# Patient Record
Sex: Male | Born: 1985 | Race: Black or African American | Hispanic: No | Marital: Single | Smoking: Never smoker
Health system: Southern US, Community
[De-identification: ages and names within clinical notes are randomized; demographics above are authoritative.]

## PROBLEM LIST (undated history)

## (undated) ENCOUNTER — Emergency Department (HOSPITAL_BASED_OUTPATIENT_CLINIC_OR_DEPARTMENT_OTHER): Admission: EM | Payer: BC Managed Care – PPO | Source: Home / Self Care

## (undated) DIAGNOSIS — M549 Dorsalgia, unspecified: Secondary | ICD-10-CM

## (undated) DIAGNOSIS — M542 Cervicalgia: Secondary | ICD-10-CM

## (undated) DIAGNOSIS — M2550 Pain in unspecified joint: Secondary | ICD-10-CM

## (undated) DIAGNOSIS — R200 Anesthesia of skin: Secondary | ICD-10-CM

## (undated) DIAGNOSIS — G4733 Obstructive sleep apnea (adult) (pediatric): Secondary | ICD-10-CM

## (undated) DIAGNOSIS — M6208 Separation of muscle (nontraumatic), other site: Secondary | ICD-10-CM

## (undated) DIAGNOSIS — E538 Deficiency of other specified B group vitamins: Secondary | ICD-10-CM

## (undated) DIAGNOSIS — F419 Anxiety disorder, unspecified: Secondary | ICD-10-CM

## (undated) DIAGNOSIS — R238 Other skin changes: Secondary | ICD-10-CM

## (undated) DIAGNOSIS — K219 Gastro-esophageal reflux disease without esophagitis: Secondary | ICD-10-CM

## (undated) DIAGNOSIS — R202 Paresthesia of skin: Secondary | ICD-10-CM

## (undated) DIAGNOSIS — M797 Fibromyalgia: Secondary | ICD-10-CM

## (undated) DIAGNOSIS — G894 Chronic pain syndrome: Secondary | ICD-10-CM

## (undated) DIAGNOSIS — F32A Depression, unspecified: Secondary | ICD-10-CM

## (undated) DIAGNOSIS — S93402A Sprain of unspecified ligament of left ankle, initial encounter: Secondary | ICD-10-CM

## (undated) HISTORY — DX: Depression, unspecified: F32.A

## (undated) HISTORY — DX: Sprain of unspecified ligament of left ankle, initial encounter: S93.402A

## (undated) HISTORY — DX: Gastro-esophageal reflux disease without esophagitis: K21.9

## (undated) HISTORY — PX: ESOPHAGOGASTRODUODENOSCOPY: SHX1529

## (undated) HISTORY — DX: Chronic pain syndrome: G89.4

## (undated) HISTORY — DX: Fibromyalgia: M79.7

## (undated) HISTORY — DX: Cervicalgia: M54.2

## (undated) HISTORY — DX: Obstructive sleep apnea (adult) (pediatric): G47.33

## (undated) HISTORY — DX: Separation of muscle (nontraumatic), other site: M62.08

## (undated) HISTORY — DX: Dorsalgia, unspecified: M54.9

## (undated) HISTORY — DX: Anxiety disorder, unspecified: F41.9

## (undated) HISTORY — DX: Paresthesia of skin: R20.2

## (undated) HISTORY — DX: Pain in unspecified joint: M25.50

## (undated) HISTORY — DX: Deficiency of other specified B group vitamins: E53.8

## (undated) HISTORY — DX: Other skin changes: R23.8

## (undated) HISTORY — PX: OTHER SURGICAL HISTORY: SHX169

## (undated) HISTORY — DX: Anesthesia of skin: R20.0

---

## 2009-11-15 DIAGNOSIS — B360 Pityriasis versicolor: Secondary | ICD-10-CM | POA: Insufficient documentation

## 2009-11-18 DIAGNOSIS — H6120 Impacted cerumen, unspecified ear: Secondary | ICD-10-CM | POA: Insufficient documentation

## 2009-11-30 DIAGNOSIS — E559 Vitamin D deficiency, unspecified: Secondary | ICD-10-CM | POA: Insufficient documentation

## 2012-10-11 ENCOUNTER — Encounter: Payer: Self-pay | Admitting: Family Medicine

## 2012-10-11 ENCOUNTER — Ambulatory Visit (INDEPENDENT_AMBULATORY_CARE_PROVIDER_SITE_OTHER): Payer: Self-pay | Admitting: Family Medicine

## 2012-10-11 VITALS — BP 102/76 | HR 98 | Temp 97.6°F | Ht 71.0 in | Wt 190.0 lb

## 2012-10-11 DIAGNOSIS — L989 Disorder of the skin and subcutaneous tissue, unspecified: Secondary | ICD-10-CM

## 2012-10-11 DIAGNOSIS — Z7689 Persons encountering health services in other specified circumstances: Secondary | ICD-10-CM

## 2012-10-11 DIAGNOSIS — E559 Vitamin D deficiency, unspecified: Secondary | ICD-10-CM

## 2012-10-11 DIAGNOSIS — L659 Nonscarring hair loss, unspecified: Secondary | ICD-10-CM

## 2012-10-11 DIAGNOSIS — Z7189 Other specified counseling: Secondary | ICD-10-CM

## 2012-10-11 LAB — LIPID PANEL
LDL Cholesterol: 66 mg/dL (ref 0–99)
Total CHOL/HDL Ratio: 3
Triglycerides: 68 mg/dL (ref 0.0–149.0)

## 2012-10-11 LAB — HEMOGLOBIN A1C: Hgb A1c MFr Bld: 4.6 % (ref 4.6–6.5)

## 2012-10-11 NOTE — Patient Instructions (Addendum)
-  We have ordered labs or studies at this visit. It can take up to 1-2 weeks for results and processing. We will contact you with instructions IF your results are abnormal. Normal results will be released to your [facility]. If you have not heard from [place] or can not find your results in [facility] in 2 weeks please contact our office.  -We placed a referral for you to DERMATOLOGY as discussed. It usually takes about 1-2 weeks to process and schedule this referral. If you have not heard from [place] regarding this appointment in 2 weeks please contact our office.   -PLEASE SIGN UP FOR MYCHART TODAY   We recommend the following healthy lifestyle measures: - eat a healthy diet consisting of lots of vegetables, fruits, beans, nuts, seeds, healthy meats such as white chicken and fish and whole grains.  - avoid fried foods, fast food, processed foods, sodas, red meet and other fattening foods.  - get a least 150 minutes of aerobic exercise per week.   Follow up in: schedule physical sometime in next few months

## 2012-10-11 NOTE — Progress Notes (Signed)
Chief Complaint  Patient presents with  . Establish Care    HPI:  Marcus Bullock is here to establish care. New to [place]. 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 Home Situation: lives with father Spiritual Beliefs: christian  Has the following chronic problems and concerns today:  Hair Loss: -has started to go bald a little over the last few years -father went bald early -denies: heat or cold intolerance, fevers, weight loss or change, skin issues  Lump on chest: -has been there at least 7-10 years since puberty -has not changed, occ painful, no pruritis -located on skin on L chest  Hx Vit D def: -very low in the past  Other Providers: -none  Health Maintenance: -refused flu vaccine, had tdap in 2010  ROS: See pertinent positives and negatives per HPI.  History reviewed. No pertinent past medical history.  Family History  Problem Relation Age of Onset  . Alcohol abuse Father   . Heart disease Maternal Grandmother   . Cancer Paternal Grandmother     breast    History   Social History  . Marital Status: Single    Spouse Name: N/A    Number of Children: N/A  . Years of Education: N/A   Social History Main Topics  . Smoking status: Never Smoker   . Smokeless tobacco: None  . Alcohol Use: Yes     Comment: under safe drinking levels  . Drug Use: Yes    Special: Marijuana  . Sexually Active: Yes     Comment: male partner   Other Topics Concern  . None   Social History Narrative  . None    Current outpatient prescriptions:NON FORMULARY, Tru-Athelete Creatine Supplement, Disp: , Rfl:   EXAM:  Filed Vitals:   10/11/12 0807  BP: 102/76  Pulse: 98  Temp: 97.6 F (36.4 C)    Body mass index is 26.50 kg/(m^2).  GENERAL: vitals reviewed and listed above, alert, oriented, appears well hydrated and in no acute distress  HEENT: atraumatic, conjunttiva clear, no obvious  abnormalities on inspection of external nose and ears, normal male pattern hair loss  NECK: no obvious masses on inspection  LUNGS: clear to auscultation bilaterally, no wheezes, rales or rhonchi, good air movement  CV: HRRR, no peripheral edema  SKIN: mildly hyperpigmented dense raised macules-plaques above L areola, no scalling  MS: moves all extremities without noticeable abnormality  PSYCH: pleasant and cooperative, no obvious depression or anxiety  ASSESSMENT AND PLAN:  Discussed the following assessment and plan:  1. Vitamin D deficiency  Vitamin D, 25-hydroxy  2. Establishing care with new doctor, encounter for  Lipid Panel, Hemoglobin A1c  3. Skin lesion  -likely benign  Pt would like to see derm, referral placed  4. Hair loss  -normal balding Discussed tx options, pt would like to hold of on these   -We reviewed the PMH, PSH, FH, SH, Meds and Allergies. -We provided refills for any medications we will prescribe as needed. -We addressed current concerns per orders and patient instructions. -We have asked for records for pertinent exams, studies, vaccines and notes from previous providers. -We have advised patient to follow up per instructions below. -Influenza vaccine given today  -Patient advised to return or notify a doctor immediately if symptoms worsen or persist or new concerns arise.  Patient Instructions  -We have ordered labs or studies at this visit. It can take up to 1-2 weeks  for results and processing. We will contact you with instructions IF your results are abnormal. Normal results will be released to your [facility]. If you have not heard from [place] or can not find your results in [facility] in 2 weeks please contact our office.  -We placed a referral for you to DERMATOLOGY as discussed. It usually takes about 1-2 weeks to process and schedule this referral. If you have not heard from [place] regarding this appointment in 2 weeks please contact our office.   -PLEASE  SIGN UP FOR MYCHART TODAY   We recommend the following healthy lifestyle measures: - eat a healthy diet consisting of lots of vegetables, fruits, beans, nuts, seeds, healthy meats such as white chicken and fish and whole grains.  - avoid fried foods, fast food, processed foods, sodas, red meet and other fattening foods.  - get a least 150 minutes of aerobic exercise per week.   Follow up in: schedule physical sometime in next few months      Sonia Bromell R.

## 2012-10-12 LAB — VITAMIN D 25 HYDROXY (VIT D DEFICIENCY, FRACTURES): Vit D, 25-Hydroxy: 32 ng/mL (ref 30–89)

## 2012-10-14 ENCOUNTER — Telehealth: Payer: Self-pay | Admitting: Family Medicine

## 2012-10-14 NOTE — Telephone Encounter (Signed)
Left a message for return call.  

## 2012-10-14 NOTE — Telephone Encounter (Signed)
Labs look good.

## 2012-10-14 NOTE — Telephone Encounter (Signed)
Left a detailed message for patient at cell phone number that labs look okay.

## 2012-11-12 ENCOUNTER — Encounter: Payer: Self-pay | Admitting: Family Medicine

## 2012-11-12 DIAGNOSIS — Z0289 Encounter for other administrative examinations: Secondary | ICD-10-CM

## 2012-11-12 NOTE — Progress Notes (Signed)
No show  This encounter was created in error - please disregard.

## 2012-11-22 ENCOUNTER — Encounter: Payer: Self-pay | Admitting: Family Medicine

## 2012-11-22 ENCOUNTER — Ambulatory Visit (INDEPENDENT_AMBULATORY_CARE_PROVIDER_SITE_OTHER): Payer: BC Managed Care – PPO | Admitting: Family Medicine

## 2012-11-22 VITALS — BP 122/80 | HR 98 | Temp 98.7°F | Ht 71.0 in | Wt 192.0 lb

## 2012-11-22 DIAGNOSIS — Z0000 Encounter for general adult medical examination without abnormal findings: Secondary | ICD-10-CM

## 2012-11-22 NOTE — Progress Notes (Signed)
Chief Complaint  Patient presents with  . Annual Exam  . upper rib pain and tenderness    on left side     HPI:  Here for Physical Exam:  Concerns: did a bunch of push ups last week and then had sharp pain in intercostal muscle in one point - now resolved  Diabetes/lipid screening: done recently and normal  Diet and Exercise: exercising 3-4 times per week, eating healthy  Vaccines: UTD except flu - refused  STI testing: wants this today for HIV, GC/C and RPR - does not have any concerns or symptoms put wants screening  Depression: none  Substance Abuse: see social hx  ROS: See pertinent positives and negatives per HPI. Denies: CP, SOB, DOE, dizziness, changes in bowels, dysuria, vision changes, HAs, weight loss, fevers, night sweat, bleeding  History reviewed. No pertinent past medical history.  Family History  Problem Relation Age of Onset  . Alcohol abuse Father   . Heart disease Maternal Grandmother   . Cancer Paternal Grandmother     breast    History   Social History  . Marital Status: Single    Spouse Name: N/A    Number of Children: N/A  . Years of Education: N/A   Social History Main Topics  . Smoking status: Never Smoker   . Smokeless tobacco: None  . Alcohol Use: Yes     Comment: under safe drinking levels  . Drug Use: Yes    Special: Marijuana  . Sexually Active: Yes     Comment: male partner   Other Topics Concern  . 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  GENERAL: vitals reviewed and listed above, alert, oriented, appears well hydrated and in no acute distress  HEENT: atraumatic, conjunttiva clear, no obvious  abnormalities on inspection of external nose and ears  NECK: no obvious masses on inspection  LUNGS: clear to auscultation bilaterally, no wheezes, rales or rhonchi, good air movement  CV: HRRR, no peripheral edema  GU: refused  SKIN: small hyperpigmented patch on L breast (he is seeing derm for this)  MS: moves all extremities without noticeable abnormality  PSYCH: pleasant and cooperative, no obvious depression or anxiety  ASSESSMENT AND PLAN:  Discussed the following assessment and plan:  Encounter for preventive health examination - Plan: HIV Antibody, RPR, GC/Chlamydia Amp Probe, Urine   -discussed all level A and B USPSHTF preventive measures -STI screening per labs -advised continued exercise and healthy diet -discussed safe drinking levels, substance use, safe sex -follow up in 1 year or if needed   There are no Patient Instructions on file for this visit.   Kriste Basque R.

## 2012-11-23 LAB — HIV ANTIBODY (ROUTINE TESTING W REFLEX): HIV: NONREACTIVE

## 2012-11-23 LAB — RPR

## 2012-11-24 ENCOUNTER — Telehealth: Payer: Self-pay | Admitting: Family Medicine

## 2012-11-24 NOTE — Telephone Encounter (Signed)
Labs were all negative 

## 2012-11-25 NOTE — Telephone Encounter (Signed)
Called and left a message on pt's cell phone number (on DPR) that all lab work was negative.

## 2012-12-24 ENCOUNTER — Ambulatory Visit: Payer: BC Managed Care – PPO | Admitting: Family Medicine

## 2013-01-10 ENCOUNTER — Ambulatory Visit (INDEPENDENT_AMBULATORY_CARE_PROVIDER_SITE_OTHER): Payer: BC Managed Care – PPO | Admitting: Family Medicine

## 2013-01-10 ENCOUNTER — Encounter: Payer: Self-pay | Admitting: Family Medicine

## 2013-01-10 VITALS — BP 118/78 | HR 104 | Temp 97.7°F | Wt 178.0 lb

## 2013-01-10 DIAGNOSIS — J309 Allergic rhinitis, unspecified: Secondary | ICD-10-CM

## 2013-01-10 DIAGNOSIS — J329 Chronic sinusitis, unspecified: Secondary | ICD-10-CM

## 2013-01-10 MED ORDER — FLUTICASONE PROPIONATE 50 MCG/ACT NA SUSP: 2.0000 | Freq: Every day | NASAL | Status: DC

## 2013-01-10 MED ORDER — AMOXICILLIN 875 MG PO TABS: 875.0000 mg | ORAL_TABLET | Freq: Two times a day (BID) | ORAL | Status: DC

## 2013-01-10 NOTE — Patient Instructions (Addendum)
-  take all of the antibiotic as instructed -As we discussed, we have prescribed a new medication for you at this appointment. We discussed the common and serious potential adverse effects of this medication and you can review these and more with the pharmacist when you pick up your medication.  Please follow the instructions for use carefully and notify us immediately if you have any problems taking this medication.  For allergies: -zyrtec daily -flonase 2 sprays each nostril daily for 1 month, then 1 spray each nostril daily  Follow up in 1 month or sooner if concerns

## 2013-01-10 NOTE — Progress Notes (Signed)
Chief Complaint  Patient presents with  . 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  No past medical history on file.  Family History  Problem Relation Age of Onset  . Alcohol abuse Father   . Heart disease Maternal Grandmother   . Cancer Paternal Grandmother     breast    History   Social History  . Marital Status: Single    Spouse Name: N/A    Number of Children: N/A  . Years of Education: N/A   Social History Main Topics  . Smoking status: Never Smoker   . Smokeless tobacco: None  . Alcohol Use: Yes     Comment: under safe drinking levels  . Drug Use: Yes    Special: Marijuana  . Sexually Active: Yes     Comment: male partner   Other Topics Concern  . 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  GENERAL: vitals reviewed and listed above, alert, oriented, appears well hydrated and in no acute  distress  HEENT: atraumatic, conjunttiva clear, no obvious abnormalities on inspection of external nose and ears, normal appearance of ear canals and TMs, white nasal congestion on R with narrow nasal passage from swollen pale boggy turbinates, mild post oropharyngeal erythema with PND and cobblestoning, no tonsillar edema or exudate, no sinus TTP  NECK: no obvious masses on inspection  LUNGS: clear to auscultation bilaterally, no wheezes, rales or rhonchi, good air movement  CV: HRRR, no peripheral edema  MS: moves all extremities without noticeable abnormality  PSYCH: pleasant and cooperative, no obvious depression or anxiety  ASSESSMENT AND PLAN:  Discussed the following assessment and plan:  Sinusitis - Plan: amoxicillin (AMOXIL) 875 MG tablet  Allergic rhinitis - Plan: fluticasone (FLONASE) 50 MCG/ACT nasal spray  -AR symptoms and findings on exam, also with possible secondary max sinusitis. Will tx allergies with daily zyrtec and flonase and sinusitis with amoxicillin. Follow up in one month or sooner is sinus pain persists. -Patient advised to return or notify a doctor immediately if symptoms worsen or persist or new concerns arise.  Patient Instructions  -take all of the antibiotic as instructed -As we discussed, we have prescribed a new medication for you at this appointment. We discussed the common and serious potential adverse effects of this medication and  you can review these and more with the pharmacist when you pick up your medication.  Please follow the instructions for use carefully and notify us immediately if you have any problems taking this medication.  For allergies: -zyrtec daily -flonase 2 sprays each nostril daily for 1 month, then 1 spray each nostril daily  Follow up in 1 month or sooner if concerns     Kriste Basque R.

## 2013-02-07 ENCOUNTER — Ambulatory Visit: Payer: BC Managed Care – PPO | Admitting: Family Medicine

## 2013-09-09 ENCOUNTER — Ambulatory Visit (INDEPENDENT_AMBULATORY_CARE_PROVIDER_SITE_OTHER): Payer: BC Managed Care – PPO | Admitting: Family Medicine

## 2013-09-09 ENCOUNTER — Encounter: Payer: Self-pay | Admitting: Family Medicine

## 2013-09-09 VITALS — BP 120/80 | Temp 98.0°F | Wt 185.0 lb

## 2013-09-09 DIAGNOSIS — J399 Disease of upper respiratory tract, unspecified: Secondary | ICD-10-CM

## 2013-09-09 DIAGNOSIS — L309 Dermatitis, unspecified: Secondary | ICD-10-CM

## 2013-09-09 DIAGNOSIS — J398 Other specified diseases of upper respiratory tract: Secondary | ICD-10-CM

## 2013-09-09 DIAGNOSIS — L259 Unspecified contact dermatitis, unspecified cause: Secondary | ICD-10-CM

## 2013-09-09 NOTE — Patient Instructions (Signed)
INSTRUCTIONS FOR UPPER RESPIRATORY INFECTION:  -plenty of rest and fluids  -nasal saline wash 2-3 times daily (use prepackaged nasal saline or bottled/distilled water if making your own)   -can use sinex or afrin nasal spray for drainage and nasal congestion - but do NOT use longer then 3-4 days  -can use tylenol or ibuprofen as directed for aches and sorethroat  -in the winter time, using a humidifier at night is helpful (please follow cleaning instructions)  -if you are taking a cough medication - use only as directed, may also try a teaspoon of honey to coat the throat and throat lozenges  -for sore throat, salt water gargles can help  -all hypoallergenic soaps, bodywash and detergent - CERAVE cream (in a tub) use after every shower  -take zyrtec every night  -follow up if you have fevers, facial pain, tooth pain, difficulty breathing or are worsening or not getting better in 5-7 days

## 2013-09-09 NOTE — Progress Notes (Signed)
Chief Complaint  Patient presents with  . Sinus Problem    cloudy, fatigue, congestion, no appetite, itchy     HPI:  Acute visit for:  Sinus Issues: -started Friday, not getting better -nasal congestion, sneezing, sinus pressure - not actually pain, decreased appetite, felt like low grade fever, a little lightheaded, tired, earaches bilat, body aches -denies: fever>100, SOB, NVD, Ebola or flu exposure, sore throat -thought this was allergies so took zyrtec, tylenol cold and sinus, musinex   Skin Issues: -hx of skin issues -tingling sensation in skin when exercises -tx with hypoallergenic products and zyrtec - this helped but now he is back to used scented products and has returned -skin is dry -gets worse  ROS: See pertinent positives and negatives per HPI.  No past medical history on file.  No past surgical history on file.  Family History  Problem Relation Age of Onset  . Alcohol abuse Father   . Heart disease Maternal Grandmother   . Cancer Paternal Grandmother     breast    History   Social History  . Marital Status: Single    Spouse Name: N/A    Number of Children: N/A  . Years of Education: N/A   Social History Main Topics  . Smoking status: Never Smoker   . Smokeless tobacco: None  . Alcohol Use: Yes     Comment: under safe drinking levels  . Drug Use: Yes    Special: Marijuana  . Sexual Activity: Yes     Comment: male partner   Other Topics Concern  . 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  GENERAL: vitals reviewed and listed  above, alert, oriented, appears well hydrated and in no acute distress  HEENT: atraumatic, conjunttiva clear, no obvious abnormalities on inspection of external nose and ears, normal appearance of ear canals and TMs, clear nasal congestion, mild post oropharyngeal erythema with PND, no tonsillar edema or exudate, no sinus TTP  NECK: no obvious masses on inspection  LUNGS: clear to auscultation bilaterally, no wheezes, rales or rhonchi, good air movement  CV: HRRR, no peripheral edema  MS: moves all extremities without noticeable abnormality  PSYCH: pleasant and cooperative, no obvious depression or anxiety  ASSESSMENT AND PLAN:  Discussed the following assessment and plan:  Upper respiratory disease  Eczema  -we discussed possible serious and likely etiologies, workup and treatment, treatment risks and return precautions - VURI, underlying uncontrolled allergic rhinits and eczema likely. -after this discussion, Marcus Bullock opted for treatments per instructions. -follow up advised as needed -of course, we advised Marcus Bullock  to return or notify a doctor immediately if symptoms worsen or persist or new concerns arise.  .  -Patient advised to return or notify a doctor immediately if symptoms worsen or persist or new concerns arise.  Patient Instructions  INSTRUCTIONS FOR UPPER RESPIRATORY INFECTION:  -plenty of rest and fluids  -nasal saline wash 2-3 times daily (use prepackaged nasal saline or bottled/distilled water if making your own)   -  can use sinex or afrin nasal spray for drainage and nasal congestion - but do NOT use longer then 3-4 days  -can use tylenol or ibuprofen as directed for aches and sorethroat  -in the winter time, using a humidifier at night is helpful (please follow cleaning instructions)  -if you are taking a cough medication - use only as directed, may also try a teaspoon of honey to coat the throat and throat lozenges  -for sore throat, salt water  gargles can help  -all hypoallergenic soaps, bodywash and detergent - CERAVE cream (in a tub) use after every shower  -take zyrtec every night  -follow up if you have fevers, facial pain, tooth pain, difficulty breathing or are worsening or not getting better in 5-7 days        KIM, HANNAH R.

## 2013-09-09 NOTE — Progress Notes (Signed)
Pre visit review using our clinic review tool, if applicable. No additional management support is needed unless otherwise documented below in the visit note. 

## 2013-10-13 ENCOUNTER — Encounter: Payer: BC Managed Care – PPO | Admitting: Family Medicine

## 2013-10-13 ENCOUNTER — Encounter: Payer: Self-pay | Admitting: Family Medicine

## 2013-10-13 ENCOUNTER — Ambulatory Visit (INDEPENDENT_AMBULATORY_CARE_PROVIDER_SITE_OTHER): Payer: BC Managed Care – PPO | Admitting: Family Medicine

## 2013-10-13 VITALS — BP 128/70 | HR 132 | Temp 98.7°F | Wt 190.0 lb

## 2013-10-13 DIAGNOSIS — L299 Pruritus, unspecified: Secondary | ICD-10-CM

## 2013-10-13 DIAGNOSIS — F411 Generalized anxiety disorder: Secondary | ICD-10-CM

## 2013-10-13 LAB — CBC WITH DIFFERENTIAL/PLATELET
BASOS ABS: 0 10*3/uL (ref 0.0–0.1)
Basophils Relative: 0.6 % (ref 0.0–3.0)
EOS PCT: 1.6 % (ref 0.0–5.0)
Eosinophils Absolute: 0.1 10*3/uL (ref 0.0–0.7)
HCT: 44.5 % (ref 39.0–52.0)
Hemoglobin: 14.8 g/dL (ref 13.0–17.0)
Lymphocytes Relative: 24.7 % (ref 12.0–46.0)
Lymphs Abs: 1.4 10*3/uL (ref 0.7–4.0)
MCHC: 33.2 g/dL (ref 30.0–36.0)
MCV: 89 fl (ref 78.0–100.0)
MONO ABS: 0.6 10*3/uL (ref 0.1–1.0)
Monocytes Relative: 10 % (ref 3.0–12.0)
NEUTROS PCT: 63.1 % (ref 43.0–77.0)
Neutro Abs: 3.7 10*3/uL (ref 1.4–7.7)
PLATELETS: 224 10*3/uL (ref 150.0–400.0)
RBC: 5 Mil/uL (ref 4.22–5.81)
RDW: 12.7 % (ref 11.5–14.6)
WBC: 5.8 10*3/uL (ref 4.5–10.5)

## 2013-10-13 LAB — VITAMIN B12: VITAMIN B 12: 448 pg/mL (ref 211–911)

## 2013-10-13 LAB — TSH: TSH: 1.36 u[IU]/mL (ref 0.35–5.50)

## 2013-10-13 LAB — T4, FREE: Free T4: 0.85 ng/dL (ref 0.60–1.60)

## 2013-10-13 NOTE — Progress Notes (Signed)
Pre visit review using our clinic review tool, if applicable. No additional management support is needed unless otherwise documented below in the visit note. 

## 2013-10-13 NOTE — Progress Notes (Signed)
Error   This encounter was created in error - please disregard. 

## 2013-10-13 NOTE — Progress Notes (Signed)
Chief Complaint  Patient presents with  . Pruritis    HPI:  Itchy skin: -late cancelled appt this morning -has had this condition for many years - worse in the winter -dry itchy skin especially when sweats or when anxious - saw derm and given doxepin - not workin -wants to see allergist -has gone to hypoallergenic regimen - and better, but still has a flare about 2-3 days per week for 1 hour or so -also worse with regular deodorant -does feel anxious all the time -denies fevers, malaise, weight loss, other symptoms   ROS: See pertinent positives and negatives per HPI.  No past medical history on file.  No past surgical history on file.  Family History  Problem Relation Age of Onset  . Alcohol abuse Father   . Heart disease Maternal Grandmother   . Cancer Paternal Grandmother     breast    History   Social History  . Marital Status: Single    Spouse Name: N/A    Number of Children: N/A  . Years of Education: N/A   Social History Main Topics  . Smoking status: Never Smoker   . Smokeless tobacco: None  . Alcohol Use: Yes     Comment: under safe drinking levels  . Drug Use: Yes    Special: Marijuana  . Sexual Activity: Yes     Comment: male partner   Other Topics Concern  . 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  GENERAL: vitals reviewed and listed above,  alert, oriented, appears well hydrated and in no acute distress  HEENT: atraumatic, conjunttiva clear, no obvious abnormalities on inspection of external nose and ears  NECK: no obvious masses on inspection  LUNGS: clear to auscultation bilaterally, no wheezes, rales or rhonchi, good air movement  CV: HRRR, no peripheral edema  MS: moves all extremities without noticeable abnormality  PSYCH: pleasant and cooperative, no obvious depression or anxiety  ASSESSMENT AND PLAN:  Discussed the following assessment and plan:  Itchy skin - Plan: CBC with Differential, Vitamin B12, Vitamin D, 25-hydroxy, CMP with eGFR, TSH, T4, Free  Generalized anxiety disorder  -suspect eczema and reaction to stress, possible allergies - he is going to see allergist -basic labs - but doubt systemic illness as has been going on for many years -advised cont hypoallergenic regimen since this is helpful and keep journal to look for other triggers -he is to follow up with derm -follow up with me in 2 months -if persists will perhaps try SSRI given seems to have mild GAD and stress and anxiety worsen symptoms -Patient advised to return or notify a doctor immediately if symptoms worsen or persist or new concerns arise.  There are no Patient Instructions on file for this visit.  Colin Benton R.

## 2013-10-14 ENCOUNTER — Ambulatory Visit: Payer: BC Managed Care – PPO | Admitting: Family Medicine

## 2013-10-14 LAB — COMPLETE METABOLIC PANEL WITH GFR
ALK PHOS: 52 U/L (ref 39–117)
ALT: 20 U/L (ref 0–53)
AST: 19 U/L (ref 0–37)
Albumin: 4.5 g/dL (ref 3.5–5.2)
BILIRUBIN TOTAL: 0.4 mg/dL (ref 0.3–1.2)
BUN: 9 mg/dL (ref 6–23)
CHLORIDE: 101 meq/L (ref 96–112)
CO2: 30 mEq/L (ref 19–32)
CREATININE: 1.11 mg/dL (ref 0.50–1.35)
Calcium: 10 mg/dL (ref 8.4–10.5)
GFR, Est African American: >89 mL/min
GFR, Est Non African American: >89 mL/min
Glucose, Bld: 92 mg/dL (ref 70–99)
Potassium: 4.7 mEq/L (ref 3.5–5.3)
Sodium: 137 mEq/L (ref 135–145)
Total Protein: 7.4 g/dL (ref 6.0–8.3)

## 2013-10-14 LAB — VITAMIN D 25 HYDROXY (VIT D DEFICIENCY, FRACTURES): VIT D 25 HYDROXY: 24 ng/mL — AB (ref 30–89)

## 2013-10-17 ENCOUNTER — Encounter: Payer: Self-pay | Admitting: Family Medicine

## 2013-10-17 NOTE — Progress Notes (Signed)
Received office notes from [facility] from visit on 10/15/2013.  Skin testing standard panel of enriomental allergens revelaed no sigificant results on the epicutaneous level.  Further intradermals revealed no positive reactions.  Continue Zyrtec, hydroxyzine, cerave cream, and Fluticasone, ranitidine.  Stop Doxepin.  Follow up in 3 months.

## 2014-10-01 ENCOUNTER — Other Ambulatory Visit (HOSPITAL_COMMUNITY)
Admission: RE | Admit: 2014-10-01 | Discharge: 2014-10-01 | Disposition: A | Discharge status: Home / Self Care | Payer: BC Managed Care – PPO | Source: Ambulatory Visit | Attending: Family Medicine | Admitting: Family Medicine

## 2014-10-01 ENCOUNTER — Encounter: Payer: Self-pay | Admitting: Family Medicine

## 2014-10-01 ENCOUNTER — Ambulatory Visit (INDEPENDENT_AMBULATORY_CARE_PROVIDER_SITE_OTHER): Payer: BC Managed Care – PPO | Admitting: Family Medicine

## 2014-10-01 VITALS — BP 130/78 | HR 100 | Temp 98.0°F | Ht 70.25 in | Wt 194.5 lb

## 2014-10-01 DIAGNOSIS — Z23 Encounter for immunization: Secondary | ICD-10-CM

## 2014-10-01 DIAGNOSIS — Z113 Encounter for screening for infections with a predominantly sexual mode of transmission: Secondary | ICD-10-CM

## 2014-10-01 DIAGNOSIS — Z0000 Encounter for general adult medical examination without abnormal findings: Secondary | ICD-10-CM

## 2014-10-01 NOTE — Progress Notes (Signed)
HPI:  Here for CPE:  -Concerns and/or follow up today: none  Chronic urticaria: -seeing allergist, has seen derm as well -doing better  -Diet: has improved, eating healthy  -Exercise: regular exercise - gym, cardio  -Diabetes and Dyslipidemia Screening: done  -Hx of HTN: no  -Vaccines: UTD  -sexual activity: several new male partners in the last few years, using condoms  -wants STI testing, Hep C screening (if born [id number]): yes - HIV, RPR, CG/Chlam, Trich  -FH colon or prstate ca: see FH Last colon cancer screening: n/a Last prostate ca screening: n/a  -Alcohol, Tobacco, drug use: see social history  Review of Systems - no fevers, unintentional weight loss, vision loss, hearing loss, chest pain, sob, hemoptysis, melena, hematochezia, hematuria, genital discharge, changing or concerning skin lesions, bleeding, bruising, loc, thoughts of self harm or SI  No past medical history on file.  No past surgical history on file.  Family History  Problem Relation Age of Onset  . Alcohol abuse Father   . Heart disease Maternal Grandmother   . Cancer Paternal Grandmother     breast    History   Social History  . Marital Status: Single    Spouse Name: N/A    Number of Children: N/A  . Years of Education: N/A   Social History Main Topics  . Smoking status: Never Smoker   . Smokeless tobacco: None  . Alcohol Use: Yes     Comment: under safe drinking levels  . Drug Use: Yes    Special: Marijuana  . Sexual Activity: Yes     Comment: male partner   Other Topics Concern  . 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      Home Situation: lives with father      Spiritual Beliefs: christian          Current outpatient prescriptions: cetirizine (ZYRTEC) 10 MG tablet, Take 10 mg by mouth daily., Disp: , Rfl: ;  fluticasone (FLONASE) 50 MCG/ACT nasal spray, Place 2 sprays into the nose daily., Disp: 16 g, Rfl: 6;  hydrOXYzine (ATARAX/VISTARIL) 25 MG  tablet, Take 25 mg by mouth 2 (two) times daily., Disp: , Rfl: ;  ranitidine (ZANTAC) 150 MG tablet, Take 150 mg by mouth 2 (two) times daily., Disp: , Rfl:   EXAM:  Filed Vitals:   10/01/14 0822  BP: 130/78  Pulse: 100  Temp: 98 F (36.7 C)  TempSrc: Oral  Height: 5' 10.25" (1.784 m)  Weight: 194 lb 8 oz (88.225 kg)    Estimated body mass index is 27.72 kg/(m^2) as calculated from the following:   Height as of this encounter: 5' 10.25" (1.784 m).   Weight as of this encounter: 194 lb 8 oz (88.225 kg).  GENERAL: vitals reviewed and listed below, alert, oriented, appears well hydrated and in no acute distress  HEENT: head atraumatic, PERRLA, normal appearance of eyes, ears, nose and mouth. moist mucus membranes.  NECK: supple, no masses or lymphadenopathy  LUNGS: clear to auscultation bilaterally, no rales, rhonchi or wheeze  CV: HRRR, no peripheral edema or cyanosis, normal pedal pulses  ABDOMEN: bowel sounds normal, soft, non tender to palpation, no masses, no rebound or guarding  GU: declined  RECTAL: refused  SKIN: no rash or abnormal lesions  MS: normal gait, moves all extremities normally  NEURO: CN II-XII grossly intact, normal muscle strength and sensation to light touch on extremities  PSYCH: normal affect, pleasant and cooperative  ASSESSMENT AND PLAN:  Discussed the following assessment and plan:  Visit for preventive health examination - Plan: HIV antibody (with reflex), RPR, GC/Chlamydia Amp Probe, Urine   -Discussed and advised all [place] preventive services health task force level A and B recommendations for age, sex and risks.  -Advised at least 150 minutes of exercise per week and a healthy diet low in saturated fats and sweets and consisting of fresh fruits and vegetables, lean meats such as fish and white chicken and whole grains.  -labs, studies and vaccines per orders this encounter   Patient advised to return to clinic immediately if symptoms  worsen or persist or new concerns.  Patient Instructions  BEFORE YOU LEAVE: -flu shot -labs  -We have ordered labs or studies at this visit. It can take up to 1-2 weeks for results and processing. We will contact you with instructions IF your results are abnormal. Normal results will be released to your [facility]. If you have not heard from us or can not find your results in [facility] in 2 weeks please contact our office.  -PLEASE SIGN UP FOR MYCHART TODAY   We recommend the following healthy lifestyle measures: - eat a healthy diet consisting of lots of vegetables, fruits, beans, nuts, seeds, healthy meats such as white chicken and fish and whole grains.  - avoid fried foods, fast food, processed foods, sodas, red meet and other fattening foods.  - get a least 150 minutes of aerobic exercise per week.       No Follow-up on file.   Kriste BasqueKIM, HANNAH R.

## 2014-10-01 NOTE — Addendum Note (Signed)
Addended by: Johnella MoloneyFUNDERBURK, Dynver Clemson A on: 10/01/2014 08:46 AM   Modules accepted: Orders

## 2014-10-01 NOTE — Addendum Note (Signed)
Addended by: Johnella MoloneyFUNDERBURK, Bethel Gaglio A on: 10/01/2014 09:59 AM   Modules accepted: Orders

## 2014-10-01 NOTE — Progress Notes (Signed)
Pre visit review using our clinic review tool, if applicable. No additional management support is needed unless otherwise documented below in the visit note. 

## 2014-10-01 NOTE — Patient Instructions (Signed)
BEFORE YOU LEAVE: -flu shot -labs  -We have ordered labs or studies at this visit. It can take up to 1-2 weeks for results and processing. We will contact you with instructions IF your results are abnormal. Normal results will be released to your [facility]. If you have not heard from us or can not find your results in [facility] in 2 weeks please contact our office.  -PLEASE SIGN UP FOR MYCHART TODAY   We recommend the following healthy lifestyle measures: - eat a healthy diet consisting of lots of vegetables, fruits, beans, nuts, seeds, healthy meats such as white chicken and fish and whole grains.  - avoid fried foods, fast food, processed foods, sodas, red meet and other fattening foods.  - get a least 150 minutes of aerobic exercise per week.

## 2014-10-02 LAB — RPR

## 2014-10-03 LAB — HIV ANTIBODY (ROUTINE TESTING W REFLEX): HIV 1&2 Ab, 4th Generation: NONREACTIVE

## 2014-10-05 ENCOUNTER — Telehealth: Payer: Self-pay | Admitting: Family Medicine

## 2014-10-05 LAB — GC/CHLAMYDIA PROBE AMP, URINE
CHLAMYDIA, SWAB/URINE, PCR: NEGATIVE
GC PROBE AMP, URINE: NEGATIVE

## 2014-10-05 LAB — URINE CYTOLOGY ANCILLARY ONLY: Trichomonas: NEGATIVE

## 2014-10-05 NOTE — Telephone Encounter (Signed)
Would like a call back about lab results

## 2014-10-06 NOTE — Telephone Encounter (Signed)
Patient informed. 

## 2014-10-26 ENCOUNTER — Telehealth: Payer: Self-pay | Admitting: Family Medicine

## 2014-10-26 MED ORDER — HYDROXYZINE HCL 25 MG PO TABS: 25.0000 mg | ORAL_TABLET | Freq: Two times a day (BID) | ORAL | Status: DC

## 2014-10-26 NOTE — Telephone Encounter (Signed)
Rx done and the pt was informed of the message below.  He also stated he was looking online at his diagnosis from the allergist and found this could be linked to thyroid problems which his mother has and questioned if his thyroid has been checked.  I advised him the TSH from 10/2013 was normal and he agreed.

## 2014-10-26 NOTE — Telephone Encounter (Signed)
He is already established with the allergist - so if they feel needs refilled prior to his appt they should be able to rx. If not ok to give limited 1 month supply. Thanks.

## 2014-10-26 NOTE — Telephone Encounter (Signed)
Pt request refill hydrOXYzine (ATARAX/VISTARIL) 25 MG tablet.  Pt usually gets from [facility] for skin condition, however pt cannot get appt until ,march and states dr kim aware of his situation. Would like to know if she will refill for him. [facility]. [address]

## 2014-12-17 ENCOUNTER — Ambulatory Visit (INDEPENDENT_AMBULATORY_CARE_PROVIDER_SITE_OTHER): Payer: BLUE CROSS/BLUE SHIELD | Admitting: Family Medicine

## 2014-12-17 ENCOUNTER — Encounter: Payer: Self-pay | Admitting: Family Medicine

## 2014-12-17 ENCOUNTER — Encounter: Payer: Self-pay | Admitting: *Deleted

## 2014-12-17 VITALS — BP 104/72 | HR 107 | Temp 100.9°F | Ht 70.25 in | Wt 194.6 lb

## 2014-12-17 DIAGNOSIS — J069 Acute upper respiratory infection, unspecified: Secondary | ICD-10-CM

## 2014-12-17 DIAGNOSIS — R6889 Other general symptoms and signs: Secondary | ICD-10-CM

## 2014-12-17 LAB — POCT INFLUENZA A/B
Influenza A, POC: NEGATIVE
Influenza B, POC: NEGATIVE

## 2014-12-17 MED ORDER — HYDROCODONE-HOMATROPINE 5-1.5 MG/5ML PO SYRP: 5.0000 mL | ORAL_SOLUTION | Freq: Three times a day (TID) | ORAL | Status: DC | PRN

## 2014-12-17 NOTE — Progress Notes (Signed)
HPI:  URI -started:yesterday, reports a little better today -symptoms:nasal congestion, sore throat, cough, body aches, fever -denies:fever, SOB, NVD, tooth pain -has tried: OTC cough and cold meds -sick contacts/travel/risks: denies flu exposure, tick exposure or or Ebola risks  ROS: See pertinent positives and negatives per HPI.  No past medical history on file.  No past surgical history on file.  Family History  Problem Relation Age of Onset  . Alcohol abuse Father   . Heart disease Maternal Grandmother   . Cancer Paternal Grandmother     breast    History   Social History  . Marital Status: Single    Spouse Name: N/A  . Number of Children: N/A  . Years of Education: N/A   Social History Main Topics  . Smoking status: Never Smoker   . Smokeless tobacco: Not on file  . Alcohol Use: Yes     Comment: under safe drinking levels  . Drug Use: Yes    Special: Marijuana  . Sexual Activity: Yes     Comment: male partner   Other Topics Concern  . 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  cetirizine (ZYRTEC) 10 MG tablet, Take 10 mg by mouth daily., Disp: , Rfl:  .  HYDROcodone-homatropine (HYCODAN) 5-1.5 MG/5ML syrup, Take 5 mLs by mouth every 8 (eight) hours as needed for cough., Disp: 120 mL, Rfl: 0  EXAM:  Filed Vitals:   12/17/14 1011  BP: 104/72  Pulse: 107  Temp: 100.9 F (38.3 C)    Body mass index is 27.73 kg/(m^2).  GENERAL: vitals reviewed and listed above, alert, oriented, appears well hydrated and in no acute distress  HEENT: atraumatic, conjunttiva clear, no obvious abnormalities on inspection of external nose and ears, normal appearance of ear canals and TMs, clear nasal congestion, mild post oropharyngeal erythema with PND, no tonsillar edema or exudate, no sinus TTP  NECK: no obvious masses on  inspection  LUNGS: clear to auscultation bilaterally, no wheezes, rales or rhonchi, good air movement  CV: HRRR, no peripheral edema  MS: moves all extremities without noticeable abnormality  PSYCH: pleasant and cooperative, no obvious depression or anxiety  ASSESSMENT AND PLAN:  Discussed the following assessment and plan:  Flu-like symptoms - Plan: POC Influenza A/B  Acute upper respiratory infection - Plan: HYDROcodone-homatropine (HYCODAN) 5-1.5 MG/5ML syrup  -given HPI and exam findings today, a serious bacterial infection or illness is unlikely. We discussed potential etiologies, with VURI, flu or flu like illness being most likely, and advised supportive care and monitoring. We discussed treatment side effects, likely course, antibiotic misuse, transmission, and signs of developing a serious illness. -rapid flu negative, discussed tamiflu but he opted even if had flu would not want to take  -of course, we advised to return or notify a doctor immediately if symptoms worsen or persist or new concerns arise.    Patient Instructions  INSTRUCTIONS FOR UPPER RESPIRATORY INFECTION:  -plenty of rest and fluids  -nasal saline wash 2-3 times daily (use prepackaged nasal saline or bottled/distilled water if making your own)   -clean nose with nasal saline before using the nasal steroid or sinex  -can use sinex nasal spray for drainage and nasal congestion - but do NOT use longer then 3-4 days  -can use  tylenol or ibuprofen as directed for aches and sorethroat  -in the winter time, using a humidifier at night is helpful (please follow cleaning instructions)  -if you are taking a cough medication - use only as directed, may also try a teaspoon of honey to coat the throat and throat lozenges  -for sore throat, salt water gargles can help  -follow up if you have fevers, facial pain, tooth pain, difficulty breathing or are worsening or not getting better i      Kriste Basque  R.

## 2014-12-17 NOTE — Patient Instructions (Signed)
INSTRUCTIONS FOR UPPER RESPIRATORY INFECTION:  -plenty of rest and fluids  -nasal saline wash 2-3 times daily (use prepackaged nasal saline or bottled/distilled water if making your own)   -clean nose with nasal saline before using the nasal steroid or sinex  -can use sinex nasal spray for drainage and nasal congestion - but do NOT use longer then 3-4 days  -can use tylenol or ibuprofen as directed for aches and sorethroat  -in the winter time, using a humidifier at night is helpful (please follow cleaning instructions)  -if you are taking a cough medication - use only as directed, may also try a teaspoon of honey to coat the throat and throat lozenges  -for sore throat, salt water gargles can help  -follow up if you have fevers, facial pain, tooth pain, difficulty breathing or are worsening or not getting better i

## 2014-12-17 NOTE — Progress Notes (Signed)
Pre visit review using our clinic review tool, if applicable. No additional management support is needed unless otherwise documented below in the visit note. 

## 2015-04-29 ENCOUNTER — Ambulatory Visit (INDEPENDENT_AMBULATORY_CARE_PROVIDER_SITE_OTHER): Payer: BLUE CROSS/BLUE SHIELD | Admitting: Family Medicine

## 2015-04-29 ENCOUNTER — Ambulatory Visit: Payer: BLUE CROSS/BLUE SHIELD | Admitting: Family Medicine

## 2015-04-29 ENCOUNTER — Ambulatory Visit (INDEPENDENT_AMBULATORY_CARE_PROVIDER_SITE_OTHER)
Admission: RE | Admit: 2015-04-29 | Discharge: 2015-04-29 | Disposition: A | Discharge status: Home / Self Care | Payer: BLUE CROSS/BLUE SHIELD | Source: Ambulatory Visit | Attending: Family Medicine | Admitting: Family Medicine

## 2015-04-29 ENCOUNTER — Encounter: Payer: Self-pay | Admitting: Family Medicine

## 2015-04-29 VITALS — BP 130/88 | HR 90 | Temp 98.5°F | Ht 70.25 in | Wt 187.2 lb

## 2015-04-29 DIAGNOSIS — M25571 Pain in right ankle and joints of right foot: Secondary | ICD-10-CM | POA: Diagnosis not present

## 2015-04-29 DIAGNOSIS — R238 Other skin changes: Secondary | ICD-10-CM

## 2015-04-29 DIAGNOSIS — L989 Disorder of the skin and subcutaneous tissue, unspecified: Secondary | ICD-10-CM

## 2015-04-29 NOTE — Patient Instructions (Addendum)
BEFORE YOU LEAVE: -peroneous tendon exercises -schedule follow up in 1 month -xray sheet  Go get xray  Do the exercises at least 4 days per week  Avoid strenuous activities that worsen pain but gradual start back to gentle exercise such as walking  Cerave cream twice daily and zyrtec once daily starting in October to see if this helps prevent the skin issues - follow up with dermatologist if this is not succesful

## 2015-04-29 NOTE — Progress Notes (Signed)
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  No past medical history on file.  No past surgical history on file.  Family History  Problem Relation Age of Onset  . Alcohol abuse Father   . Heart disease Maternal Grandmother   . Cancer Paternal Grandmother     breast    History   Social History  . Marital Status: Single    Spouse Name: N/A  . Number of Children: N/A  . Years of Education: N/A   Social History Main Topics  . Smoking status: Never Smoker   . Smokeless tobacco: Not on file  . Alcohol Use: Yes     Comment: under safe drinking levels  . Drug Use: Yes    Special: Marijuana  . Sexual Activity: Yes     Comment: male partner   Other Topics Concern  . 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  EXAM:  Filed Vitals:   04/29/15 1502  BP: 130/88  Pulse: 90  Temp: 98.5 F (36.9 C)    Body mass index is 26.68 kg/(m^2).  GENERAL: vitals reviewed and listed above, alert, oriented, appears well hydrated and in no acute distress  HEENT: atraumatic, conjunttiva clear,  no obvious abnormalities on inspection of external nose and ears  NECK: no obvious masses on inspection  MS: moves all extremities without noticeable abnormality -TTP in R peroneous tendons in lat/post ankle -normal inspection of both ankles, feet, legs -no bony TTP at this point -normal strength, ROM, sensation to light touch and cap refill -normal gait, neg talar tilt and drawer test  PSYCH: pleasant and cooperative, no obvious depression or anxiety  ASSESSMENT AND PLAN:  Discussed the following assessment and plan:  Right ankle pain -suspect peroneous strain -xrays given hx thought any fx likely healed at this point and no bony TTP -HEP, gradual return to activities, f/u 1 month  Skin irritation -cerave and zyrtec starting in October  -Patient advised to return or notify a doctor immediately if symptoms worsen or persist or new concerns arise.  Patient Instructions  BEFORE YOU LEAVE: -peroneous tendon exercises -schedule follow up in 1 month -xray sheet  Go get xray  Do the exercises at least 4 days per week  Avoid strenuous activities that worsen pain but gradual start back to gentle exercise such as walking  Cerave cream twice daily and zyrtec once daily starting in October to see if this helps prevent the skin issues - follow up with dermatologist if this is not succesful  Colin Benton R.

## 2015-04-29 NOTE — Progress Notes (Signed)
Pre visit review using our clinic review tool, if applicable. No additional management support is needed unless otherwise documented below in the visit note. 

## 2015-05-26 ENCOUNTER — Ambulatory Visit (INDEPENDENT_AMBULATORY_CARE_PROVIDER_SITE_OTHER): Payer: BLUE CROSS/BLUE SHIELD | Admitting: Family Medicine

## 2015-05-26 DIAGNOSIS — R69 Illness, unspecified: Secondary | ICD-10-CM

## 2015-05-26 NOTE — Progress Notes (Signed)
No show for acute visit

## 2015-11-23 ENCOUNTER — Encounter: Payer: Self-pay | Admitting: Family Medicine

## 2015-11-23 ENCOUNTER — Ambulatory Visit (INDEPENDENT_AMBULATORY_CARE_PROVIDER_SITE_OTHER): Payer: BC Managed Care – PPO | Admitting: Family Medicine

## 2015-11-23 ENCOUNTER — Other Ambulatory Visit (HOSPITAL_COMMUNITY)
Admission: RE | Admit: 2015-11-23 | Discharge: 2015-11-23 | Disposition: A | Discharge status: Home / Self Care | Payer: BC Managed Care – PPO | Source: Ambulatory Visit | Attending: Family Medicine | Admitting: Family Medicine

## 2015-11-23 VITALS — BP 118/78 | HR 67 | Temp 98.8°F | Ht 71.0 in | Wt 181.7 lb

## 2015-11-23 DIAGNOSIS — Z113 Encounter for screening for infections with a predominantly sexual mode of transmission: Secondary | ICD-10-CM | POA: Diagnosis not present

## 2015-11-23 DIAGNOSIS — Z0000 Encounter for general adult medical examination without abnormal findings: Secondary | ICD-10-CM

## 2015-11-23 DIAGNOSIS — Z111 Encounter for screening for respiratory tuberculosis: Secondary | ICD-10-CM | POA: Diagnosis not present

## 2015-11-23 DIAGNOSIS — Z23 Encounter for immunization: Secondary | ICD-10-CM | POA: Diagnosis not present

## 2015-11-23 LAB — HEMOGLOBIN A1C: Hgb A1c MFr Bld: 4.5 % — ABNORMAL LOW (ref 4.6–6.5)

## 2015-11-23 LAB — HDL CHOLESTEROL: HDL: 63.1 mg/dL (ref >39.00)

## 2015-11-23 LAB — CHOLESTEROL, TOTAL: CHOLESTEROL: 142 mg/dL (ref 0–200)

## 2015-11-23 NOTE — Patient Instructions (Signed)
Before you leave:  - flu vaccine  -TB skin test  - labs   -schedule physical in one year   -We have ordered labs or studies at this visit. It can take up to 1-2 weeks for results and processing. We will contact you with instructions IF your results are abnormal. Normal results will be released to your [facility]. If you have not heard from [place] or can not find your results in [facility] in 2 weeks please contact our office.  We recommend the following healthy lifestyle measures: - eat a healthy whole foods diet consisting of regular small meals composed of vegetables, fruits, beans, nuts, seeds, healthy meats such as white chicken and fish and whole grains.  - avoid sweets, white starchy foods, fried foods, fast food, processed foods, sodas, red meet and other fattening foods.  - get a least 150-300 minutes of aerobic exercise per week.

## 2015-11-23 NOTE — Progress Notes (Signed)
HPI:  Marcus Bullock is a pleasant 30 year old male here for his annual  Exam. He recently had a left ankle sprain that was treated in the urgent care and this is resolving. He has no new concerns or complaints otherwise today. He is happy that his skin itching resolved after he stopped using an antiperspirant.  -Concerns and/or follow up today: none  -Diet: variety of foods, balance and well rounded, larger portion sizes  -Exercise:  regular exercise  -Diabetes and Dyslipidemia Screening:not fasting, wants to do labs  -Hx of HTN: no  -Vaccines: agreed to flu vaccine today  -sexual activity: yes, male partner, no new partners  -wants STI testing, Hep C screening (if born 49-1966):  He denies concerns for STI but requests HIV, RPR and GC chlamydia testing with labs today   -Alcohol, Tobacco, drug use: see social history  Review of Systems - no fevers, unintentional weight loss, vision loss, hearing loss, chest pain, sob, hemoptysis, melena, hematochezia, hematuria, genital discharge, changing or concerning skin lesions, bleeding, bruising, loc, thoughts of self harm or SI  Past Medical History  Diagnosis Date  . 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 Left ankle sprain     No past surgical history on file.  Family History  Problem Relation Age of Onset  . Alcohol abuse Father   . Heart disease Maternal Grandmother   . Cancer Paternal Grandmother     breast    Social History   Social History  . Marital Status: Single    Spouse Name: N/A  . Number of Children: N/A  . Years of Education: N/A   Social History Main Topics  . Smoking status: Never Smoker   . Smokeless tobacco: None  . Alcohol Use: Yes     Comment: under safe drinking levels  . Drug Use: Yes    Special: Marijuana  . Sexual Activity: Yes     Comment: male partner   Other Topics Concern  . 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  HYDROcodone-acetaminophen (NORCO/VICODIN) 5-325 MG tablet, TK 1-2 TS PO Q 6 H PRN, Disp: , Rfl: 0 .  IBUPROFEN PO, Take by mouth., Disp: , Rfl:   EXAM:  Filed Vitals:   11/23/15 1313  BP: 118/78  Pulse: 67  Temp: 98.8 F (37.1 C)  TempSrc: Oral  Height:  (1.803 m)  Weight: 181 lb 11.2 oz (82.419 kg)    Estimated body mass index is 25.35 kg/(m^2) as calculated from the following:   Height as of this encounter:  (1.803 m).   Weight as of this encounter: 181 lb 11.2 oz (82.419 kg).  GENERAL: vitals reviewed and listed below, alert, oriented, appears well hydrated and in no acute distress  HEENT: head atraumatic, PERRLA, normal appearance of eyes, ears, nose and mouth. moist mucus membranes.  NECK: supple, no masses or lymphadenopathy  LUNGS: clear to auscultation bilaterally, no rales, rhonchi or wheeze  CV: HRRR, no peripheral edema or cyanosis, normal pedal pulses  ABDOMEN: bowel sounds normal, soft, non tender to palpation, no masses, no rebound or guarding  GU: declined  SKIN: no rash or abnormal lesions - declined full skin exam  MS: normal gait, moves all extremities  normally  NEURO: CN II-XII grossly intact, normal muscle strength and sensation to light touch on extremities  PSYCH: normal affect, pleasant and cooperative  ASSESSMENT AND PLAN:  Discussed the following assessment and plan:  Visit for preventive health examination - Plan: Cholesterol, Total, HDL cholesterol, Hemoglobin A1c, HIV antibody (with reflex), RPR, GC/Chlamydia Amp Probe, Urine  -Discussed and advised all [place] preventive services health task force level A and B recommendations for age, sex and risks.  -Advised at least 150 minutes of exercise per week and a healthy diet low in saturated fats and sweets and consisting of fresh fruits and vegetables, lean  meats such as fish and white chicken and whole grains.  -NON-FASTING labs, studies and vaccines per orders this encounter   Patient advised to return to clinic immediately if symptoms worsen or persist or new concerns.  Patient Instructions   Before you leave:  - flu vaccine  -TB skin test  - labs   -schedule physical in one year   -We have ordered labs or studies at this visit. It can take up to 1-2 weeks for results and processing. We will contact you with instructions IF your results are abnormal. Normal results will be released to your [facility]. If you have not heard from [place] or can not find your results in [facility] in 2 weeks please contact our office.  We recommend the following healthy lifestyle measures: - eat a healthy whole foods diet consisting of regular small meals composed of vegetables, fruits, beans, nuts, seeds, healthy meats such as white chicken and fish and whole grains.  - avoid sweets, white starchy foods, fried foods, fast food, processed foods, sodas, red meet and other fattening foods.  - get a least 150-300 minutes of aerobic exercise per week.           No Follow-up on file.   Kriste Basque R.

## 2015-11-23 NOTE — Addendum Note (Signed)
Addended by: Johnella Moloney on: 11/23/2015 01:55 PM   Modules accepted: Orders

## 2015-11-23 NOTE — Progress Notes (Signed)
Pre visit review using our clinic review tool, if applicable. No additional management support is needed unless otherwise documented below in the visit note. 

## 2015-11-24 LAB — HIV ANTIBODY (ROUTINE TESTING W REFLEX): HIV 1&2 Ab, 4th Generation: NONREACTIVE

## 2015-11-24 LAB — RPR

## 2015-11-25 LAB — URINE CYTOLOGY ANCILLARY ONLY
CHLAMYDIA, DNA PROBE: NEGATIVE
NEISSERIA GONORRHEA: NEGATIVE

## 2015-11-25 LAB — TB SKIN TEST
INDURATION: 0 mm
TB SKIN TEST: NEGATIVE

## 2015-12-07 ENCOUNTER — Ambulatory Visit (INDEPENDENT_AMBULATORY_CARE_PROVIDER_SITE_OTHER): Payer: BC Managed Care – PPO | Admitting: Family Medicine

## 2015-12-07 ENCOUNTER — Encounter: Payer: Self-pay | Admitting: *Deleted

## 2015-12-07 ENCOUNTER — Encounter: Payer: Self-pay | Admitting: Family Medicine

## 2015-12-07 VITALS — BP 120/78 | HR 88 | Temp 99.0°F | Ht 71.0 in | Wt 175.9 lb

## 2015-12-07 DIAGNOSIS — R197 Diarrhea, unspecified: Secondary | ICD-10-CM | POA: Diagnosis not present

## 2015-12-07 DIAGNOSIS — R1110 Vomiting, unspecified: Secondary | ICD-10-CM

## 2015-12-07 LAB — POCT INFLUENZA A/B
Influenza A, POC: NEGATIVE
Influenza B, POC: NEGATIVE

## 2015-12-07 NOTE — Progress Notes (Signed)
Pre visit review using our clinic review tool, if applicable. No additional management support is needed unless otherwise documented below in the visit note. 

## 2015-12-07 NOTE — Addendum Note (Signed)
Addended by: Johnella MoloneyFUNDERBURK, Genelda Roark A on: 12/07/2015 10:51 AM   Modules accepted: Orders

## 2015-12-07 NOTE — Patient Instructions (Signed)
Please in sure you are drinking plenty of fluids with electrolytes-Gatorade or soup broth.  Please take Imodium as needed for the diarrhea.  Please avoid dairy products for 5-7 days.  Follow-up if your worsening, has new symptoms or symptoms persist longer than expected.

## 2015-12-07 NOTE — Progress Notes (Signed)
  HPI:  Fraser DinCarrington E Frampton is a pleasant 30 year old here for an acute visit for flu like symptoms. He reports he had sudden onset of headaches, malaise,  upper respiratory symptoms, nausea, vomiting and diarrhea that started about 2-3 days ago. He had 2 episodes of emesis and diarrhea yesterday. He is improving a little today. Denies focal abdominal pain, shortness of breath, wheezing, inability to tolerate oral intake of liquids.  ROS : See pertinent positives and negatives per HPI.  Past Medical History  Diagnosis Date  . 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 Left ankle sprain     No past surgical history on file.  Family History  Problem Relation Age of Onset  . Alcohol abuse Father   . Heart disease Maternal Grandmother   . Cancer Paternal Grandmother     breast    Social History   Social History  . Marital Status: Single    Spouse Name: N/A  . Number of Children: N/A  . Years of Education: N/A   Social History Main Topics  . Smoking status: Never Smoker   . Smokeless tobacco: None  . Alcohol Use: Yes     Comment: under safe drinking levels  . Drug Use: Yes    Special: Marijuana  . Sexual Activity: Yes     Comment: male partner   Other Topics Concern  . 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  EXAM:  Filed Vitals:   12/07/15 1018  BP: 120/78  Pulse: 88  Temp: 99 F (37.2 C)    Body mass index is 24.54 kg/(m^2).  GENERAL: vitals reviewed and listed above, alert, oriented, appears well hydrated and in no acute distress  HEENT: atraumatic, conjunttiva clear, no obvious abnormalities on inspection of external nose and ears, normal appearance of ear canals and TMs, clear nasal congestion, mild post oropharyngeal erythema with PND, no tonsillar edema or exudate, no  sinus TTP  NECK: no obvious masses on inspection  LUNGS: clear to auscultation bilaterally, no wheezes, rales or rhonchi, good air movement  CV: HRRR, no peripheral edema  ABD: BS+, soft, NTTP  MS: moves all extremities without noticeable abnormality  PSYCH: pleasant and cooperative, no obvious depression or anxiety  ASSESSMENT AND PLAN:  Discussed the following assessment and plan:  Vomiting and diarrhea  -Rapid flu  -likely viral gastroenteritis of mild influenza, out of window for likely benefit from tamiflu so opted against. -Supportive care and return precautions -discussed transmission, potential complications, importance of oral rehydration.   -Patient advised to return or notify a doctor immediately if symptoms worsen or persist or new concerns arise.  Patient Instructions  Please in sure you are drinking plenty of fluids with electrolytes-Gatorade or soup broth.  Please take Imodium as needed for the diarrhea.  Please avoid dairy products for 5-7 days.  Follow-up if your worsening, has new symptoms or symptoms persist longer than expected.       Kriste BasqueKIM, Jayden Rudge R.

## 2016-10-12 ENCOUNTER — Encounter: Payer: Self-pay | Admitting: Family Medicine

## 2016-10-12 ENCOUNTER — Ambulatory Visit (INDEPENDENT_AMBULATORY_CARE_PROVIDER_SITE_OTHER): Payer: BC Managed Care – PPO | Admitting: Family Medicine

## 2016-10-12 VITALS — BP 102/80 | HR 79 | Temp 98.8°F | Ht 71.0 in | Wt 183.3 lb

## 2016-10-12 DIAGNOSIS — R21 Rash and other nonspecific skin eruption: Secondary | ICD-10-CM | POA: Diagnosis not present

## 2016-10-12 DIAGNOSIS — M545 Low back pain: Secondary | ICD-10-CM | POA: Diagnosis not present

## 2016-10-12 DIAGNOSIS — G8929 Other chronic pain: Secondary | ICD-10-CM
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
## 2016-10-12 NOTE — Patient Instructions (Addendum)
BEFORE YOU LEAVE: -low back exercises -follow up: CPE/Follow up in 1-2 months  Avoid use of the Proactiv and Retinoids. Avoid sun exposure. Can use topical hydrocortisone and aquaphor on skin to help heal. Follow up as needed if rash persists or worsens.  For the back, do the exercises 4 days per week. Heat for 15 minutes several times daily. Topical sports creams with capsaicin or menthol can help if needed for pain. Follow up at physical in 1-2 months or sooner if needed.

## 2016-10-12 NOTE — Progress Notes (Signed)
Pre visit review using our clinic review tool, if applicable. No additional management support is needed unless otherwise documented below in the visit note. 

## 2016-11-08 ENCOUNTER — Ambulatory Visit (INDEPENDENT_AMBULATORY_CARE_PROVIDER_SITE_OTHER): Payer: BC Managed Care – PPO | Admitting: Internal Medicine

## 2016-11-08 ENCOUNTER — Encounter: Payer: Self-pay | Admitting: Family Medicine

## 2016-11-08 ENCOUNTER — Encounter: Payer: Self-pay | Admitting: Internal Medicine

## 2016-11-08 VITALS — BP 130/70 | Temp 98.7°F | Wt 185.0 lb

## 2016-11-08 DIAGNOSIS — R6889 Other general symptoms and signs: Secondary | ICD-10-CM | POA: Diagnosis not present

## 2016-11-08 DIAGNOSIS — R112 Nausea with vomiting, unspecified: Secondary | ICD-10-CM | POA: Diagnosis not present

## 2016-11-08 DIAGNOSIS — Z20828 Contact with and (suspected) exposure to other viral communicable diseases: Secondary | ICD-10-CM

## 2016-11-08 LAB — POCT INFLUENZA A/B
INFLUENZA B, POC: NEGATIVE
Influenza A, POC: NEGATIVE

## 2016-11-08 NOTE — Patient Instructions (Addendum)
Despite no documented fever with your exposure and symptoms this acts like influenza. Also there are other viruses that act  Like  flu but improved more quickly. At this time your exam is reassuring although you are sick. Rest make sure you're getting in fluids we can send in and antinausea medicine to keep fluids down. He wants to avoid dehydration do not use aspirin. If you are having shortness of breath severe pain no improvement in the next 48 hours although your cough can last for weeks contact us for further evaluation. I believe that he would not benefit from Tamiflu is in active treatment and because of the timing of your symptoms.      Influenza, Adult Influenza, more commonly known as "the flu," is a viral infection that primarily affects the respiratory tract. The respiratory tract includes organs that help you breathe, such as the lungs, nose, and throat. The flu causes many common cold symptoms, as well as a high fever and body aches. The flu spreads easily from person to person (is contagious). Getting a flu shot (influenza vaccination) every year is the best way to prevent influenza. What are the causes? Influenza is caused by a virus. You can catch the virus by:  Breathing in droplets from an infected person's cough or sneeze.  Touching something that was recently contaminated with the virus and then touching your mouth, nose, or eyes. What increases the risk? The following factors may make you more likely to get the flu:  Not cleaning your hands frequently with soap and water or alcohol-based hand sanitizer.  Having close contact with many people during cold and flu season.  Touching your mouth, eyes, or nose without washing or sanitizing your hands first.  Not drinking enough fluids or not eating a healthy diet.  Not getting enough sleep or exercise.  Being under a high amount of stress.  Not getting a yearly (annual) flu shot. You may be at a higher risk of  complications from the flu, such as a severe lung infection (pneumonia), if you:  Are over the age of 29.  Are pregnant.  Have a weakened disease-fighting system (immune system). You may have a weakened immune system if you:  Have HIV or AIDS.  Are undergoing chemotherapy.  Aretaking medicines that reduce the activity of (suppress) the immune system.  Have a long-term (chronic) illness, such as heart disease, kidney disease, diabetes, or lung disease.  Have a liver disorder.  Are obese.  Have anemia. What are the signs or symptoms? Symptoms of this condition typically last 4-10 days and may include:  Fever.  Chills.  Headache, body aches, or muscle aches.  Sore throat.  Cough.  Runny or congested nose.  Chest discomfort and cough.  Poor appetite.  Weakness or tiredness (fatigue).  Dizziness.  Nausea or vomiting. How is this diagnosed? This condition may be diagnosed based on your medical history and a physical exam. Your health care provider may do a nose or throat swab test to confirm the diagnosis. How is this treated? If influenza is detected early, you can be treated with antiviral medicine that can reduce the length of your illness and the severity of your symptoms. This medicine may be given by mouth (orally) or through an IV tube that is inserted in one of your veins. The goal of treatment is to relieve symptoms by taking care of yourself at home. This may include taking over-the-counter medicines, drinking plenty of fluids, and adding humidity to the  air in your home. In some cases, influenza goes away on its own. Severe influenza or complications from influenza may be treated in a hospital. Follow these instructions at home:  Take over-the-counter and prescription medicines only as told by your health care provider.  Use a cool mist humidifier to add humidity to the air in your home. This can make breathing easier.  Rest as needed.  Drink enough  fluid to keep your urine clear or pale yellow.  Cover your mouth and nose when you cough or sneeze.  Wash your hands with soap and water often, especially after you cough or sneeze. If soap and water are not available, use hand sanitizer.  Stay home from work or school as told by your health care provider. Unless you are visiting your health care provider, try to avoid leaving home until your fever has been gone for 24 hours without the use of medicine.  Keep all follow-up visits as told by your health care provider. This is important. How is this prevented?  Getting an annual flu shot is the best way to avoid getting the flu. You may get the flu shot in late summer, fall, or winter. Ask your health care provider when you should get your flu shot.  Wash your hands often or use hand sanitizer often.  Avoid contact with people who are sick during cold and flu season.  Eat a healthy diet, drink plenty of fluids, get enough sleep, and exercise regularly. Contact a health care provider if:  You develop new symptoms.  You have:  Chest pain.  Diarrhea.  A fever.  Your cough gets worse.  You produce more mucus.  You feel nauseous or you vomit. Get help right away if:  You develop shortness of breath or difficulty breathing.  Your skin or nails turn a bluish color.  You have severe pain or stiffness in your neck.  You develop a sudden headache or sudden pain in your face or ear.  You cannot stop vomiting. This information is not intended to replace advice given to you by your health care provider. Make sure you discuss any questions you have with your health care provider. Document Released: 09/15/2000 Document Revised: 02/24/2016 Document Reviewed: 07/13/2015 Elsevier Interactive Patient Education  2017 [employer]

## 2016-11-08 NOTE — Progress Notes (Signed)
Chief Complaint  Patient presents with  . Flu Like Symptoms    Cough, Dizziness, Nasal Congestion, Fatgue and Vomiting  . Exposure to Influenza    HPI: Marcus Bullock 31 y.o.  sda   See above   No  Fever 2 days   Of sx and progressing  Headache  Like sinus and then cough and then  Sneezing and runny nose   And today   No energy   No immunization.    Dizzy feeling  And extreme fatigue  No rash  isa a Engineer, maintenance (IT)k teacher  [employer]  Exposed to fam member alst week inf   But stayed away . Vomited x 1 no excess pain  No diarrhea rash    ROS: See pertinent positives and negatives per HPI. No fever but some achiness chills ?   No new meds   Past Medical History:  Diagnosis Date  . Left ankle sprain   . 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 Alcohol abuse Father   . Heart disease Maternal Grandmother   . Cancer Paternal Grandmother     breast    Social History   Social History  . Marital status: Single    Spouse name: N/A  . Number of children: N/A  . Years of education: N/A   Social History Main Topics  . Smoking status: Never Smoker  . Smokeless tobacco: Never Used  . Alcohol use Yes     Comment: under safe drinking levels  . Drug use: Yes    Types: Marijuana  . Sexual activity: Yes     Comment: male partner   Other Topics Concern  . 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   No facility-administered medications prior to visit.      EXAM:  BP 130/70 (BP Location: Right Arm, Patient Position: Sitting, Cuff Size: Normal)   Temp 98.7 F (37.1 C) (Oral)   Wt 185 lb (83.9 kg)   SpO2 98%   BMI 25.80 kg/m   Body mass index is 25.8 kg/m. WDWN in NAD  quiet respirations; mildly congested  somewhat hoarse. Non toxic . But tired    HEENT: Normocephalic ;atraumatic , Eyes;  PERRL, EOMs  Full, lids and conjunctiva clear,,Ears: no deformities, canals nl, TM landmarks normal, Nose: no deformity or discharge but congested;face non  tender Mouth : OP clear without lesion or edema . Neck: Supple without adenopathy or masses or bruits Chest:  Clear to A&P without wheezes rales or rhonchi CV:  S1-S2 no gallops or murmurs peripheral perfusion is normal  Pulse 95  Pulse ox 98  Hr rpiomenet left and right parasternal but  Nl pmi  Abdomen:  Sof,t normal bowel sounds witouut hepatosplenomegaly, no guarding rebound or masses no CVA tenderness  Skin :nl perfusion and no acute rashes  No non  cteric  poct neg flu  ASSESSMENT AND PLAN:  Discussed the following assessment and plan:  Flu-like symptoms - Plan: POC Influenza A/B  Exposure to the flu - Plan: POC Influenza A/B  Non-intractable vomiting with nausea, unspecified vomiting type - 1 x  This acts flulike with reassuring exam. Do not think he would  benefit from Tamiflu) rest fluids declined medicine for nausea and vomiting no work tomorrow can return on Friday if improving and no fever. Discussed alarm findings he should check back with [place]. -Patient advised to return or notify health care team  if symptoms worsen ,persist or new concerns arise.  Patient Instructions  Despite no documented fever with your exposure and symptoms this acts like influenza. Also there are other viruses that act  Like  flu but improved more quickly. At this time your exam is reassuring although you are sick. Rest make sure you're getting in fluids we can send in and antinausea medicine to keep fluids down. He wants to avoid dehydration do not use aspirin. If you are having shortness of breath severe pain no improvement in the next 48 hours although your cough can last for weeks contact us for further evaluation. I believe that he would not benefit from Tamiflu is in active treatment and because of the  timing of your symptoms.      Influenza, Adult Influenza, more commonly known as "the flu," is a viral infection that primarily affects the respiratory tract. The respiratory tract includes organs that help you breathe, such as the lungs, nose, and throat. The flu causes many common cold symptoms, as well as a high fever and body aches. The flu spreads easily from person to person (is contagious). Getting a flu shot (influenza vaccination) every year is the best way to prevent influenza. What are the causes? Influenza is caused by a virus. You can catch the virus by:  Breathing in droplets from an infected person's cough or sneeze.  Touching something that was recently contaminated with the virus and then touching your mouth, nose, or eyes. What increases the risk? The following factors may make you more likely to get the flu:  Not cleaning your hands frequently with soap and water or alcohol-based hand sanitizer.  Having close contact with many people during cold and flu season.  Touching your mouth, eyes, or nose without washing or sanitizing your hands first.  Not drinking enough fluids or not eating a healthy diet.  Not getting enough sleep or exercise.  Being under a high amount of stress.  Not getting a yearly (annual) flu shot. You may be at a higher risk of complications from the flu, such as a severe lung infection (pneumonia), if you:  Are over the age of 92.  Are pregnant.  Have a weakened disease-fighting system (immune system). You may have a weakened immune system if you:  Have HIV or AIDS.  Are undergoing chemotherapy.  Aretaking medicines that reduce the activity of (suppress) the immune system.  Have a long-term (chronic) illness, such as heart disease, kidney disease, diabetes, or lung disease.  Have a liver disorder.  Are obese.  Have anemia. What are the signs or symptoms? Symptoms of this condition typically last 4-10 days and may  include:  Fever.  Chills.  Headache, body aches, or muscle aches.  Sore throat.  Cough.  Runny or congested nose.  Chest discomfort and cough.  Poor appetite.  Weakness or tiredness (fatigue).  Dizziness.  Nausea or vomiting. How is this diagnosed? This condition may be diagnosed based on your medical history and a physical exam. Your health care provider may do a nose or throat swab test to confirm the diagnosis. How is this treated? If influenza is detected early, you can be treated with antiviral medicine that can reduce the length of your illness and the  severity of your symptoms. This medicine may be given by mouth (orally) or through an IV tube that is inserted in one of your veins. The goal of treatment is to relieve symptoms by taking care of yourself at home. This may include taking over-the-counter medicines, drinking plenty of fluids, and adding humidity to the air in your home. In some cases, influenza goes away on its own. Severe influenza or complications from influenza may be treated in a hospital. Follow these instructions at home:  Take over-the-counter and prescription medicines only as told by your health care provider.  Use a cool mist humidifier to add humidity to the air in your home. This can make breathing easier.  Rest as needed.  Drink enough fluid to keep your urine clear or pale yellow.  Cover your mouth and nose when you cough or sneeze.  Wash your hands with soap and water often, especially after you cough or sneeze. If soap and water are not available, use hand sanitizer.  Stay home from work or school as told by your health care provider. Unless you are visiting your health care provider, try to avoid leaving home until your fever has been gone for 24 hours without the use of medicine.  Keep all follow-up visits as told by your health care provider. This is important. How is this prevented?  Getting an annual flu shot is the best way to  avoid getting the flu. You may get the flu shot in late summer, fall, or winter. Ask your health care provider when you should get your flu shot.  Wash your hands often or use hand sanitizer often.  Avoid contact with people who are sick during cold and flu season.  Eat a healthy diet, drink plenty of fluids, get enough sleep, and exercise regularly. Contact a health care provider if:  You develop new symptoms.  You have:  Chest pain.  Diarrhea.  A fever.  Your cough gets worse.  You produce more mucus.  You feel nauseous or you vomit. Get help right away if:  You develop shortness of breath or difficulty breathing.  Your skin or nails turn a bluish color.  You have severe pain or stiffness in your neck.  You develop a sudden headache or sudden pain in your face or ear.  You cannot stop vomiting. This information is not intended to replace advice given to you by your health care provider. Make sure you discuss any questions you have with your health care provider. Document Released: 09/15/2000 Document Revised: 02/24/2016 Document Reviewed: 07/13/2015 Elsevier Interactive Patient Education  2017 [employer].     [place]. Armie Moren M.D.

## 2016-12-14 ENCOUNTER — Encounter: Payer: BC Managed Care – PPO | Admitting: Family Medicine

## 2017-01-02 ENCOUNTER — Ambulatory Visit (INDEPENDENT_AMBULATORY_CARE_PROVIDER_SITE_OTHER): Payer: BC Managed Care – PPO | Admitting: Family Medicine

## 2017-01-02 ENCOUNTER — Encounter: Payer: Self-pay | Admitting: Family Medicine

## 2017-01-02 VITALS — BP 118/70 | HR 80 | Temp 98.0°F | Ht 71.0 in | Wt 188.2 lb

## 2017-01-02 DIAGNOSIS — G8929 Other chronic pain: Secondary | ICD-10-CM | POA: Diagnosis not present

## 2017-01-02 DIAGNOSIS — M545 Low back pain, unspecified: Secondary | ICD-10-CM

## 2017-01-02 MED ORDER — CYCLOBENZAPRINE HCL 5 MG PO TABS
5.0000 mg | ORAL_TABLET | Freq: Every evening | ORAL | 0 refills | Status: DC | PRN
Start: 2017-01-02 — End: 2017-12-31
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
## 2017-01-02 NOTE — Patient Instructions (Addendum)
BEFORE YOU LEAVE: -follow up: keep physical as scheduled in a few weeks -xray sheet  Get the xray  Muscle relaxer at night.  Heat for 15- minutes twice daily.  Topical sports creams available over the counter (tiger balm) can help.  Aleve 1-2 times daily if needed for pain.  Cut down on wt or back workouts at the gym, but continue home exercises we gave you  3-4 days per week.  I hope you are feeling better soon! Seek care immediately if worsening, new concerns or you are not improving with treatment.

## 2017-01-02 NOTE — Progress Notes (Signed)
Pre visit review using our clinic review tool, if applicable. No additional management support is needed unless otherwise documented below in the visit note. 

## 2017-01-03 ENCOUNTER — Ambulatory Visit (INDEPENDENT_AMBULATORY_CARE_PROVIDER_SITE_OTHER)
Admission: RE | Admit: 2017-01-03 | Discharge: 2017-01-03 | Disposition: A | Discharge status: Home / Self Care | Payer: BC Managed Care – PPO | Source: Ambulatory Visit | Attending: Family Medicine | Admitting: Family Medicine

## 2017-01-03 DIAGNOSIS — M545 Low back pain, unspecified: Secondary | ICD-10-CM

## 2017-01-03 DIAGNOSIS — G8929 Other chronic pain: Secondary | ICD-10-CM | POA: Diagnosis not present

## 2017-01-18 ENCOUNTER — Other Ambulatory Visit (HOSPITAL_COMMUNITY)
Admission: RE | Admit: 2017-01-18 | Discharge: 2017-01-18 | Disposition: A | Discharge status: Home / Self Care | Payer: BC Managed Care – PPO | Source: Ambulatory Visit | Attending: Family Medicine | Admitting: Family Medicine

## 2017-01-18 ENCOUNTER — Ambulatory Visit (INDEPENDENT_AMBULATORY_CARE_PROVIDER_SITE_OTHER): Payer: BC Managed Care – PPO | Admitting: Family Medicine

## 2017-01-18 ENCOUNTER — Encounter: Payer: Self-pay | Admitting: Family Medicine

## 2017-01-18 VITALS — BP 112/72 | HR 81 | Temp 98.3°F | Ht 70.5 in | Wt 185.0 lb

## 2017-01-18 DIAGNOSIS — M545 Low back pain, unspecified: Secondary | ICD-10-CM | POA: Insufficient documentation

## 2017-01-18 DIAGNOSIS — J302 Other seasonal allergic rhinitis: Secondary | ICD-10-CM | POA: Diagnosis not present

## 2017-01-18 DIAGNOSIS — Z113 Encounter for screening for infections with a predominantly sexual mode of transmission: Secondary | ICD-10-CM

## 2017-01-18 DIAGNOSIS — R234 Changes in skin texture: Secondary | ICD-10-CM | POA: Diagnosis not present

## 2017-01-18 DIAGNOSIS — Z0000 Encounter for general adult medical examination without abnormal findings: Secondary | ICD-10-CM | POA: Insufficient documentation

## 2017-01-18 DIAGNOSIS — F439 Reaction to severe stress, unspecified: Secondary | ICD-10-CM

## 2017-01-18 NOTE — Progress Notes (Signed)
Pre visit review using our clinic review tool, if applicable. No additional management support is needed unless otherwise documented below in the visit note. 

## 2017-01-18 NOTE — Progress Notes (Signed)
HPI:  Here for CPE:  -Concerns and/or follow up today:  Seen for back pain earlier this month. Reports that improved, now some low back pain on the other side. No radiation, weakness, numbness, fevers, malaise. Not doing the HEP or taking the muscle relaxer. Dealing with allergies - nasal congestion, PND, sneezing. Zyrtec has helped in the past. No fevers, sinus pain, asthma symptoms, SOB. Mildly + depression screen. Reports is mild and chrornic. Feels is normal reaction to stress. Can have a day or two of feeling mildly depressed and stressed when work is tough, then improves with thinking things through. Denies SI, thought of harm to self or others, hallucination, manic symptoms. Bumps on skin L breast. Chronic, reports there for many years but he feels they have enlarged.  -Diet: variety of foods, balance and well rounded  -Exercise: regular exercise  -Diabetes and Dyslipidemia Screening: has had screening  -Hx of HTN: no  -Vaccines: UTD  -sexual activity: yes, one male partner, new partner so request testing for STIs. Want to check urine for GC/Chlam/trich and do blood work for HIV/RPR  -FH colon or prstate ca: see FH Last colon cancer screening: n/a Last prostate ca screening: n/a  -Alcohol, Tobacco, drug use: see social history  Review of Systems - no fevers, unintentional weight loss, vision loss, hearing loss, chest pain, sob, hemoptysis, melena, hematochezia, hematuria, genital discharge, changing or concerning skin lesions, bleeding, bruising, loc, thoughts of self harm or SI  Past Medical History:  Diagnosis Date  . Left ankle sprain   . 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  Family History  Problem Relation Age of Onset  . Alcohol abuse Father   . Heart disease Maternal Grandmother   . Cancer Paternal Grandmother     breast    Social History   Social History  . Marital status:  Single    Spouse name: N/A  . Number of children: N/A  . Years of education: N/A   Social History Main Topics  . Smoking status: Never Smoker  . Smokeless tobacco: Never Used  . Alcohol use Yes     Comment: under safe drinking levels  . Drug use: Yes    Types: Marijuana  . Sexual activity: Yes     Comment: male partner   Other Topics Concern  . 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  cyclobenzaprine (FLEXERIL) 5 MG tablet, Take 1 tablet (5 mg total) by mouth at bedtime as needed for muscle spasms., Disp: 20 tablet, Rfl: 0  EXAM:  Vitals:   01/18/17 1557  BP: 112/72  Pulse: 81  Temp: 98.3 F (36.8 C)  TempSrc: Oral  Weight: 185 lb (83.9 kg)  Height: 5' 10.5" (1.791 m)    Estimated body mass index is 26.17 kg/m as calculated from the following:   Height as of this encounter: 5' 10.5" (1.791 m).   Weight as of this encounter: 185 lb (83.9 kg).  GENERAL: vitals reviewed and listed below, alert, oriented, appears well hydrated and in no acute distress  HEENT: head atraumatic, PERRLA, normal appearance of eyes, ears, nose and mouth. moist mucus membranes.  NECK: supple, no masses or lymphadenopathy  LUNGS: clear  to auscultation bilaterally, no rales, rhonchi or wheeze  CV: HRRR, no peripheral edema or cyanosis, normal pedal pulses  ABDOMEN: bowel sounds normal, soft, non tender to palpation, no masses, no rebound or guarding  GU: declined  RECTAL: declined  SKIN: no rash or abnormal lesions except for several hyperpigmented papule with underlying subcut nodules L breast above areola  MS: normal gait, moves all extremities normally, TTP minimal L lumbar, no other abnormalities on exam of low back  NEURO: normal gait, speech and thought processing grossly intact, muscle tone grossly intact throughout  PSYCH: normal  affect, pleasant and cooperative  ASSESSMENT AND PLAN:  Discussed the following assessment and plan:  Encounter for preventative adult health care examination -Discussed and advised all [place] preventive services health task force level A and B recommendations for age, sex and risks. --Advised at least 150 minutes of exercise per week and a healthy diet with avoidance of (less then 1 serving per week) processed foods, white starches, red meat, fast foods and sweets and consisting of: * 5-9 servings of fresh fruits and vegetables (not corn or potatoes) *nuts and seeds, beans *olives and olive oil *lean meats such as fish and white chicken  *whole grains -labs, studies and vaccines per orders this encounter  Routine screening for STI (sexually transmitted infection) - Plan: HIV antibody, RPR, Urine cytology ancillary only, HIV antibody, RPR, Urine cytology ancillary only  Seasonal allergic rhinitis, unspecified trigger -trial getting back on zyrtec  Stress -? Mild depression, discussed tx options and he is agreeable to considering CBT - number provided to call  Acute low back pain, unspecified back pain laterality, with sciatica presence unspecified HEP, evening flexeril if needed, f/u in 2-3 weeks if persists  Breast skin changes -we discussed possible serious and likely etiologies, workup and treatment, treatment risks and return precautions Advised dermatology evaluation, he agrees to schedule this    Patient advised to return to clinic immediately if symptoms worsen or persist or new concerns.  Patient Instructions  BEFORE YOU LEAVE: -follow up: 1-2 months for back, stress, allergies -results last ppd skin test -labs  For the allergies: -start zyrtec once daily at night  Do the back exercises at least 4 days per week  Call to schedule CBT for the stress - number provided  We have ordered labs or studies at this visit. It can take up to 1-2 weeks for results and  processing. IF results require follow up or explanation, we will call you with instructions. Clinically stable results will be released to your [facility]. If you have not heard from [place] or cannot find your results in [facility] in 2 weeks please contact our office at [phone].  If you are not yet signed up for [facility], please consider signing up.  We recommend the following healthy lifestyle for LIFE: 1) Small portions.   Tip: eat off of a salad plate instead of a dinner plate  Tip: if you need more or a snack choose fruits, veggies and/or a handful of nuts or seeds.  2) Eat a healthy clean diet.  * Tip: Avoid (less then 1 serving per week): processed foods, sweets, sweetened drinks, white starches (rice, flour, bread, potatoes, pasta, etc), red meat, fast foods, butter  *Tip: CHOOSE instead   * 5-9 servings per day of fresh or frozen fruits and vegetables (but not corn, potatoes, bananas, canned or dried fruit)   *nuts and seeds, beans   *olives and olive oil   *small portions of lean  meats such as fish and white chicken    *small portions of whole grains  3)Get at least 150 minutes of sweaty aerobic exercise per week.  4)Reduce stress - consider counseling, meditation and relaxation to balance other aspects of your life.     No Follow-up on file.   Kriste Basque R., DO

## 2017-01-18 NOTE — Patient Instructions (Signed)
BEFORE YOU LEAVE: -follow up: 1-2 months for back, stress, allergies -results last ppd skin test -labs  For the allergies: -start zyrtec once daily at night  Do the back exercises at least 4 days per week  Call to schedule CBT for the stress - number provided  We have ordered labs or studies at this visit. It can take up to 1-2 weeks for results and processing. IF results require follow up or explanation, we will call you with instructions. Clinically stable results will be released to your [facility]. If you have not heard from [place] or cannot find your results in [facility] in 2 weeks please contact our office at [phone].  If you are not yet signed up for [facility], please consider signing up.  We recommend the following healthy lifestyle for LIFE: 1) Small portions.   Tip: eat off of a salad plate instead of a dinner plate  Tip: if you need more or a snack choose fruits, veggies and/or a handful of nuts or seeds.  2) Eat a healthy clean diet.  * Tip: Avoid (less then 1 serving per week): processed foods, sweets, sweetened drinks, white starches (rice, flour, bread, potatoes, pasta, etc), red meat, fast foods, butter  *Tip: CHOOSE instead   * 5-9 servings per day of fresh or frozen fruits and vegetables (but not corn, potatoes, bananas, canned or dried fruit)   *nuts and seeds, beans   *olives and olive oil   *small portions of lean meats such as fish and white chicken    *small portions of whole grains  3)Get at least 150 minutes of sweaty aerobic exercise per week.  4)Reduce stress - consider counseling, meditation and relaxation to balance other aspects of your life.

## 2017-01-19 LAB — HIV ANTIBODY (ROUTINE TESTING W REFLEX): HIV 1&2 Ab, 4th Generation: NONREACTIVE

## 2017-01-19 LAB — RPR

## 2017-01-22 LAB — URINE CYTOLOGY ANCILLARY ONLY
CHLAMYDIA, DNA PROBE: NEGATIVE
NEISSERIA GONORRHEA: NEGATIVE
TRICH (WINDOWPATH): NEGATIVE

## 2017-03-20 ENCOUNTER — Telehealth: Payer: Self-pay | Admitting: Family Medicine

## 2017-03-20 DIAGNOSIS — Z0289 Encounter for other administrative examinations: Secondary | ICD-10-CM

## 2017-03-20 NOTE — Telephone Encounter (Signed)
Patient dropped off forms for work-- [employer]/TB testing and screening forms.  Call for pick up: [phone] Forms placed in Dr's folder

## 2017-03-26 NOTE — Telephone Encounter (Signed)
Pt has not had TB screen.  Unable to complete paper work without screen.  Please place her on the nurse schedule when she is available.  Thanks!!

## 2017-03-27 ENCOUNTER — Ambulatory Visit (INDEPENDENT_AMBULATORY_CARE_PROVIDER_SITE_OTHER): Payer: BC Managed Care – PPO

## 2017-03-27 DIAGNOSIS — Z111 Encounter for screening for respiratory tuberculosis: Secondary | ICD-10-CM

## 2017-03-27 NOTE — Telephone Encounter (Signed)
Pt scheduled  

## 2017-03-27 NOTE — Progress Notes (Signed)
Patient had a TB screening today 03/27/2017 at 2pm. Test was done on his left lower arm. Done by Mendel CorningNancy Kigotho CMA.

## 2017-03-29 LAB — TB SKIN TEST
INDURATION: 0 mm
TB Skin Test: NEGATIVE

## 2017-03-30 ENCOUNTER — Telehealth: Payer: Self-pay | Admitting: Family Medicine

## 2017-03-30 NOTE — Telephone Encounter (Signed)
Pt had PPD read on 03/29/17.  Form filled out and placed in Dr. Elmyra RicksKim's folder.

## 2017-03-30 NOTE — Telephone Encounter (Signed)
Pt following up on cpe paperwork. Pt says ne needs asap!

## 2017-04-02 NOTE — Telephone Encounter (Signed)
Form picked up and patient wants to be billed for the form fee

## 2017-04-02 NOTE — Telephone Encounter (Signed)
Pt notified to pick up at the front desk. 

## 2017-04-09 ENCOUNTER — Encounter: Payer: Self-pay | Admitting: Family Medicine

## 2017-04-09 ENCOUNTER — Ambulatory Visit (INDEPENDENT_AMBULATORY_CARE_PROVIDER_SITE_OTHER): Payer: BC Managed Care – PPO | Admitting: Family Medicine

## 2017-04-09 VITALS — BP 130/96 | HR 86 | Temp 97.7°F | Wt 186.4 lb

## 2017-04-09 DIAGNOSIS — G8929 Other chronic pain: Secondary | ICD-10-CM

## 2017-04-09 DIAGNOSIS — M545 Low back pain, unspecified: Secondary | ICD-10-CM

## 2017-04-09 LAB — POC URINALSYSI DIPSTICK (AUTOMATED)
BILIRUBIN UA: NEGATIVE
Blood, UA: NEGATIVE
Glucose, UA: NEGATIVE
KETONES UA: NEGATIVE
LEUKOCYTES UA: NEGATIVE
Nitrite, UA: NEGATIVE
PROTEIN UA: NEGATIVE
Spec Grav, UA: 1.015 (ref 1.010–1.025)
Urobilinogen, UA: 0.2 E.U./dL
pH, UA: 7 (ref 5.0–8.0)

## 2017-04-09 MED ORDER — DICLOFENAC SODIUM 75 MG PO TBEC: 75.0000 mg | DELAYED_RELEASE_TABLET | Freq: Two times a day (BID) | ORAL | 0 refills | Status: DC

## 2017-04-09 NOTE — Patient Instructions (Signed)
Please take medication as directed with food and follow up with Dr. Selena Batten if symptoms do not improve with treatment, worsen, or you develop new symptoms such as worsening pain or fever.  You can continue using muscle relaxer that was prescribed to you previously.  You will be contact about your referral.  Please let us know if you have not heard back within 1 week about your referral.   Back Exercises The following exercises strengthen the muscles that help to support the back. They also help to keep the lower back flexible. Doing these exercises can help to prevent back pain or lessen existing pain. If you have back pain or discomfort, try doing these exercises 2-3 times each day or as told by your health care provider. When the pain goes away, do them once each day, but increase the number of times that you repeat the steps for each exercise (do more repetitions). If you do not have back pain or discomfort, do these exercises once each day or as told by your health care provider. Exercises Single Knee to Chest  Repeat these steps 3-5 times for each leg: 1. Lie on your back on a firm bed or the floor with your legs extended. 2. Bring one knee to your chest. Your other leg should stay extended and in contact with the floor. 3. Hold your knee in place by grabbing your knee or thigh. 4. Pull on your knee until you feel a gentle stretch in your lower back. 5. Hold the stretch for 10-30 seconds. 6. Slowly release and straighten your leg.  Pelvic Tilt  Repeat these steps 5-10 times: 1. Lie on your back on a firm bed or the floor with your legs extended. 2. Bend your knees so they are pointing toward the ceiling and your feet are flat on the floor. 3. Tighten your lower abdominal muscles to press your lower back against the floor. This motion will tilt your pelvis so your tailbone points up toward the ceiling instead of pointing to your feet or the floor. 4. With gentle tension and even breathing,  hold this position for 5-10 seconds.  Cat-Cow  Repeat these steps until your lower back becomes more flexible: 1. Get into a hands-and-knees position on a firm surface. Keep your hands under your shoulders, and keep your knees under your hips. You may place padding under your knees for comfort. 2. Let your head hang down, and point your tailbone toward the floor so your lower back becomes rounded like the back of a cat. 3. Hold this position for 5 seconds. 4. Slowly lift your head and point your tailbone up toward the ceiling so your back forms a sagging arch like the back of a cow. 5. Hold this position for 5 seconds.  Press-Ups  Repeat these steps 5-10 times: 1. Lie on your abdomen (face-down) on the floor. 2. Place your palms near your head, about shoulder-width apart. 3. While you keep your back as relaxed as possible and keep your hips on the floor, slowly straighten your arms to raise the top half of your body and lift your shoulders. Do not use your back muscles to raise your upper torso. You may adjust the placement of your hands to make yourself more comfortable. 4. Hold this position for 5 seconds while you keep your back relaxed. 5. Slowly return to lying flat on the floor.  Bridges  Repeat these steps 10 times: 1. Lie on your back on a firm surface. 2.  Bend your knees so they are pointing toward the ceiling and your feet are flat on the floor. 3. Tighten your buttocks muscles and lift your buttocks off of the floor until your waist is at almost the same height as your knees. You should feel the muscles working in your buttocks and the back of your thighs. If you do not feel these muscles, slide your feet 1-2 inches farther away from your buttocks. 4. Hold this position for 3-5 seconds. 5. Slowly lower your hips to the starting position, and allow your buttocks muscles to relax completely.  If this exercise is too easy, try doing it with your arms crossed over your  chest. Abdominal Crunches  Repeat these steps 5-10 times: 1. Lie on your back on a firm bed or the floor with your legs extended. 2. Bend your knees so they are pointing toward the ceiling and your feet are flat on the floor. 3. Cross your arms over your chest. 4. Tip your chin slightly toward your chest without bending your neck. 5. Tighten your abdominal muscles and slowly raise your trunk (torso) high enough to lift your shoulder blades a tiny bit off of the floor. Avoid raising your torso higher than that, because it can put too much stress on your low back and it does not help to strengthen your abdominal muscles. 6. Slowly return to your starting position.  Back Lifts Repeat these steps 5-10 times: 1. Lie on your abdomen (face-down) with your arms at your sides, and rest your forehead on the floor. 2. Tighten the muscles in your legs and your buttocks. 3. Slowly lift your chest off of the floor while you keep your hips pressed to the floor. Keep the back of your head in line with the curve in your back. Your eyes should be looking at the floor. 4. Hold this position for 3-5 seconds. 5. Slowly return to your starting position.  Contact a health care provider if:  Your back pain or discomfort gets much worse when you do an exercise.  Your back pain or discomfort does not lessen within 2 hours after you exercise. If you have any of these problems, stop doing these exercises right away. Do not do them again unless your health care provider says that you can. Get help right away if:  You develop sudden, severe back pain. If this happens, stop doing the exercises right away. Do not do them again unless your health care provider says that you can. This information is not intended to replace advice given to you by your health care provider. Make sure you discuss any questions you have with your health care provider. Document Released: 10/26/2004 Document Revised: 01/26/2016 Document  Reviewed: 11/12/2014 [employer] Interactive Patient Education  2017 [employer]

## 2017-04-09 NOTE — Progress Notes (Signed)
Subjective:    Patient ID: Marcus Bullock, male    DOB: 02/03/1986, 31 y.o.   MRN: [id number]  HPI  [name] is a 31 year old male who presents today with back pain. BACK PAIN  Location: low back pain  Quality: 6 at highest  Onset: for "several months" possibly 4 to 5 months Worse with: standing     Better with:  Muscle relaxer provides moderate benefit    Radiation: No Trauma: No Best sitting/standing/leaning forward: Leaning forward and stretching   Red Flags Fecal/urinary incontinence: No  Numbness/Weakness: No Fever/chills/sweats: No Night pain:  No Unexplained weight loss: No No relief with bedrest:  No h/o cancer/immunosuppression:  No IV drug use: No   PMH of osteoporosis or chronic steroid use: No Works out in gym hourly two to three times/week  Imaging of spine completed 01/03/17:  No acute or significant chronic bony abnormality of lumbar spine noted.  Review of Systems  Constitutional: Negative for chills, fatigue and fever.  Respiratory: Negative for cough, shortness of breath and wheezing.   Cardiovascular: Negative for chest pain and palpitations.  Gastrointestinal: Negative for abdominal pain, blood in stool and nausea.  Genitourinary: Negative for dysuria, frequency, hematuria and urgency.  Musculoskeletal: Positive for back pain.  Skin: Negative for rash.  Neurological: Negative for dizziness, light-headedness, numbness and headaches.   Past Medical History:  Diagnosis Date  . Left ankle sprain   . 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 Marital status: Single    Spouse name: Marcus Bullock  . Number of children: Marcus Bullock  . Years of education: Marcus Bullock   Occupational History  . Not on file.   Social History Main Topics  . Smoking status: Never Smoker  . Smokeless tobacco: Never Used  . Alcohol use Yes     Comment: under safe drinking levels  . Drug use: Yes    Types: Marijuana   . Sexual activity: Yes     Comment: male partner   Other Topics Concern  . 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  Family History  Problem Relation Age of Onset  . Alcohol abuse Father   . Heart disease Maternal Grandmother   . Cancer Paternal Grandmother        breast    Allergies  Allergen Reactions  . Bee Venom Swelling    Current Outpatient Prescriptions on File Prior to Visit  Medication Sig Dispense Refill  . cyclobenzaprine (FLEXERIL) 5 MG tablet Take 1 tablet (5 mg total) by mouth at bedtime as needed for muscle spasms. 20 tablet 0   No current facility-administered medications on file prior to visit.     BP (!) 130/96 (BP Location: Left Arm, Patient Position: Sitting, Cuff Size: Normal)   Pulse 86   Temp 97.7 F (36.5 C) (Oral)   Wt 186 lb 6.4 oz (84.6 kg)   SpO2 98%   BMI 26.37 kg/m        Objective:   Physical Exam  Constitutional: He is oriented to person, place, and time. He appears well-developed and well-nourished.  Eyes: Pupils are equal, round, and reactive to light. No scleral icterus.  Neck:  Neck supple.  Cardiovascular: Normal rate, regular rhythm and intact distal pulses.   Pulmonary/Chest: Effort normal and breath sounds normal. He has no wheezes. He has no rales.  Abdominal: Soft. Bowel sounds are normal. There is no tenderness. There is no rebound.  Musculoskeletal: He exhibits no edema.  Spine with normal alignment and no deformity. No tenderness to vertebral process with palpation with the exception of mild tenderness noted about L4 -L5.  Paraspinous muscles are tender and patient notes this area as more "tender" on right side but is also present on left side. ROM is full at lumbar sacral regions. Negative Straight Leg raise. No CVA tenderness present. Able to heel/toe walk without pain.    Lymphadenopathy:    He has no cervical adenopathy.  Neurological: He is alert and oriented to person, place, and time. Coordination normal.  Skin: Skin is warm and dry. No rash noted.  Psychiatric: He has a normal mood and affect. His behavior is normal. Judgment and thought content normal.       Assessment & Plan:  1. Chronic bilateral low back pain without sciatica Exam is reassuring; UA unremarkable; suspect this is likely muscular in nature; advised short term use of antiinflammatory and referral to PT. Further encouraged supportive measures previously suggested by PCP such as muscle relaxer, heat, and topical sports creams. Further advised patient to follow up if symptoms do not improve with treatment, worsen, or new symptoms develop such as worsening pain or fever. - POCT Urinalysis Dipstick (Automated) - diclofenac (VOLTAREN) 75 MG EC tablet; Take 1 tablet (75 mg total) by mouth 2 (two) times daily.  Dispense: 30 tablet; Refill: 0 - Ambulatory referral to Physical Therapy  Roddie Mc, FNP-C

## 2017-06-12 ENCOUNTER — Ambulatory Visit (INDEPENDENT_AMBULATORY_CARE_PROVIDER_SITE_OTHER): Payer: BC Managed Care – PPO | Admitting: Family Medicine

## 2017-06-12 ENCOUNTER — Encounter: Payer: Self-pay | Admitting: Family Medicine

## 2017-06-12 ENCOUNTER — Other Ambulatory Visit (HOSPITAL_COMMUNITY)
Admission: RE | Admit: 2017-06-12 | Discharge: 2017-06-12 | Disposition: A | Discharge status: Home / Self Care | Payer: BC Managed Care – PPO | Source: Ambulatory Visit | Attending: Family Medicine | Admitting: Family Medicine

## 2017-06-12 VITALS — BP 122/82 | HR 95 | Temp 98.7°F | Ht 70.5 in | Wt 188.7 lb

## 2017-06-12 DIAGNOSIS — R869 Unspecified abnormal finding in specimens from male genital organs: Secondary | ICD-10-CM

## 2017-06-12 DIAGNOSIS — M545 Low back pain, unspecified: Secondary | ICD-10-CM

## 2017-06-12 LAB — POC URINALSYSI DIPSTICK (AUTOMATED)
Bilirubin, UA: NEGATIVE
GLUCOSE UA: NEGATIVE
Ketones, UA: NEGATIVE
Leukocytes, UA: NEGATIVE
Nitrite, UA: NEGATIVE
Protein, UA: NEGATIVE
RBC UA: NEGATIVE
SPEC GRAV UA: 1.02 (ref 1.010–1.025)
UROBILINOGEN UA: 0.2 U/dL
pH, UA: 7 (ref 5.0–8.0)

## 2017-06-12 NOTE — Progress Notes (Signed)
HPI:  Acute visit for several concerns:  1) discolored semen: -He has not noted this -However, his girlfriend told him on several occasions that his semen was slightly discolored - not red or bloody -He denies any pain with intercourse, dysuria, penile discharge, fevers, malaise, testicular pain -he wants to see a urologist  2) bilateral lumbar back pain: -Reports this is ongoing for 3-6 months -Reports he has quit exercising due to the pain and it has worsened -Reports he saw one of my colleagues a few months ago and they recommended physical therapy, he starts the physical therapy next week -we did an x-ray of his back in April and this looked okay -He wants to see a back specialist -Is also worried about his kidneys -Denies radiation, weakness, numbness, bowel or bladder dysfunction, weight loss  ROS: See pertinent positives and negatives per HPI.  Past Medical History:  Diagnosis Date  . Left ankle sprain   . 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  Family History  Problem Relation Age of Onset  . Alcohol abuse Father   . Heart disease Maternal Grandmother   . Cancer Paternal Grandmother        breast    Social History   Social History  . Marital status: Single    Spouse name: N/A  . Number of children: N/A  . Years of education: N/A   Social History Main Topics  . Smoking status: Never Smoker  . Smokeless tobacco: Never Used  . Alcohol use Yes     Comment: under safe drinking levels  . Drug use: Yes    Types: Marijuana  . Sexual activity: Yes     Comment: male partner   Other Topics Concern  . 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  cyclobenzaprine (FLEXERIL) 5 MG tablet, Take 1 tablet (5 mg total) by mouth at bedtime as needed for muscle  spasms., Disp: 20 tablet, Rfl: 0 .  diclofenac (VOLTAREN) 75 MG EC tablet, Take 1 tablet (75 mg total) by mouth 2 (two) times daily., Disp: 30 tablet, Rfl: 0  EXAM:  Vitals:   06/12/17 1527  BP: 122/82  Pulse: 95  Temp: 98.7 F (37.1 C)    Body mass index is 26.69 kg/m.  GENERAL: vitals reviewed and listed above, alert, oriented, appears well hydrated and in no acute distress  HEENT: atraumatic, conjunttiva clear, no obvious abnormalities on inspection of external nose and ears  NECK: no obvious masses on inspection  LUNGS: clear to auscultation bilaterally, no wheezes, rales or rhonchi, good air movement  CV: HRRR, no peripheral edema  MS: moves all extremities without noticeable abnormality Normal Gait Normal inspection of back, no obvious scoliosis or leg length descrepancy No bony TTP Soft tissue TTP at: bilat lumbar paraspinal muscles -/+ tests: neg trendelenburg,-facet loading, -SLRT, -CLRT, -FABER, -FADIR Normal muscle strength, sensation to light touch and DTRs in LEs bilaterally   PSYCH: pleasant and cooperative, no obvious depression or anxiety  ASSESSMENT AND PLAN:  Discussed the following assessment and plan:  Semen abnormal - Plan: Basic metabolic panel, CBC  Bilateral low back pain without sciatica, unspecified chronicity - Plan: Basic metabolic panel, CBC, Ambulatory referral to  Orthopedic Surgery  -we discussed possible serious and likely etiologies, workup and treatment, treatment risks and return precautions for his concerns -after this discussion, Marcus Bullock opted for labs and urine studies per orders and urology referral if unrevealing and symptoms persist; referral to ortho clinic for his ongoing back issues, advised he go ahead and start PT as planned -follow up advised 1-2 months -of course, we advised [name]  to return or notify a doctor immediately if symptoms worsen or persist or new concerns arise.   Patient Instructions  BEFORE YOU  LEAVE: -GC/Chlam urine -urine dip with reflex micro and culture if needed -follow up 1-2 months -lab  Start the physical therapy  -We placed a referral for you as discussed to the back specialist. It usually takes about 1-2 weeks to process and schedule this referral. If you have not heard from [place] regarding this appointment in 2 weeks please contact our office.  We have ordered labs or studies at this visit. It can take up to 1-2 weeks for results and processing. IF results require follow up or explanation, we will call you with instructions. Clinically stable results will be released to your [facility]. If you have not heard from [place] or cannot find your results in [facility] in 2 weeks please contact our office at [phone].  If you are not yet signed up for [facility], please consider signing up.          Kriste Basque R., DO

## 2017-06-12 NOTE — Addendum Note (Signed)
Addended by: Charna ElizabethLEMMONS, STEPHANIE L on: 06/12/2017 04:21 PM   Modules accepted: Orders

## 2017-06-12 NOTE — Addendum Note (Signed)
Addended by: Johnella MoloneyFUNDERBURK, JO A on: 06/12/2017 04:15 PM   Modules accepted: Orders

## 2017-06-12 NOTE — Addendum Note (Signed)
Addended by: Charna ElizabethLEMMONS, STEPHANIE L on: 06/12/2017 04:56 PM   Modules accepted: Orders

## 2017-06-12 NOTE — Patient Instructions (Signed)
BEFORE YOU LEAVE: -GC/Chlam urine -urine dip with reflex micro and culture if needed -follow up 1-2 months -lab  Start the physical therapy  -We placed a referral for you as discussed to the back specialist. It usually takes about 1-2 weeks to process and schedule this referral. If you have not heard from us regarding this appointment in 2 weeks please contact our office.  We have ordered labs or studies at this visit. It can take up to 1-2 weeks for results and processing. IF results require follow up or explanation, we will call you with instructions. Clinically stable results will be released to your [facility]. If you have not heard from us or cannot find your results in [facility] in 2 weeks please contact our office at [phone].  If you are not yet signed up for [facility], please consider signing up.

## 2017-06-13 LAB — BASIC METABOLIC PANEL
BUN: 11 mg/dL (ref 6–23)
CALCIUM: 9.5 mg/dL (ref 8.4–10.5)
CO2: 27 meq/L (ref 19–32)
CREATININE: 1.12 mg/dL (ref 0.40–1.50)
Chloride: 103 mEq/L (ref 96–112)
GFR: 97.71 mL/min (ref >60.00)
Glucose, Bld: 81 mg/dL (ref 70–99)
Potassium: 4.2 mEq/L (ref 3.5–5.1)
Sodium: 139 mEq/L (ref 135–145)

## 2017-06-13 LAB — CBC
HCT: 41.8 % (ref 39.0–52.0)
Hemoglobin: 13.5 g/dL (ref 13.0–17.0)
MCHC: 32.2 g/dL (ref 30.0–36.0)
MCV: 90.6 fl (ref 78.0–100.0)
Platelets: 216 10*3/uL (ref 150.0–400.0)
RBC: 4.61 Mil/uL (ref 4.22–5.81)
RDW: 11.9 % (ref 11.5–15.5)
WBC: 5.4 10*3/uL (ref 4.0–10.5)

## 2017-06-14 LAB — URINE CYTOLOGY ANCILLARY ONLY
Chlamydia: NEGATIVE
Neisseria Gonorrhea: NEGATIVE

## 2017-06-18 ENCOUNTER — Encounter: Payer: Self-pay | Admitting: Physical Therapy

## 2017-06-18 ENCOUNTER — Ambulatory Visit: Payer: BC Managed Care – PPO | Attending: Family Medicine | Admitting: Physical Therapy

## 2017-06-18 DIAGNOSIS — M545 Low back pain, unspecified: Secondary | ICD-10-CM

## 2017-06-18 DIAGNOSIS — M62830 Muscle spasm of back: Secondary | ICD-10-CM | POA: Insufficient documentation

## 2017-06-18 NOTE — Therapy (Signed)
[facility] [address] [place], [place], [address] Phone: [phone]   Fax:  [phone]  Physical Therapy Evaluation  Patient Details  Name: Marcus Bullock MRN: [id number] Date of Birth: Mar 26, 1986 Referring Provider: Kriste Basque  Encounter Date: 06/18/2017      PT End of Session - 06/18/17 1553    Visit Number 1   Date for PT Re-Evaluation 08/18/17   PT Start Time 1518   PT Stop Time 1618   PT Time Calculation (min) 60 min   Activity Tolerance Patient tolerated treatment well   Behavior During Therapy [facility] for tasks assessed/performed      Past Medical History:  Diagnosis Date  . Left ankle sprain   . 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 No pertinent surgical history.  There were no vitals filed for this visit.       Subjective Assessment - 06/18/17 1526    Subjective Patient reports that he has had low back pain for 3-4 months, he is unsure of a cause, x-rays are negative.  Denies radicular signs.  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 15 minutes   How long can you stand comfortably? 15 minutes   Patient Stated Goals have less pain, "get back to normal"   Currently in Pain? Yes   Pain Score 3    Pain Location Back   Pain Orientation Lower   Pain Descriptors / Indicators Aching;Tightness;Constant;Nagging   Pain Type Acute pain   Pain Radiating Towards denies N/T   Pain Onset More than a month ago   Pain Frequency Constant   Aggravating Factors  pain up to 7/10, reports if in one posistion too long it hurts   Pain Relieving Factors changing position pain at best 2/10   Effect of Pain on Daily Activities limits all activities "nagging pain"             [facility] PT Assessment - 06/18/17 0001      Assessment   Medical Diagnosis LBP   Referring Provider Kriste Basque   Onset Date/Surgical Date 05/18/17   Prior Therapy no     Precautions   Precautions None     Balance Screen   Has the patient fallen in the past 6 months No   Has the patient had a decrease in activity level because of a fear of falling?  No   Is the patient reluctant to leave their home because of a fear of falling?  No     Home Environment   Additional Comments has stairs, does housework     Prior Function   Level of Independence Independent   Vocation Full time employment   Buyer, retail at [employer] was going to the gym 2-3x/week, was playing basketball     Posture/Postural Control   Posture Comments fwd head, rounded shoulders     ROM / Strength   AROM / PROM / Strength AROM;Strength     AROM   Overall AROM Comments Lumbar ROM was decreased 25% for flexion, decreased 50% for extension     Strength   Overall Strength Comments WFL's  Flexibility   Soft Tissue Assessment /Muscle Length yes   Hamstrings very tight   Quadriceps mild tightness   Piriformis very tight     Palpation   Palpation comment tight and tender in the lumbar paraspinals            Objective measurements completed on examination: See above findings.          [facility] Adult PT Treatment/Exercise - 06/18/17 0001      Modalities   Modalities Electrical Stimulation;Moist Heat     Moist Heat Therapy   Number Minutes Moist Heat 15 Minutes   Moist Heat Location Lumbar Spine     Electrical Stimulation   Electrical Stimulation Location Lumbar   Electrical Stimulation Action IFC   Electrical Stimulation Parameters supine   Electrical Stimulation Goals Pain                PT Education - 06/18/17 1548    Education provided Yes   Education Details Wms Flexion, HS and piriformis stretches   Person(s) Educated Patient    Methods Explanation;Demonstration;Handout   Comprehension Verbalized understanding          PT Short Term Goals - 06/18/17 1556      PT SHORT TERM GOAL #1   Title independent with initial HEP   Time 2   Period Weeks   Status New           PT Long Term Goals - 06/18/17 1556      PT LONG TERM GOAL #1   Title understand proper posture and body mechanics   Time 8   Period Weeks   Status New     PT LONG TERM GOAL #2   Title decrease pain 50%   Time 8   Period Weeks   Status New     PT LONG TERM GOAL #3   Title increase lumbar ROM to WNL's   Time 8   Period Weeks   Status New     PT LONG TERM GOAL #4   Title return to gym without difficulty   Time 8   Period Weeks   Status New                Plan - 06/18/17 1553    Clinical Impression Statement Patient reports 2-4 months of low back pain, x-rays negaitve.  He reports that he has stopped a lot of exercises due to the pain, reports that sometimes when he is at rest the pain and tightness is the worst.  He had very tight HS and piriformis mms.  He has a weak core.   Clinical Presentation Stable   Clinical Decision Making Low   Rehab Potential Good   PT Frequency 2x / week   PT Treatment/Interventions ADLs/Self Care Home Management;Cryotherapy;Electrical Stimulation;Moist Heat;Traction;Patient/family education;Therapeutic exercise;Therapeutic activities;Manual techniques   PT Next Visit Plan work on flexibility, core work, may have to go slow as he was having worse pain with activities   Consulted and Agree with Plan of Care Patient      Patient will benefit from skilled therapeutic intervention in order to improve the following deficits and impairments:  Decreased activity tolerance, Decreased strength, Postural dysfunction, Improper body mechanics, Impaired flexibility, Pain, Decreased range of motion, Difficulty walking  Visit Diagnosis: Acute bilateral low back pain without sciatica - Plan: PT plan  of care cert/re-cert  Muscle spasm of back - Plan: PT plan of care cert/re-cert     Problem List Patient Active Problem List  Diagnosis Date Noted  . Breast skin changes 01/18/2017  . Seasonal allergic rhinitis 01/18/2017  . Acute low back pain 01/18/2017    Jearld Lesch., PT 06/18/2017, 3:58 PM  [facility] [address] [place], [place], [address] Phone: [phone]   Fax:  [phone]  Name: Marcus Bullock MRN: [id number] Date of Birth: 09/11/1986

## 2017-06-27 ENCOUNTER — Ambulatory Visit: Payer: BC Managed Care – PPO | Admitting: Physical Therapy

## 2017-07-02 ENCOUNTER — Ambulatory Visit: Payer: BC Managed Care – PPO | Admitting: Physical Therapy

## 2017-07-05 ENCOUNTER — Ambulatory Visit (INDEPENDENT_AMBULATORY_CARE_PROVIDER_SITE_OTHER): Payer: BC Managed Care – PPO | Admitting: Orthopedic Surgery

## 2017-07-05 ENCOUNTER — Encounter (INDEPENDENT_AMBULATORY_CARE_PROVIDER_SITE_OTHER): Payer: Self-pay | Admitting: Orthopedic Surgery

## 2017-07-05 DIAGNOSIS — M545 Low back pain: Secondary | ICD-10-CM | POA: Diagnosis not present

## 2017-07-05 DIAGNOSIS — G8929 Other chronic pain: Secondary | ICD-10-CM | POA: Diagnosis not present

## 2017-07-07 NOTE — Progress Notes (Signed)
Office Visit Note   Patient: Marcus Bullock           Date of Birth: 1986/02/11           MRN: [id number] Visit Date: 07/05/2017 Requested by: Terressa Koyanagi, DO [address] [place], [place] [address] PCP: Terressa Koyanagi, DO  Subjective: Chief Complaint  Patient presents with  . Lower Back - Pain    HPI: Marcus Bullock is a 31 year old patient with low back pain.  Describes no injury.  Been going on for 9 months.  Reports constant pain without radiation into the legs.  He denies any numbness and tingling.  He has been in physical therapy as well.  Has taken some over-the-counter medication for pain but it hasn't helped much.  He teaches fourth grade.  Hurts him to stand for long periods of time.  He does have daily symptoms.  He does not workout as much as he used to only one time in the past 4 months.  Describes a nagging-type pain.  He was recommended to stretch more which he has been doing but his symptoms persist.              ROS: All systems reviewed are negative as they relate to the chief complaint within the history of present illness.  Patient denies  fevers or chills.   Assessment & Plan: Visit Diagnoses:  1. Chronic bilateral low back pain without sciatica     Plan: Impression is low back pain with relatively normal radiographs reviewed on the computer.  No real radicular symptoms.  He may have pars stress fractures as he has done a lot of running in the past.  Alternatively this could be a central disc or SI joint mediated pathologic process.  He denies any other joint complaints.  Doesn't have much in terms of restricted range of motion with flexion extension or chest expansion.  Needs MRI of lumbar spine to evaluate central low back pain.  I will see him back after that study  Follow-Up Instructions: Return for after MRI.   Orders:  No orders of the defined types were placed in this encounter.  No orders of the defined types were placed in this  encounter.     Procedures: No procedures performed   Clinical Data: No additional findings.  Objective: Vital Signs: There were no vitals taken for this visit.  Physical Exam:   Constitutional: Patient appears well-developed HEENT:  Head: Normocephalic Eyes:EOM are normal Neck: Normal range of motion Cardiovascular: Normal rate Pulmonary/chest: Effort normal Neurologic: Patient is alert Skin: Skin is warm Psychiatric: Patient has normal mood and affect    Ortho Exam: Orthopedic exam demonstrates a fit-appearing individual with good flexibility and extension and flexion.  No trochanteric tenderness is noted.  No real joint swelling or warmth in the wrists elbows knees or ankles.  No nerve root tension signs.  Palpable pedal pulses.  Symmetric reflexes with negative Babinski negative clonus in the lower extremity.  No paresthesias L1 S1 bilaterally.  Specialty Comments:  No specialty comments available.  Imaging: No results found.   PMFS History: Patient Active Problem List   Diagnosis Date Noted  . Breast skin changes 01/18/2017  . Seasonal allergic rhinitis 01/18/2017  . Acute low back pain 01/18/2017   Past Medical History:  Diagnosis Date  . Left ankle sprain   . 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 Alcohol abuse Father   . Heart disease Maternal Grandmother   . Cancer Paternal Grandmother        breast    No past surgical history on file. Social History   Occupational History  . Not on file.   Social History Main Topics  . Smoking status: Never Smoker  . Smokeless tobacco: Never Used  . Alcohol use Yes     Comment: under safe drinking levels  . Drug use: Yes    Types: Marijuana  . Sexual activity: Yes     Comment: male partner

## 2017-07-09 ENCOUNTER — Ambulatory Visit: Payer: BC Managed Care – PPO | Attending: Family Medicine | Admitting: Physical Therapy

## 2017-07-09 ENCOUNTER — Encounter: Payer: Self-pay | Admitting: Physical Therapy

## 2017-07-09 DIAGNOSIS — M62830 Muscle spasm of back: Secondary | ICD-10-CM | POA: Diagnosis present

## 2017-07-09 DIAGNOSIS — M545 Low back pain, unspecified: Secondary | ICD-10-CM

## 2017-07-09 NOTE — Therapy (Signed)
[facility] [address] [place], [place], [address] Phone: [phone]   Fax:  [phone]  Physical Therapy Treatment  Patient Details  Name: BRIA PORTALES MRN: [id number] Date of Birth: 27-Mar-1986 Referring Provider: Kriste Basque  Encounter Date: 07/09/2017      PT End of Session - 07/09/17 1602    Visit Number 2   Date for PT Re-Evaluation 08/18/17   PT Start Time 1515   PT Stop Time 1614   PT Time Calculation (min) 59 min   Activity Tolerance Patient tolerated treatment well   Behavior During Therapy [facility] for tasks assessed/performed      Past Medical History:  Diagnosis Date  . Left ankle sprain   . 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 No pertinent surgical history.  There were no vitals filed for this visit.      Subjective Assessment - 07/09/17 1518    Subjective Pt reports that his RLE felt a little numb today.    Currently in Pain? Yes   Pain Score 4    Pain Location Leg                         [facility] Adult PT Treatment/Exercise - 07/09/17 0001      Exercises   Exercises Lumbar;Knee/Hip     Lumbar Exercises: Stretches   Passive Hamstring Stretch 4 reps;10 seconds   Single Knee to Chest Stretch 3 reps;10 seconds   Double Knee to Chest Stretch 10 seconds;1 rep   Lower Trunk Rotation 3 reps;10 seconds   Piriformis Stretch 3 reps;10 seconds     Lumbar Exercises: Machines for Strengthening   Cybex Knee Flexion 25lb 2x10    Other Lumbar Machine Exercise Seated Rows and lats 35lb 2x10     Lumbar Exercises: Seated   Other Seated Lumbar Exercises Iso abs on physo ball 3'' x10      Lumbar Exercises: Supine   Bridge 10 reps;Compliant;2 seconds   Other Supine Lumbar Exercises LE on physo ball bridges; K2C 2x10      Modalities   Modalities Electrical Stimulation;Moist Heat     Moist Heat Therapy   Number  Minutes Moist Heat 15 Minutes   Moist Heat Location Lumbar Spine     Electrical Stimulation   Electrical Stimulation Location Lumbar   Electrical Stimulation Action --  IFC   Electrical Stimulation Parameters supine   Electrical Stimulation Goals Pain                  PT Short Term Goals - 06/18/17 1556      PT SHORT TERM GOAL #1   Title independent with initial HEP   Time 2   Period Weeks   Status New           PT Long Term Goals - 06/18/17 1556      PT LONG TERM GOAL #1   Title understand proper posture and body mechanics   Time 8   Period Weeks   Status New     PT LONG TERM GOAL #2   Title decrease pain 50%   Time 8   Period Weeks   Status New     PT LONG TERM GOAL #3   Title increase lumbar ROM to WNL's   Time 8   Period Weeks   Status New  PT LONG TERM GOAL #4   Title return to gym without difficulty   Time 8   Period Weeks   Status New               Plan - 07/09/17 1603    Clinical Impression Statement Pt with bilat LE and trunk tightness noted with stretching. Pt reports coe weakness with supine interventions. Goo strength and ROM with seated lats and rows. Repots that he does not like to do LE HS curls.   PT Frequency 2x / week   PT Treatment/Interventions ADLs/Self Care Home Management;Cryotherapy;Electrical Stimulation;Moist Heat;Traction;Patient/family education;Therapeutic exercise;Therapeutic activities;Manual techniques   PT Next Visit Plan work on flexibility, core work, may have to go slow as he was having worse pain with activities      Patient will benefit from skilled therapeutic intervention in order to improve the following deficits and impairments:  Decreased activity tolerance, Decreased strength, Postural dysfunction, Improper body mechanics, Impaired flexibility, Pain, Decreased range of motion, Difficulty walking  Visit Diagnosis: Muscle spasm of back  Acute bilateral low back pain without  sciatica     Problem List Patient Active Problem List   Diagnosis Date Noted  . Breast skin changes 01/18/2017  . Seasonal allergic rhinitis 01/18/2017  . Acute low back pain 01/18/2017    Grayce Sessions, PTA 07/09/2017, 4:06 PM  [facility] [address] [place], [place], [address] Phone: [phone]   Fax:  [phone]  Name: JEFREY RABURN MRN: [id number] Date of Birth: 05/03/1986

## 2017-07-16 ENCOUNTER — Ambulatory Visit: Payer: BC Managed Care – PPO | Admitting: Physical Therapy

## 2017-07-23 ENCOUNTER — Inpatient Hospital Stay: Admission: RE | Admit: 2017-07-23 | Payer: BC Managed Care – PPO | Source: Ambulatory Visit

## 2017-07-23 ENCOUNTER — Ambulatory Visit: Payer: BC Managed Care – PPO | Admitting: Physical Therapy

## 2017-07-30 ENCOUNTER — Ambulatory Visit: Payer: BC Managed Care – PPO | Admitting: Physical Therapy

## 2017-12-31 ENCOUNTER — Ambulatory Visit: Payer: BC Managed Care – PPO | Admitting: Family Medicine

## 2017-12-31 ENCOUNTER — Encounter: Payer: Self-pay | Admitting: Family Medicine

## 2017-12-31 VITALS — BP 100/80 | HR 83 | Temp 98.7°F | Ht 70.5 in | Wt 189.0 lb

## 2017-12-31 DIAGNOSIS — G8929 Other chronic pain: Secondary | ICD-10-CM | POA: Diagnosis not present

## 2017-12-31 DIAGNOSIS — M5441 Lumbago with sciatica, right side: Secondary | ICD-10-CM

## 2017-12-31 NOTE — Progress Notes (Signed)
  HPI:  Using dictation device. Unfortunately this device frequently misinterprets words/phrases.  Marcus Bullock is a pleasant [age] year old here for back pain: -Chronic and intermittent -Worsening the last week or so after running in gym workouts -He tries to stay active as this actually helps his pain -Pain is in low back, radiates to right-posterior leg at times, moderate to severe with flares, mild at other times -He saw and orthopedic specialist about this several months ago and was going to have MRI, but then decided not to due to cost, he is considering going ahead and doing this -He also is considering other treatments such as chiropractic care -Prefers not to take medicines, feels muscle relaxer was not helpful -Does take some Tylenol from time to time -no fevers, malaise, loss of bb function, unexplained wt loss, etc  ROS: See pertinent positives and negatives per HPI.  Past Medical History:  Diagnosis Date  . Left ankle sprain   . 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 No pertinent surgical history.  Family History  Problem Relation Age of Onset  . Alcohol abuse Father   . Heart disease Maternal Grandmother   . Cancer Paternal Grandmother        breast    SOCIAL HX: no reported changes  No current outpatient medications on file.  EXAM:  Vitals:   12/31/17 1118  BP: 100/80  Pulse: 83  Temp: 98.7 F (37.1 C)    Body mass index is 26.74 kg/m.  GENERAL: vitals reviewed and listed above, alert, oriented, appears well hydrated and in no acute distress  HEENT: atraumatic, conjunttiva clear, no obvious abnormalities on inspection of external nose and ears  NECK: no obvious masses on inspection  LUNGS: clear to auscultation bilaterally, no wheezes, rales or rhonchi, good air movement  CV: HRRR, no peripheral edema  MS: moves all extremities without noticeable abnormality Normal Gait Normal  inspection of back, no obvious scoliosis or leg length descrepancy No bony TTP Soft tissue TTP at: R paraspinal muscles lower lumbar -/+ tests: -SLRT, -CLRT, Normal muscle strength, sensation to light touch and DTRs in LEs bilaterally  PSYCH: pleasant and cooperative, no obvious depression or anxiety  ASSESSMENT AND PLAN:  Discussed the following assessment and plan:  Chronic right-sided low back pain with right-sided sciatica  -plain films ok -discussed potential etiologies -seeing ortho and considering MRI - he will call them if he decides to do - advised he go ahead and do given chronicity of problems -discussed tx options - he is considering OMT, PT, etc -in interim will try heat, aleve, tiger balm, gentle activity -follow up 1 month if persists, sooner as needed   Patient Instructions  - gentle activities during acute flare - walking, cycling, etc  -aleve per instructions, heat and/or tiger balm for pain as needed  -contact your orthopedic specialist if you change your mind about getting the MRI   -consider osteopathic treatment - check with your insurance and give us a call if you want to set this up  -follow up in 1 month if any persistent concerns    Terressa KoyanagiHannah R Zera Markwardt, DO

## 2017-12-31 NOTE — Patient Instructions (Signed)
-   gentle activities during acute flare - walking, cycling, etc  -aleve per instructions, heat and/or tiger balm for pain as needed  -contact your orthopedic specialist if you change your mind about getting the MRI   -consider osteopathic treatment - check with your insurance and give us a call if you want to set this up  -follow up in 1 month if any persistent concerns

## 2018-01-01 ENCOUNTER — Telehealth (INDEPENDENT_AMBULATORY_CARE_PROVIDER_SITE_OTHER): Payer: Self-pay | Admitting: *Deleted

## 2018-01-01 ENCOUNTER — Telehealth: Payer: Self-pay | Admitting: Family Medicine

## 2018-01-01 DIAGNOSIS — M545 Low back pain: Secondary | ICD-10-CM

## 2018-01-01 NOTE — Telephone Encounter (Signed)
Ok to re-order scan or need ROV?

## 2018-01-01 NOTE — Telephone Encounter (Signed)
Unable to leave a message due to voicemail being full.  CRM created in case and if pt calls back he should ask his insurance for coverage of CPT codes: [id number], [phone] or [phone].

## 2018-01-01 NOTE — Telephone Encounter (Signed)
Noted  

## 2018-01-01 NOTE — Telephone Encounter (Signed)
Dr August Saucerean said ok to scan. I put new order in for scan.

## 2018-01-01 NOTE — Telephone Encounter (Signed)
Ok to scan

## 2018-01-01 NOTE — Telephone Encounter (Signed)
Copied from CRM [phone]. Topic: General - Other >> Jan 01, 2018  9:02 AM Maia Pettiesrtiz, Kristie S wrote: Reason for CRM: pt called regarding suggested osteopathic treatment. Insurance is asking what codes would be billed to notify him if covered. Please advise.

## 2018-01-01 NOTE — Telephone Encounter (Signed)
Pt called stating he was seen in office on 07/05/17 and was ordered an MRI Lumbar spine, pt could not proceed with it at the time d/t financial reasons, now would like to go ahead with MRI. Since pt was last seen in Oct 2018, ok to order MRI again?

## 2018-01-05 ENCOUNTER — Ambulatory Visit
Admission: RE | Admit: 2018-01-05 | Discharge: 2018-01-05 | Disposition: A | Discharge status: Home / Self Care | Payer: BC Managed Care – PPO | Source: Ambulatory Visit | Attending: Orthopedic Surgery | Admitting: Orthopedic Surgery

## 2018-01-05 DIAGNOSIS — M545 Low back pain: Secondary | ICD-10-CM

## 2018-01-07 ENCOUNTER — Encounter (INDEPENDENT_AMBULATORY_CARE_PROVIDER_SITE_OTHER): Payer: Self-pay | Admitting: Orthopedic Surgery

## 2018-01-07 ENCOUNTER — Ambulatory Visit (INDEPENDENT_AMBULATORY_CARE_PROVIDER_SITE_OTHER): Payer: BC Managed Care – PPO | Admitting: Orthopedic Surgery

## 2018-01-07 DIAGNOSIS — M545 Low back pain: Secondary | ICD-10-CM | POA: Diagnosis not present

## 2018-01-08 ENCOUNTER — Telehealth (INDEPENDENT_AMBULATORY_CARE_PROVIDER_SITE_OTHER): Payer: Self-pay | Admitting: Physical Medicine and Rehabilitation

## 2018-01-08 DIAGNOSIS — M545 Low back pain: Secondary | ICD-10-CM

## 2018-01-08 NOTE — Telephone Encounter (Signed)
Did you want him to have SI joint injection?

## 2018-01-08 NOTE — Telephone Encounter (Signed)
Lumbar esi and si after fred evals him he can decide

## 2018-01-08 NOTE — Telephone Encounter (Signed)
Patient scheduled for OMT on 4/11.

## 2018-01-09 NOTE — Telephone Encounter (Signed)
I put the order in. Just FYI patient was very anxious about having multiple visits and having to pay $80 copay. I have made him aware that there is not anything that we can do about his copay, that is set by his insurance.

## 2018-01-10 ENCOUNTER — Ambulatory Visit: Payer: BC Managed Care – PPO | Admitting: Family Medicine

## 2018-01-10 ENCOUNTER — Encounter (INDEPENDENT_AMBULATORY_CARE_PROVIDER_SITE_OTHER): Payer: Self-pay | Admitting: Orthopedic Surgery

## 2018-01-10 ENCOUNTER — Telehealth (INDEPENDENT_AMBULATORY_CARE_PROVIDER_SITE_OTHER): Payer: Self-pay | Admitting: Orthopedic Surgery

## 2018-01-10 ENCOUNTER — Encounter: Payer: Self-pay | Admitting: Family Medicine

## 2018-01-10 VITALS — BP 100/62 | HR 89 | Temp 98.9°F | Ht 70.5 in | Wt 190.8 lb

## 2018-01-10 DIAGNOSIS — M9906 Segmental and somatic dysfunction of lower extremity: Secondary | ICD-10-CM

## 2018-01-10 DIAGNOSIS — M9905 Segmental and somatic dysfunction of pelvic region: Secondary | ICD-10-CM

## 2018-01-10 DIAGNOSIS — M545 Low back pain: Secondary | ICD-10-CM | POA: Diagnosis not present

## 2018-01-10 DIAGNOSIS — M9903 Segmental and somatic dysfunction of lumbar region: Secondary | ICD-10-CM | POA: Diagnosis not present

## 2018-01-10 DIAGNOSIS — M9902 Segmental and somatic dysfunction of thoracic region: Secondary | ICD-10-CM | POA: Diagnosis not present

## 2018-01-10 DIAGNOSIS — G8929 Other chronic pain: Secondary | ICD-10-CM

## 2018-01-10 DIAGNOSIS — M9901 Segmental and somatic dysfunction of cervical region: Secondary | ICD-10-CM | POA: Diagnosis not present

## 2018-01-10 NOTE — Telephone Encounter (Signed)
IC patient back and advised see other documentation.

## 2018-01-10 NOTE — Telephone Encounter (Signed)
Patient called asking if his blood results have come back yet or not? CB # [phone]

## 2018-01-10 NOTE — Patient Instructions (Signed)
BEFORE YOU LEAVE: -follow up: 1-2 weeks (OMT)  Try out this video daily:  "Independence from Pain 7-minute Video" - [email or web address]/https://www.youtube.com/watch?v=[email or web address]   Continue follow up with your orthopedic specialist as planned  Heat and tiger balm as needed.

## 2018-01-10 NOTE — Progress Notes (Signed)
Please call patient with results. Thanks all labs negative for rheumatoid arthritis and things of that nature.  I think a back injection is the logical next step

## 2018-01-10 NOTE — Progress Notes (Signed)
Office Visit Note   Patient: Marcus Bullock           Date of Birth: 1986/08/24           MRN: [id number] Visit Date: 01/07/2018 Requested by: Marcus Kern, DO [address] [place], [place] [address] PCP: Marcus Kern, DO  Subjective: Chief Complaint  Patient presents with  . Lower Back - Follow-up    HPI: Marcus Bullock is a patient with low back pain.  Since I have seen him he has had a lumbar spine MRI which is normal.  He is having continued fairly incapacitating back pain.  Affects both the left and right hand sides.  Tried pain pills last week but did not help.  No history of blood in his urine.  Occasionally radiates around.  He tried to lift 2 weeks ago but it flared up again.  Denies any other joint complaints.              ROS: All systems reviewed are negative as they relate to the chief complaint within the history of present illness.  Patient denies  fevers or chills.   Assessment & Plan: Visit Diagnoses:  1. Low back pain, unspecified back pain laterality, unspecified chronicity, with sciatica presence unspecified     Plan: Impression is low back pain.  Normal MRI scan.  Laboratory studies ordered at this clinic visit include sed rate C-reactive protein and ANA anti-CCP antibody and HLA-B27.  At the time of this dictation those studies are normal.  I would like to refer him to Dr. Ernestina Bullock for evaluation and second opinion regarding the source and cause of his pain.  May need to consider SI joint injection or diagnostic L spine injection.  I do not really have anything else to offer him.  I will see him back as needed.  If Dr. Ernestina Bullock cannot really figure this out either then this is something he may have to live with or he could seek an opinion elsewhere.  Follow-Up Instructions: No follow-ups on file.   Orders:  Orders Placed This Encounter  Procedures  . Sed Rate (ESR)  . CRP High sensitivity  . Antinuclear Antib (ANA)  . Cyclic citrul peptide antibody,  IgG  . HLA B*5701   No orders of the defined types were placed in this encounter.     Procedures: No procedures performed   Clinical Data: No additional findings.  Objective: Vital Signs: There were no vitals taken for this visit.  Physical Exam:   Constitutional: Patient appears well-developed HEENT:  Head: Normocephalic Eyes:EOM are normal Neck: Normal range of motion Cardiovascular: Normal rate Pulmonary/chest: Effort normal Neurologic: Patient is alert Skin: Skin is warm Psychiatric: Patient has normal mood and affect    Ortho Exam: Orthopedic exam demonstrates physically fit appearing individual with pretty good flexibility.  No nerve root tension signs today.  No muscle atrophy.  Normal chest expansion with breathing.  Palpable pedal pulses.  Some pain with forward and lateral bending.  No paresthesias L1 S1 bilaterally.  Negative clonus bilaterally.  Specialty Comments:  No specialty comments available.  Imaging: No results found.   PMFS History: Patient Active Problem List   Diagnosis Date Noted  . Breast skin changes 01/18/2017  . Seasonal allergic rhinitis 01/18/2017  . Acute low back pain 01/18/2017   Past Medical History:  Diagnosis Date  . Left ankle sprain   . Skin irritation    has seen derm and allergist - dx cholinergic  urticaria    Family History  Problem Relation Age of Onset  . Alcohol abuse Father   . Heart disease Maternal Grandmother   . Cancer Paternal Grandmother        breast    History reviewed. No pertinent surgical history. Social History   Occupational History  . Not on file  Tobacco Use  . Smoking status: Never Smoker  . Smokeless tobacco: Never Used  Substance and Sexual Activity  . Alcohol use: Yes    Comment: under safe drinking levels  . Drug use: Yes    Types: Marijuana  . Sexual activity: Yes    Comment: male partner
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
## 2018-01-12 LAB — HIGH SENSITIVITY CRP: hs-CRP: 0.4 mg/L

## 2018-01-12 LAB — CYCLIC CITRUL PEPTIDE ANTIBODY, IGG

## 2018-01-12 LAB — HLA B*5701: HLA-B*5701 w/rflx HLA-B High: NEGATIVE

## 2018-01-12 LAB — ANA: ANA: NEGATIVE

## 2018-01-12 LAB — SEDIMENTATION RATE: Sed Rate: 2 mm/h (ref 0–15)

## 2018-01-21 NOTE — Progress Notes (Signed)
HPI:  Using dictation device. Unfortunately this device frequently misinterprets words/phrases.  Here for CPE:  -Concerns and/or follow up today:  Reports his back pain is about the same.  He is seeing a specialist for this and will have an injection in a few days.  He wonders if he can get a refill on the Flexeril in the interim.  Tiger balm and heat seem to help.  He continues to worry about whether there is something wrong with his kidneys or internally.  Denies fevers, malaise, hematuria, change in bowels, unexplained weight loss or any other systemic symptoms. He also wonders if the pain in his left ankle could be contributing to his back.  He injured it about a year ago with what he says was a severe ankle sprain.  He was seen in urgent care and had negative x-rays at the time.  He reports over since then he has pain in that ankle when he puts weight on it and he feels like there is something wrong with the ankle.  He is interested in seeing sports medicine about this.  -Diet: variety of foods, balance and well rounded -Exercise: regular exercise -Diabetes and Dyslipidemia Screening: -Hx of HTN: no -Vaccines: UTD -wants STI testing, Hep C screening (if born [address]1946[address]1966): yes -FH colon or prstate ca: see FH Last colon cancer screening: Not applicable Last prostate ca screening not applicable -Alcohol, Tobacco, drug use: see social history  Review of Systems - no fevers, unintentional weight loss, vision loss, hearing loss, chest pain, sob, hemoptysis, melena, hematochezia, hematuria, genital discharge, changing or concerning skin lesions, bleeding, bruising, loc, thoughts of self harm or SI  Past Medical History:  Diagnosis Date  . Left ankle sprain   . 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 No pertinent surgical history.  Family History  Problem Relation Age of Onset  . Alcohol abuse Father   . Heart disease Maternal  Grandmother   . Cancer Paternal Grandmother        breast    Social History   Socioeconomic History  . Marital status: Single    Spouse name: Not on file  . Number of children: Not on file  . Years of education: Not on file  . Highest education level: Not on file  Occupational History  . Not on file  Social Needs  . Financial resource strain: Not on file  . Food insecurity:    Worry: Not on file    Inability: Not on file  . Transportation needs:    Medical: Not on file    Non-medical: Not on file  Tobacco Use  . Smoking status: Never Smoker  . Smokeless tobacco: Never Used  Substance and Sexual Activity  . Alcohol use: Yes    Comment: under safe drinking levels  . Drug use: Yes    Types: Marijuana  . Sexual activity: Yes    Comment: male partner  Lifestyle  . Physical activity:    Days per week: Not on file    Minutes per session: Not on file  . Stress: Not on file  Relationships  . Social connections:    Talks on phone: Not on file    Gets together: Not on file    Attends religious service: Not on file    Active member of club or organization: Not on file    Attends meetings of clubs or organizations: Not on file  Relationship status: Not on file  Other Topics Concern  . 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  cyclobenzaprine (FLEXERIL) 10 MG tablet, Nightly as needed for muscle spasm, Disp: 20 tablet, Rfl: 0  EXAM:  Vitals:   01/22/18 1309  BP: 104/60  Pulse: 91  Temp: 98.3 F (36.8 C)  TempSrc: Oral  Weight: 189 lb 6.4 oz (85.9 kg)  Height: 5\' 10"  (1.778 m)    Estimated body mass index is 27.18 kg/m as calculated from the following:   Height as of this encounter: 5\' 10"  (1.778 m).   Weight as of this encounter: 189 lb 6.4 oz (85.9 kg).  GENERAL: vitals reviewed and listed below, alert, oriented,  appears well hydrated and in no acute distress  HEENT: head atraumatic, PERRLA, normal appearance of eyes, ears, nose and mouth. moist mucus membranes.  NECK: supple, no masses or lymphadenopathy  LUNGS: clear to auscultation bilaterally, no rales, rhonchi or wheeze  CV: HRRR, no peripheral edema or cyanosis, normal pedal pulses  ABDOMEN: bowel sounds normal, soft, non tender to palpation, no masses, no rebound or guarding  GU: Deferred  SKIN: no rash or abnormal lesions  MS: normal gait, moves all extremities normally  NEURO: normal gait, speech and thought processing grossly intact, muscle tone grossly intact throughout  PSYCH: normal affect, pleasant and cooperative  ASSESSMENT AND PLAN:  Discussed the following assessment and plan:  PREVENTIVE EXAM: -Discussed and advised all [place] preventive services health task force level A and B recommendations for age, sex and risks. -Advised at least 150 minutes of exercise per week and a healthy diet  -labs, studies and vaccines per orders this encounter - Hemoglobin A1c - Lipid panel  2. Screening for depression See PHQ 9  3. Chronic low back pain, unspecified back pain laterality, with sciatica presence unspecified -Seeing specialist -He could do more OMT if he wishes -Flexeril sent in -Advised if not improving we could do a CT of the abdomen and pelvis per his concerns, he wants to think about this and will let us know  4. Chronic pain of left ankle - Ambulatory referral to Sports Medicine  5. Routine screening for STI (sexually transmitted infection) - HIV antibody - RPR - Urine cytology ancillary only   There are no Patient Instructions on file for this visit.  No follow-ups on file.   Terressa Koyanagi, DO

## 2018-01-22 ENCOUNTER — Other Ambulatory Visit (HOSPITAL_COMMUNITY)
Admission: RE | Admit: 2018-01-22 | Discharge: 2018-01-22 | Disposition: A | Discharge status: Home / Self Care | Payer: BC Managed Care – PPO | Source: Ambulatory Visit | Attending: Family Medicine | Admitting: Family Medicine

## 2018-01-22 ENCOUNTER — Encounter: Payer: Self-pay | Admitting: Family Medicine

## 2018-01-22 ENCOUNTER — Ambulatory Visit (INDEPENDENT_AMBULATORY_CARE_PROVIDER_SITE_OTHER): Payer: BC Managed Care – PPO | Admitting: Family Medicine

## 2018-01-22 VITALS — BP 104/60 | HR 91 | Temp 98.3°F | Ht 70.0 in | Wt 189.4 lb

## 2018-01-22 DIAGNOSIS — G8929 Other chronic pain: Secondary | ICD-10-CM | POA: Diagnosis not present

## 2018-01-22 DIAGNOSIS — Z0000 Encounter for general adult medical examination without abnormal findings: Secondary | ICD-10-CM | POA: Diagnosis not present

## 2018-01-22 DIAGNOSIS — Z1331 Encounter for screening for depression: Secondary | ICD-10-CM

## 2018-01-22 DIAGNOSIS — Z113 Encounter for screening for infections with a predominantly sexual mode of transmission: Secondary | ICD-10-CM | POA: Diagnosis not present

## 2018-01-22 DIAGNOSIS — M545 Low back pain: Secondary | ICD-10-CM

## 2018-01-22 DIAGNOSIS — M25572 Pain in left ankle and joints of left foot: Secondary | ICD-10-CM | POA: Diagnosis not present

## 2018-01-22 LAB — LIPID PANEL
CHOLESTEROL: 145 mg/dL (ref 0–200)
HDL: 61.6 mg/dL (ref >39.00)
LDL Cholesterol: 72 mg/dL (ref 0–99)
NonHDL: 83.1
Total CHOL/HDL Ratio: 2
Triglycerides: 58 mg/dL (ref 0.0–149.0)
VLDL: 11.6 mg/dL (ref 0.0–40.0)

## 2018-01-22 LAB — HEMOGLOBIN A1C: Hgb A1c MFr Bld: 4.5 % — ABNORMAL LOW (ref 4.6–6.5)

## 2018-01-22 MED ORDER — CYCLOBENZAPRINE HCL 10 MG PO TABS: ORAL_TABLET | ORAL | 0 refills | Status: DC

## 2018-01-22 NOTE — Patient Instructions (Signed)
BEFORE YOU LEAVE: -labs (urine ancillary - I placed order for GC/Chlam/trich) -follow up: yearly for physical and as needed  We have ordered labs or studies at this visit. It can take up to 1-2 weeks for results and processing. IF results require follow up or explanation, we will call you with instructions. Clinically stable results will be released to your [facility] If you have not heard from [place] or cannot find your results in [facility] in 2 weeks please contact our office at [phone].  If you are not yet signed up for [facility], please consider signing up.  -We placed a referral for you as discussed to sports medicine per your request for the ankle issues. It usually takes about 1-2 weeks to process and schedule this referral. If you have not heard from [place] regarding this appointment in 2 weeks please contact our office.    Preventive Care 18-39 Years, Male Preventive care refers to lifestyle choices and visits with your health care provider that can promote health and wellness. What does preventive care include?  A yearly physical exam. This is also called an annual well check.  Dental exams once or twice a year.  Routine eye exams. Ask your health care provider how often you should have your eyes checked.  Personal lifestyle choices, including: ? Daily care of your teeth and gums. ? Regular physical activity. ? Eating a healthy diet. ? Avoiding tobacco and drug use. ? Limiting alcohol use. ? Practicing safe sex. What happens during an annual well check? The services and screenings done by your health care provider during your annual well check will depend on your age, overall health, lifestyle risk factors, and family history of disease. Counseling Your health care provider may ask you questions about your:  Alcohol use.  Tobacco use.  Drug use.  Emotional well-being.  Home and relationship well-being.  Sexual activity.  Eating habits.  Work and work  Statistician.  Screening You may have the following tests or measurements:  Height, weight, and BMI.  Blood pressure.  Lipid and cholesterol levels. These may be checked every 5 years starting at age 72.  Diabetes screening. This is done by checking your blood sugar (glucose) after you have not eaten for a while (fasting).  Skin check.  Hepatitis C blood test.  Hepatitis B blood test.  Sexually transmitted disease (STD) testing.  Discuss your test results, treatment options, and if necessary, the need for more tests with your health care provider. Vaccines Your health care provider may recommend certain vaccines, such as:  Influenza vaccine. This is recommended every year.  Tetanus, diphtheria, and acellular pertussis (Tdap, Td) vaccine. You may need a Td booster every 10 years.  Varicella vaccine. You may need this if you have not been vaccinated.  HPV vaccine. If you are 78 or younger, you may need three doses over 6 months.  Measles, mumps, and rubella (MMR) vaccine. You may need at least one dose of MMR.You may also need a second dose.  Pneumococcal 13-valent conjugate (PCV13) vaccine. You may need this if you have certain conditions and have not been vaccinated.  Pneumococcal polysaccharide (PPSV23) vaccine. You may need one or two doses if you smoke cigarettes or if you have certain conditions.  Meningococcal vaccine. One dose is recommended if you are age 4-21 years and a first-year college student living in a residence hall, or if you have one of several medical conditions. You may also need additional booster doses.  Hepatitis A vaccine. You  may need this if you have certain conditions or if you travel or work in places where you may be exposed to hepatitis A.  Hepatitis B vaccine. You may need this if you have certain conditions or if you travel or work in places where you may be exposed to hepatitis B.  Haemophilus influenzae type b (Hib) vaccine. You may need  this if you have certain risk factors.  Talk to your health care provider about which screenings and vaccines you need and how often you need them. This information is not intended to replace advice given to you by your health care provider. Make sure you discuss any questions you have with your health care provider. Document Released: 11/14/2001 Document Revised: 06/07/2016 Document Reviewed: 07/20/2015 Elsevier Interactive Patient Education  [employer].

## 2018-01-23 LAB — URINE CYTOLOGY ANCILLARY ONLY
Chlamydia: NEGATIVE
Neisseria Gonorrhea: NEGATIVE
Trichomonas: NEGATIVE

## 2018-01-23 LAB — HIV ANTIBODY (ROUTINE TESTING W REFLEX): HIV 1&2 Ab, 4th Generation: NONREACTIVE

## 2018-01-23 LAB — RPR: RPR Ser Ql: NONREACTIVE

## 2018-01-28 ENCOUNTER — Ambulatory Visit (INDEPENDENT_AMBULATORY_CARE_PROVIDER_SITE_OTHER): Payer: Self-pay

## 2018-01-28 ENCOUNTER — Ambulatory Visit (INDEPENDENT_AMBULATORY_CARE_PROVIDER_SITE_OTHER): Payer: BC Managed Care – PPO | Admitting: Physical Medicine and Rehabilitation

## 2018-01-28 ENCOUNTER — Encounter (INDEPENDENT_AMBULATORY_CARE_PROVIDER_SITE_OTHER): Payer: Self-pay | Admitting: Physical Medicine and Rehabilitation

## 2018-01-28 VITALS — BP 127/69

## 2018-01-28 DIAGNOSIS — G8929 Other chronic pain: Secondary | ICD-10-CM | POA: Diagnosis not present

## 2018-01-28 DIAGNOSIS — M545 Low back pain: Secondary | ICD-10-CM | POA: Diagnosis not present

## 2018-01-28 DIAGNOSIS — M47816 Spondylosis without myelopathy or radiculopathy, lumbar region: Secondary | ICD-10-CM

## 2018-01-28 DIAGNOSIS — M461 Sacroiliitis, not elsewhere classified: Secondary | ICD-10-CM | POA: Diagnosis not present

## 2018-01-28 DIAGNOSIS — M7918 Myalgia, other site: Secondary | ICD-10-CM | POA: Diagnosis not present

## 2018-01-28 MED ORDER — METHYLPREDNISOLONE ACETATE 80 MG/ML IJ SUSP
80.0000 mg | Freq: Once | INTRAMUSCULAR | Status: AC
  Administered 2018-01-28: 80 mg

## 2018-01-28 NOTE — Progress Notes (Signed)
Numeric Pain Rating Scale and Functional Assessment Average Pain 5   In the last MONTH (on 0-10 scale) has pain interfered with the following?  1. General activity like being  able to carry out your everyday physical activities such as walking, climbing stairs, carrying groceries, or moving a chair?  Rating(0)    +Driver, -BT, -Dye Allergies.  

## 2018-01-28 NOTE — Progress Notes (Signed)
Marcus Bullock - 32 y.o. male MRN [id number]  Date of birth: [birth year]  Office Visit Note: Visit Date: 01/28/2018 PCP: Shirline Frees, NP Referred by: Terressa Koyanagi, DO  Subjective: Chief Complaint  Patient presents with  . Lower Back - Pain  . Right Leg - Pain  . Left Leg - Pain   HPI: Marcus Bullock is a 32 year oldande gentleman who comes in today at the request of Dr. Dorene Grebe for evaluation and management of his low back pain and bilateral hip and leg pain.  He has been followed pretty extensively by Dr. August Saucer for his orthopedic complaints.  Dr. August Saucer felt like this may be related to sacroiliac joint pathology or related to the lumbar spine.  He has been having this pain for over 2 years.  Has been followed by his primary care physician Dr. Selena Batten.  He is failed all manner of conservative care including anti-inflammatories and muscle relaxers and time and therapy.  MRI of the lumbar spine is reviewed with the patient and reviewed below.  Is a fairly normal MRI of the lumbar spine with very minimal curvature but no disc herniations or nerve compression.  His pain is basically worse with standing for a long time and he will get better with heat and using a local ointment.  He denies any paresthesias or focal weakness.  He denies any specific injury.   Review of Systems  Constitutional: Negative for chills, fever, malaise/fatigue and weight loss.  HENT: Negative for hearing loss and sinus pain.   Eyes: Negative for blurred vision, double vision and photophobia.  Respiratory: Negative for cough and shortness of breath.   Cardiovascular: Negative for chest pain, palpitations and leg swelling.  Gastrointestinal: Negative for abdominal pain, nausea and vomiting.  Genitourinary: Negative for flank pain.  Musculoskeletal: Positive for back pain. Negative for myalgias.  Skin: Negative for itching and rash.  Neurological: Negative for tremors, focal weakness and weakness.  Endo/Heme/Allergies:  Negative.   Psychiatric/Behavioral: Negative for depression.  All other systems reviewed and are negative.  Otherwise per HPI.  Assessment & Plan: Visit Diagnoses:  1. Spondylosis without myelopathy or radiculopathy, lumbar region   2. Chronic bilateral low back pain without sciatica   3. Myofascial pain syndrome     Plan: Findings:  32 year old male with chronic low back pain sometimes referring down the leg but without paresthesias.  Fairly normal MRI except for very minimal curvature.  I think his pain is more likely to be facet joint mediated referral pain versus myofascial pain.  He does not have much in the way of disc issues although cannot rule that out completely with even a normal MRI.  He does not have any red flag complaints.  I think the best approach is diagnostic facet joint blocks.  Potentially could be a candidate for radiofrequency ablation if it was just very beneficial but not long lived.  If is not very helpful that I would suggest regrouping with physical therapy for manual type medicine as well as dry needling.  Could look at lumbar myelogram if really felt like this was something being missed by the MRI.  Lastly I am not sure this would be sacroiliac joint mediated pain given that the patient is male and his pain really is above the waistline.    Meds & Orders:  Meds ordered this encounter  Medications  . methylPREDNISolone acetate (DEPO-MEDROL) injection 80 mg    Orders Placed This Encounter  Procedures  . Facet Injection  .  XR C-ARM NO REPORT    Follow-up: Return if symptoms worsen or fail to improve.   Procedures: No procedures performed  Lumbar Facet Joint Intra-Articular Injection(s) with Fluoroscopic Guidance  Patient: Marcus Bullock      Date of Birth: 1985/10/22 MRN: [id number] PCP: Shirline Frees, NP      Visit Date: 01/28/2018   Universal Protocol:    Date/Time: 01/28/2018  Consent Given By: the patient  Position: PRONE   Additional  Comments: Vital signs were monitored before and after the procedure. Patient was prepped and draped in the usual sterile fashion. The correct patient, procedure, and site was verified.   Injection Procedure Details:  Procedure Site One Meds Administered:  Meds ordered this encounter  Medications  . methylPREDNISolone acetate (DEPO-MEDROL) injection 80 mg     Laterality: Bilateral  Location/Site:  L4-L5  Needle size: 22 guage  Needle type: Spinal  Needle Placement: Articular  Findings:  -Comments: Excellent flow of contrast producing a partial arthrogram.  Procedure Details: The fluoroscope beam is vertically oriented in AP, and the inferior recess is visualized beneath the lower pole of the inferior apophyseal process, which represents the target point for needle insertion. When direct visualization is difficult the target point is located at the medial projection of the vertebral pedicle. The region overlying each aforementioned target is locally anesthetized with a 1 to 2 ml. volume of 1% Lidocaine without Epinephrine.   The spinal needle was inserted into each of the above mentioned facet joints using biplanar fluoroscopic guidance. A 0.25 to 0.5 ml. volume of Isovue-250 was injected and a partial facet joint arthrogram was obtained. A single spot film was obtained of the resulting arthrogram.    One to 1.25 ml of the steroid/anesthetic solution was then injected into each of the facet joints noted above.   Additional Comments:  The patient tolerated the procedure well Dressing: Band-Aid    Post-procedure details: Patient was observed during the procedure. Post-procedure instructions were reviewed.  Patient left the clinic in stable condition.    Clinical History: MRI LUMBAR SPINE WITHOUT CONTRAST  TECHNIQUE: Multiplanar, multisequence MR imaging of the lumbar spine was performed. No intravenous contrast was administered.  COMPARISON:   None.  FINDINGS: Segmentation:  5 lumbar type vertebral bodies assumed.  Alignment: Minimal curvature convex to the right with the apex at L2-3.  Vertebrae:  Normal  Conus medullaris and cauda equina: Conus extends to the T12-L1 level. Conus and cauda equina appear normal.  Paraspinal and other soft tissues: Normal  Disc levels:  No degenerative disc disease. No bulge or herniation. No facet arthropathy or pars defect. Wide patency of the canal and foramina.  IMPRESSION: Normal examination, with the exception of very mild curvature convex to the right. No degenerative changes. No stenosis. No cause of the presenting symptoms is identified.   Electronically Signed   By: Paulina Fusi M.D.   He reports that he has never smoked. He has never used smokeless tobacco.  Recent Labs    01/22/18 1400  HGBA1C 4.5*    Objective:  VS:  HT:    WT:   BMI:     BP:127/69  HR: bpm  TEMP: ( )  RESP:  Physical Exam  Constitutional: He is oriented to person, place, and time. He appears well-developed and well-nourished. No distress.  HENT:  Head: Normocephalic and atraumatic.  Eyes: Pupils are equal, round, and reactive to light. Conjunctivae are normal.  Neck: Normal range of motion. Neck supple.  Cardiovascular: Regular rhythm and intact distal pulses.  Pulmonary/Chest: Effort normal. No respiratory distress.  Musculoskeletal:  Patient ambulates without aid.  He does have pain with extension rotation of the lumbar spine concordant for his low back pain with facet joint loading.  He does have paraspinal tightness and taut bands.  He has no pain over the PSIS.  No pain over the greater trochanters.  He has a negative Patrick's exam bilaterally.  He has good distal strength.  Neurological: He is alert and oriented to person, place, and time. He exhibits normal muscle tone. Coordination normal.  Skin: Skin is warm and dry. No rash noted. No erythema.  Psychiatric: He has a  normal mood and affect.  Nursing note and vitals reviewed.   Ortho Exam Imaging: No results found.  Past Medical/Family/Surgical/Social History: Medications & Allergies reviewed per EMR, new medications updated. Patient Active Problem List   Diagnosis Date Noted  . Breast skin changes 01/18/2017  . Seasonal allergic rhinitis 01/18/2017  . Acute low back pain 01/18/2017   Past Medical History:  Diagnosis Date  . Left ankle sprain   . 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 Alcohol abuse Father   . Heart disease Maternal Grandmother   . Cancer Paternal Grandmother        breast   History reviewed. No pertinent surgical history. Social History   Occupational History  . Not on file  Tobacco Use  . Smoking status: Never Smoker  . Smokeless tobacco: Never Used  Substance and Sexual Activity  . Alcohol use: Yes    Comment: under safe drinking levels  . Drug use: Yes    Types: Marijuana  . Sexual activity: Yes    Comment: male partner

## 2018-01-28 NOTE — Patient Instructions (Signed)

## 2018-02-01 ENCOUNTER — Ambulatory Visit: Payer: BC Managed Care – PPO | Admitting: Sports Medicine

## 2018-02-04 ENCOUNTER — Encounter: Payer: Self-pay | Admitting: Family Medicine

## 2018-02-05 ENCOUNTER — Encounter: Payer: Self-pay | Admitting: Adult Health

## 2018-02-05 ENCOUNTER — Telehealth: Payer: Self-pay | Admitting: Family Medicine

## 2018-02-05 ENCOUNTER — Ambulatory Visit: Payer: BC Managed Care – PPO | Admitting: Adult Health

## 2018-02-05 VITALS — BP 118/70 | Temp 98.9°F | Wt 194.0 lb

## 2018-02-05 DIAGNOSIS — M545 Low back pain: Secondary | ICD-10-CM

## 2018-02-05 DIAGNOSIS — R8290 Unspecified abnormal findings in urine: Secondary | ICD-10-CM | POA: Diagnosis not present

## 2018-02-05 DIAGNOSIS — G8929 Other chronic pain: Secondary | ICD-10-CM

## 2018-02-05 LAB — POC URINALSYSI DIPSTICK (AUTOMATED)
Bilirubin, UA: NEGATIVE
GLUCOSE UA: NEGATIVE
Ketones, UA: NEGATIVE
Leukocytes, UA: NEGATIVE
NITRITE UA: NEGATIVE
PROTEIN UA: NEGATIVE
RBC UA: NEGATIVE
UROBILINOGEN UA: 0.2 U/dL
pH, UA: 6 (ref 5.0–8.0)

## 2018-02-05 NOTE — Progress Notes (Signed)
Subjective:    Patient ID: Marcus Bullock, male    DOB: 01/29/1986, 32 y.o.   MRN: [id number]  HPI  32 year old male who  has a past medical history of Left ankle sprain and Skin irritation. He is a patient of Dr. Selena Batten, who I am seeing today for the chronic issue of lower back pain x 10 months. Pain is described as a " dull pain". He has had imaging, including xray, and MRI of lumbar spine, which were normal. He does report intermittent radiating pain to bilateral flanks and abnormal urine odor .  He is concerned about "something being wrong with my kidneys".  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  He is being seen by orthopedics and recently had to steroid injections into the lower lumbar spine.  Reports no relief from this  He does take a daily multivitamin  Review of Systems See HPI   Past Medical History:  Diagnosis Date  . Left ankle sprain   . 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 Marital status: Single    Spouse name: Not on file  . Number of children: Not on file  . Years of education: Not on file  . Highest education level: Not on file  Occupational History  . Not on file  Social Needs  . Financial resource strain: Not on file  . Food insecurity:    Worry: Not on file    Inability: Not on file  . Transportation needs:    Medical: Not on file    Non-medical: Not on file  Tobacco Use  . Smoking status: Never Smoker  . Smokeless tobacco: Never Used  Substance and Sexual Activity  . Alcohol use: Yes    Comment: under safe drinking levels  . Drug use: Yes    Types: Marijuana  . Sexual activity: Yes    Comment: male partner  Lifestyle  . Physical activity:    Days per week: Not on file    Minutes per session: Not on file  . Stress: Not on file  Relationships  . Social connections:    Talks on phone: Not on file    Gets together: Not on  file    Attends religious service: Not on file    Active member of club or organization: Not on file    Attends meetings of clubs or organizations: Not on file    Relationship status: Not on file  . Intimate partner violence:    Fear of current or ex partner: Not on file    Emotionally abused: Not on file    Physically abused: Not on file    Forced sexual activity: Not on file  Other Topics Concern  . 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 No pertinent surgical history.  Family History  Problem Relation Age of Onset  . Alcohol abuse Father   . Heart disease Maternal Grandmother   . Cancer Paternal Grandmother        breast    Allergies  Allergen Reactions  . Bee Venom Swelling    Current Outpatient Medications on File Prior to Visit  Medication Sig Dispense  Refill  . cyclobenzaprine (FLEXERIL) 10 MG tablet Nightly as needed for muscle spasm 20 tablet 0   No current facility-administered medications on file prior to visit.     BP 118/70   Temp 98.9 F (37.2 C) (Oral)   Wt 194 lb (88 kg)   BMI 27.84 kg/m       Objective:   Physical Exam  Constitutional: He is oriented to person, place, and time. He appears well-developed and well-nourished. No distress.  Cardiovascular: Normal rate, regular rhythm, normal heart sounds and intact distal pulses. Exam reveals no gallop and no friction rub.  No murmur heard. Pulmonary/Chest: Effort normal and breath sounds normal. No stridor. No respiratory distress. He has no wheezes. He has no rales. He exhibits no tenderness.  Abdominal: Soft. Bowel sounds are normal. He exhibits no distension and no mass. There is no tenderness. There is no rebound and no guarding. No hernia.  Musculoskeletal: Normal range of motion. He exhibits no edema or deformity.  Has slight discomfort with palpation to entire lower  lumbar area.  No bruising or signs of trauma noted  Neurological: He is alert and oriented to person, place, and time.  Skin: Skin is warm and dry. Capillary refill takes less than 2 seconds. He is not diaphoretic.  Psychiatric: He has a normal mood and affect. His behavior is normal. Judgment and thought content normal.  Nursing note and vitals reviewed.     Assessment & Plan:  1. Abnormal urine odor  - POCT Urinalysis Dipstick (Automated)-negative except for specific gravity greater than 1.030.  Likely from multivitamin and or dehydration.  Advised he can increase his water intake and/or stop taking a multivitamin.  2. Chronic bilateral low back pain without sciatica -Not concerned for kidney issue.  We discussed getting CT of abdomen and pelvis, ultimately deciding that this was not needed at this time.  I advised multiple other modalities including, changing insoles of shoes, massage therapy, or acupuncture. -Follow-up as needed  Shirline Frees, NP

## 2018-02-05 NOTE — Telephone Encounter (Signed)
Pt was in the office today and would like to transfer his care from Dr. Selena Batten to Cory,NP would the transfer be okay?

## 2018-02-05 NOTE — Telephone Encounter (Signed)
Already approved this yesterday.

## 2018-02-07 ENCOUNTER — Ambulatory Visit: Payer: BC Managed Care – PPO | Admitting: Family Medicine

## 2018-02-11 NOTE — Procedures (Signed)
Lumbar Facet Joint Intra-Articular Injection(s) with Fluoroscopic Guidance  Patient: Marcus Bullock      Date of Birth: [birth year] MRN: [id number] PCP: Shirline Frees, NP      Visit Date: 01/28/2018   Universal Protocol:    Date/Time: 01/28/2018  Consent Given By: the patient  Position: PRONE   Additional Comments: Vital signs were monitored before and after the procedure. Patient was prepped and draped in the usual sterile fashion. The correct patient, procedure, and site was verified.   Injection Procedure Details:  Procedure Site One Meds Administered:  Meds ordered this encounter  Medications  . methylPREDNISolone acetate (DEPO-MEDROL) injection 80 mg     Laterality: Bilateral  Location/Site:  L4-L5  Needle size: 22 guage  Needle type: Spinal  Needle Placement: Articular  Findings:  -Comments: Excellent flow of contrast producing a partial arthrogram.  Procedure Details: The fluoroscope beam is vertically oriented in AP, and the inferior recess is visualized beneath the lower pole of the inferior apophyseal process, which represents the target point for needle insertion. When direct visualization is difficult the target point is located at the medial projection of the vertebral pedicle. The region overlying each aforementioned target is locally anesthetized with a 1 to 2 ml. volume of 1% Lidocaine without Epinephrine.   The spinal needle was inserted into each of the above mentioned facet joints using biplanar fluoroscopic guidance. A 0.25 to 0.5 ml. volume of Isovue-250 was injected and a partial facet joint arthrogram was obtained. A single spot film was obtained of the resulting arthrogram.    One to 1.25 ml of the steroid/anesthetic solution was then injected into each of the facet joints noted above.   Additional Comments:  The patient tolerated the procedure well Dressing: Band-Aid    Post-procedure details: Patient was observed during the  procedure. Post-procedure instructions were reviewed.  Patient left the clinic in stable condition.

## 2018-02-15 ENCOUNTER — Encounter: Payer: Self-pay | Admitting: Sports Medicine

## 2018-02-15 ENCOUNTER — Ambulatory Visit: Payer: BC Managed Care – PPO | Admitting: Sports Medicine

## 2018-02-15 NOTE — Telephone Encounter (Signed)
ok 

## 2018-02-18 NOTE — Telephone Encounter (Signed)
lmom for pt about below msg.

## 2018-03-19 ENCOUNTER — Ambulatory Visit: Payer: BC Managed Care – PPO | Admitting: Adult Health

## 2018-03-19 ENCOUNTER — Encounter: Payer: Self-pay | Admitting: Adult Health

## 2018-03-19 VITALS — BP 100/76 | Temp 98.4°F | Wt 184.0 lb

## 2018-03-19 DIAGNOSIS — M545 Low back pain, unspecified: Secondary | ICD-10-CM

## 2018-03-19 DIAGNOSIS — Z7689 Persons encountering health services in other specified circumstances: Secondary | ICD-10-CM

## 2018-03-19 MED ORDER — METHYLPREDNISOLONE 4 MG PO TBPK: ORAL_TABLET | ORAL | 0 refills | Status: DC

## 2018-03-19 NOTE — Progress Notes (Signed)
Patient presents to clinic today to establish care. She is a pleasant 32 year old male who  has a past medical history of Left ankle sprain and Skin irritation.   Acute Concerns: Establish Care   Chronic Issues: Low Back pain - has been a chronic issue for the last 10 months. He has had MRI and Xray of his lumbar spine which were normal. He recently tried insoles and reports some relief with this. He has done PT in the past and has steroid injections that did not work. He also recently had two massages and noted relief for a short time form this. He does endorse having to sleep on an old twin mattress at his parents house for the last few months. He has since moved into his own apartment and bought a brand new bed. He has been sleeping better over the last few nights.   Health Maintenance: Dental -- Routine Care - [facility] -- Never seen  Immunizations -- UTD    Past Medical History:  Diagnosis Date  . Left ankle sprain   . 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  Current Outpatient Medications on File Prior to Visit  Medication Sig Dispense Refill  . cyclobenzaprine (FLEXERIL) 10 MG tablet Nightly as needed for muscle spasm 20 tablet 0   No current facility-administered medications on file prior to visit.     Allergies  Allergen Reactions  . Bee Venom Swelling    Family History  Problem Relation Age of Onset  . Alcohol abuse Father   . Heart disease Maternal Grandmother   . Cancer Paternal Grandmother        breast    Social History   Socioeconomic History  . Marital status: Single    Spouse name: Not on file  . Number of children: Not on file  . Years of education: Not on file  . Highest education level: Not on file  Occupational History  . Not on file  Social Needs  . Financial resource strain: Not on file  . Food insecurity:    Worry: Not on file    Inability: Not on  file  . Transportation needs:    Medical: Not on file    Non-medical: Not on file  Tobacco Use  . Smoking status: Never Smoker  . Smokeless tobacco: Never Used  Substance and Sexual Activity  . Alcohol use: Yes    Comment: under safe drinking levels  . Drug use: Yes    Types: Marijuana  . Sexual activity: Yes    Comment: male partner  Lifestyle  . Physical activity:    Days per week: Not on file    Minutes per session: Not on file  . Stress: Not on file  Relationships  . Social connections:    Talks on phone: Not on file    Gets together: Not on file    Attends religious service: Not on file    Active member of club or organization: Not on file    Attends meetings of clubs or organizations: Not on file    Relationship status: Not on file  . Intimate partner violence:    Fear of current or ex partner: Not on file    Emotionally abused: Not on file    Physically abused: Not on file    Forced sexual activity: Not on file  Other Topics Concern  .  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   HENT: Negative.   Eyes: Negative.   Respiratory: Negative.   Cardiovascular: Negative.   Gastrointestinal: Negative.   Genitourinary: Negative.   Musculoskeletal: Positive for myalgias.  Skin: Negative.   Neurological: Negative.   Endo/Heme/Allergies: Negative.   Psychiatric/Behavioral: Negative.   All other systems reviewed and are negative.   There were no vitals taken for this visit.  Physical Exam  Constitutional: He is oriented to person, place, and time. He appears well-developed and well-nourished. No distress.  Cardiovascular: Normal rate, regular rhythm, normal heart sounds and intact distal pulses. Exam reveals no gallop and no friction rub.  No murmur heard. Pulmonary/Chest: Effort normal and breath sounds normal. No  stridor. No respiratory distress. He has no wheezes. He has no rales. He exhibits no tenderness.  Musculoskeletal: Normal range of motion. He exhibits no edema, tenderness or deformity.  Neurological: He is oriented to person, place, and time. He displays normal reflexes. No cranial nerve deficit or sensory deficit. He exhibits normal muscle tone. Coordination normal.  Skin: Skin is warm and dry. Capillary refill takes less than 2 seconds. He is not diaphoretic.  Psychiatric: He has a normal mood and affect. His behavior is normal. Judgment and thought content normal.  Nursing note and vitals reviewed.   Recent Results (from the past 2160 hour(s))  Sed Rate (ESR)     Status: None   Collection Time: 01/07/18  4:44 PM  Result Value Ref Range   Sed Rate 2 0 - 15 mm/h  CRP High sensitivity     Status: None   Collection Time: 01/07/18  4:44 PM  Result Value Ref Range   hs-CRP 0.4 mg/L    Comment: Reference Range Optimal <1.0 Jellinger PS et al. [facility] Pract.2017;23(Suppl 2):1-87. . For ages >63 Years: hs-CRP mg/L  Risk According to AHA/CDC Guidelines <1.0         Lower relative cardiovascular risk. 1.0-3.0      Average relative cardiovascular risk. 3.1-10.0     Higher relative cardiovascular risk.              Consider retesting in 1 to 2 weeks to              exclude a benign transient elevation              in the baseline CRP value secondary              to infection or inflammation. >10.0        Persistent elevation, upon retesting,              may be associated with infection and              inflammation. [name]   Antinuclear Antib (ANA)     Status: None   Collection Time: 01/07/18  4:44 PM  Result Value Ref Range   Anti Nuclear Antibody(ANA) NEGATIVE NEGATIVE    Comment: ANA IFA is a first line screen for detecting the presence of up to approximately 150 autoantibodies in various autoimmune diseases. A negative ANA IFA result suggests ANA-associated autoimmune diseases are  not present at this time. . Visit Physician FAQs for interpretation of all antibodies in the [facility], prevalence, and association with  diseases at http://education.[facility].com/ JHE/RDE081 .   Cyclic citrul peptide antibody, IgG     Status: None   Collection Time: 01/07/18  4:44 PM  Result Value Ref Range   Cyclic Citrullin Peptide Ab <16 UNITS    Comment: Reference Range Negative:            <20 Weak Positive:       20-39 Moderate Positive:   40-59 Strong Positive:     >59 .   HLA B*5701     Status: None   Collection Time: 01/07/18  4:44 PM  Result Value Ref Range   HLA-B*5701 w/rflx HLA-B High Negative     Comment: . The allele HLA-B*5701 is associated with Abacavir hypersensitivity reaction (HSR). A negative result for HLA-B*5701 does not rule out the possibility of Abacavir HSR. Genetic counseling as needed. [name] RESULTS REVIEWED BY:              Ileene Hutchinson, Ph.D.,FACMG Senior Director, Molecular Genetics . References: Mallal S, et al. Elmore Guise. 2002 4:481(8563): [place] 2008 358(6): [phone] . Typing performed by using AS-PCR with reflex to the FDA-cleared LABType(R) SSO Kit. The AS-PCR portion of this test was developed and its analytical performance characteristics have been determined by [place], [place].  It has not been cleared or approved by the U.S. Food and Drug Administration.  This assay has been validated pursuant to the CLIA regulations and is used for clinical purposes. .   Urine cytology ancillary only     Status: None   Collection Time: 01/22/18 12:00 AM  Result Value Ref Range   Chlamydia Negative     Comment: Normal Reference Range - Negative   Neisseria gonorrhea Negative     Comment: Normal Reference Range - Negative   Trichomonas Negative     Comment: Normal Reference Range - Negative  Hemoglobin A1c     Status: Abnormal   Collection Time: 01/22/18  2:00 PM   Result Value Ref Range   Hgb A1c MFr Bld 4.5 (L) 4.6 - 6.5 %    Comment: Glycemic Control Guidelines for People with Diabetes:Non Diabetic:  <6%Goal of Therapy: <7%Additional Action Suggested:  >8%   Lipid panel     Status: None   Collection Time: 01/22/18  2:00 PM  Result Value Ref Range   Cholesterol 145 0 - 200 mg/dL    Comment: ATP III Classification       Desirable:  < 200 mg/dL               Borderline High:  200 - 239 mg/dL          High:  > = 240 mg/dL   Triglycerides 58.0 0.0 - 149.0 mg/dL    Comment: Normal:  <150 mg/dLBorderline High:  150 - 199 mg/dL   HDL 61.60 >39.00 mg/dL   VLDL 11.6 0.0 - 40.0 mg/dL   LDL Cholesterol 72 0 - 99 mg/dL   Total CHOL/HDL Ratio 2     Comment:                Men          Women1/2 Average Risk     3.4          3.3Average Risk          5.0          4.42X Average Risk          9.6  7.13X Average Risk          15.0          11.0                       NonHDL 83.10     Comment: NOTE:  Non-HDL goal should be 30 mg/dL higher than patient's LDL goal (i.e. LDL goal of < 70 mg/dL, would have non-HDL goal of < 100 mg/dL)  HIV antibody     Status: None   Collection Time: 01/22/18  2:00 PM  Result Value Ref Range   HIV 1&2 Ab, 4th Generation NON-REACTIVE NON-REACTI    Comment: HIV-1 antigen and HIV-1/HIV-2 antibodies were not detected. There is no laboratory evidence of HIV infection. [name] PLEASE NOTE: This information has been disclosed to you from records whose confidentiality may be protected by state law.  If your state requires such protection, then the state law prohibits you from making any further disclosure of the information without the specific written consent of the person to whom it pertains, or as otherwise permitted by law. A general authorization for the release of medical or other information is NOT sufficient for this purpose. . For additional information please refer to http://education.[facility].com/faq/FAQ106 (This  link is being provided for informational/ educational purposes only.) . [name] The performance of this assay has not been clinically validated in patients less than 41 years old. .   RPR     Status: None   Collection Time: 01/22/18  2:00 PM  Result Value Ref Range   RPR Ser Ql NON-REACTIVE NON-REACTI  POCT Urinalysis Dipstick (Automated)     Status: Abnormal   Collection Time: 02/05/18  4:55 PM  Result Value Ref Range   Color, UA yellow    Clarity, UA clear    Glucose, UA N    Bilirubin, UA N    Ketones, UA N    Spec Grav, UA >=1.030 (A) 1.010 - 1.025   Blood, UA N    pH, UA 6.0 5.0 - 8.0   Protein, UA N    Urobilinogen, UA 0.2 0.2 or 1.0 E.U./dL   Nitrite, UA N    Leukocytes, UA Negative Negative    Assessment/Plan: 1. Encounter to establish care - Follow up for CPE  - Follow up sooner for an acute isses  2. Acute bilateral low back pain without sciatica - Appears to be muscular in nature  - Ambulatory referral to Physical Therapy- dry needling  - methylPREDNISolone (MEDROL DOSEPAK) 4 MG TBPK tablet; Take as directed  Dispense: 21 tablet; Refill: 0 - Follow up as needed  [name], NP

## 2018-03-25 ENCOUNTER — Encounter: Payer: Self-pay | Admitting: Physical Therapy

## 2018-03-25 ENCOUNTER — Ambulatory Visit (INDEPENDENT_AMBULATORY_CARE_PROVIDER_SITE_OTHER): Payer: BC Managed Care – PPO | Admitting: Physical Therapy

## 2018-03-25 DIAGNOSIS — G8929 Other chronic pain: Secondary | ICD-10-CM

## 2018-03-25 DIAGNOSIS — M62830 Muscle spasm of back: Secondary | ICD-10-CM | POA: Diagnosis not present

## 2018-03-25 DIAGNOSIS — M545 Low back pain: Secondary | ICD-10-CM

## 2018-03-25 NOTE — Patient Instructions (Signed)
Access Code: [email or web address]  URL: [email or web address]  Date: 03/25/2018  Prepared by: Sedalia MutaLauren Belmira Daley   Exercises  Pelvic Tilt - 10 reps - 2 sets - 2x daily  Standing Sidebends - 10 reps - 2 sets - 5 hold - 2x daily  Child's Pose with Sidebending - 3 reps - 2 sets - 30 hold - 2x daily  Standing Forward Trunk Flexion - 3 reps - 30 hold - 2x daily  Supine Lower Trunk Rotation - 10 reps - 2 sets - 2x daily

## 2018-03-26 NOTE — Therapy (Signed)
[facility] [address] [place], [place], [address] Phone: [phone]   Fax:  [phone]  Physical Therapy Evaluation  Patient Details  Name: Marcus Bullock MRN: [id number] Date of Birth: 07-15-1986 Referring Provider: Cordella Register   Encounter Date: 03/25/2018  PT End of Session - 03/25/18 1429    Visit Number  1    Number of Visits  12    Date for PT Re-Evaluation  05/06/18    PT Start Time  1350    PT Stop Time  1429    PT Time Calculation (min)  39 min    Activity Tolerance  Patient tolerated treatment well    Behavior During Therapy  [facility] for tasks assessed/performed       Past Medical History:  Diagnosis Date  . Left ankle sprain   . 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 No pertinent surgical history.  There were no vitals filed for this visit.   Subjective Assessment - 03/25/18 1356    Subjective  Pt states increased pain for about 1 year. Imaging has been negative. Pt works as a Runner, broadcasting/film/video. He feels that he has "tried everyting" but nothing is helping. He was seen by PT(only for 2 visits) and did not return because it "wasnt working". He has some concern that his pain is "coming from somewhere else" due to location of pain.  He used to exercise, lift weights and boxing, but has not done anything in about a year and would like to get back to exercising.     Limitations  Sitting;Standing;Lifting;House hold activities;Walking    Patient Stated Goals  Less pain    Currently in Pain?  Yes    Pain Score  8     Pain Location  Back    Pain Orientation  Right;Left    Pain Descriptors / Indicators  Aching;Tightness    Pain Type  Acute pain    Pain Onset  More than a month ago    Pain Frequency  Constant    Aggravating Factors   Sleeping; standing, walking,     Pain Relieving Factors  Stretching. sitting;          [facility] PT Assessment - 03/26/18 0001      Assessment   Medical Diagnosis  Low Back Pain    Referring Provider  Cordella Register    Prior Therapy  yes(2 visits)      Precautions   Precautions  None      Balance Screen   Has the patient fallen in the past 6 months  No      Prior Function   Level of Independence  Independent      Cognition   Overall Cognitive Status  Within Functional Limits for tasks assessed      AROM   Overall AROM Comments  Lumbar flexion: moderate/significant limitation; Extension: mild limitation: SB ;mild limitatoin;       Strength   Overall Strength Comments  Hips: 4/5; Core: mild/moderate loss of strength/stability, mild/moderate postural changes seen with stabilization/bridge exercise.       Palpation   Palpation comment  Painful deep palpation of QL bilaterally; tightness in bil paraspinals; Tight hamstrings bil; Tight hip flexors Bil;       Special Tests   Other special tests  Negative kidney tap test; Negative radicular pain/ SLR;  Objective measurements completed on examination: See above findings.              PT Education - 03/25/18 1429    Education provided  Yes    Education Details  HEP, PT POC.     Person(s) Educated  Patient    Methods  Explanation    Comprehension  Verbalized understanding       PT Short Term Goals - 03/26/18 1153      PT SHORT TERM GOAL #1   Title  independent with initial HEP    Time  2    Period  Weeks    Status  New    Target Date  04/09/18        PT Long Term Goals - 03/26/18 1153      PT LONG TERM GOAL #1   Title  Pt to report decreased pain in low back to 0-2/10 with rest and activity, to improve ability for sleeping and work duties.     Time  6    Period  Weeks    Status  New    Target Date  05/07/18      PT LONG TERM GOAL #2   Title  Pt to demo improved tenderness to palpate Bil low back/QL region    Time  6    Period  Weeks    Status  New    Target Date  05/07/18      PT LONG TERM GOAL #3    Title  Pt to demo ability for proper lift/squat technique to improve safety with back.     Time  6    Status  New    Target Date  05/07/18      PT LONG TERM GOAL #4   Title  Pt to be independent with long term HEP and have confidence to return to gym equipment.     Time  6    Period  Weeks    Status  New    Target Date  05/07/18             Plan - 03/26/18 1155    Clinical Impression Statement  Pt with primary complaint of increased pain in bil low back region, that radiates laterally. He has increased muscle tightness in Bil QL, with tenderness to same area with deep palpation. He has significant tightness and limitation for lumbar flexion. Pt with lack of effective HEP, and will benefit from education on this. He has decreased strength in core and hips as well. Pt with decreased ability for full functional activites, exercise, and work duties, due to ongoing pain. Pt encouraged to attend PT consistently, to increase chances for pain relief. Pt to benefit from skilled PT to improve deficits , return to exercise, and reduce pain.     Clinical Presentation  Stable    Clinical Decision Making  Low    Rehab Potential  Good    PT Frequency  2x / week    PT Duration  6 weeks    PT Treatment/Interventions  ADLs/Self Care Home Management;Cryotherapy;Electrical Stimulation;Moist Heat;Traction;Patient/family education;Therapeutic exercise;Therapeutic activities;Manual techniques;Iontophoresis 4mg /ml Dexamethasone;Functional mobility training;Stair training;Gait training;Ultrasound;Neuromuscular re-education;Dry needling;Passive range of motion;Taping    Consulted and Agree with Plan of Care  Patient       Patient will benefit from skilled therapeutic intervention in order to improve the following deficits and impairments:  Decreased endurance, Hypomobility, Decreased activity tolerance, Decreased strength, Pain, Increased muscle spasms, Decreased mobility, Decreased range of motion, Impaired  perceived functional ability, Improper body mechanics, Impaired flexibility  Visit Diagnosis: Chronic bilateral low back pain without sciatica  Muscle spasm of back     Problem List Patient Active Problem List   Diagnosis Date Noted  . Breast skin changes 01/18/2017  . Seasonal allergic rhinitis 01/18/2017  . Acute low back pain 01/18/2017    Sedalia MutaLauren Nioka Thorington, PT, DPT 12:10 PM  03/26/18    [facility] [address] [address] [place], [place], [address] Phone: [phone]   Fax:  [phone]  Name: Marcus Bullock MRN: [id number] Date of Birth: 02/14/1986

## 2018-03-27 ENCOUNTER — Ambulatory Visit (INDEPENDENT_AMBULATORY_CARE_PROVIDER_SITE_OTHER): Payer: BC Managed Care – PPO | Admitting: Sports Medicine

## 2018-03-27 ENCOUNTER — Encounter: Payer: Self-pay | Admitting: Sports Medicine

## 2018-03-27 ENCOUNTER — Ambulatory Visit: Payer: BC Managed Care – PPO | Admitting: Physical Therapy

## 2018-03-27 ENCOUNTER — Encounter: Payer: Self-pay | Admitting: Physical Therapy

## 2018-03-27 VITALS — BP 120/80 | HR 81 | Ht 70.0 in | Wt 187.2 lb

## 2018-03-27 DIAGNOSIS — G8929 Other chronic pain: Secondary | ICD-10-CM | POA: Diagnosis not present

## 2018-03-27 DIAGNOSIS — M24559 Contracture, unspecified hip: Secondary | ICD-10-CM

## 2018-03-27 DIAGNOSIS — M9905 Segmental and somatic dysfunction of pelvic region: Secondary | ICD-10-CM

## 2018-03-27 DIAGNOSIS — M9903 Segmental and somatic dysfunction of lumbar region: Secondary | ICD-10-CM

## 2018-03-27 DIAGNOSIS — M9901 Segmental and somatic dysfunction of cervical region: Secondary | ICD-10-CM | POA: Diagnosis not present

## 2018-03-27 DIAGNOSIS — M545 Low back pain: Secondary | ICD-10-CM | POA: Diagnosis not present

## 2018-03-27 DIAGNOSIS — M9902 Segmental and somatic dysfunction of thoracic region: Secondary | ICD-10-CM | POA: Diagnosis not present

## 2018-03-27 DIAGNOSIS — M62830 Muscle spasm of back: Secondary | ICD-10-CM

## 2018-03-27 DIAGNOSIS — M9908 Segmental and somatic dysfunction of rib cage: Secondary | ICD-10-CM

## 2018-03-27 DIAGNOSIS — R29898 Other symptoms and signs involving the musculoskeletal system: Secondary | ICD-10-CM

## 2018-03-27 NOTE — Therapy (Addendum)
[facility] [address] [place], [place], [address] Phone: [phone]   Fax:  [phone]  Physical Therapy Treatment  Patient Details  Name: Marcus Bullock MRN: [id number] Date of Birth: Jul 19, 1986 Referring Provider: [name]   Encounter Date: 03/27/2018  PT End of Session - 03/27/18 1327    Visit Number  2    Number of Visits  12    Date for PT Re-Evaluation  05/06/18    PT Start Time  1245    PT Stop Time  1310    PT Time Calculation (min)  25 min    Activity Tolerance  Patient tolerated treatment well    Behavior During Therapy  [facility] for tasks assessed/performed       Past Medical History:  Diagnosis Date  . Left ankle sprain   . 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 No pertinent surgical history.  There were no vitals filed for this visit.  Subjective Assessment - 03/27/18 1241    Subjective  Pt states decreased pain in back today, but increased pain in mid thoracic region on R. He states that he went to the gym last night, did the elliptical for the first time in a while,  at about 8 pm. He had increased pain this am, in front and back of thoracic region, increased when he takes a deep breath.     Currently in Pain?  Yes    Pain Score  7     Pain Location  Thoracic    Pain Orientation  Right    Pain Descriptors / Indicators  Aching    Pain Type  Acute pain    Pain Onset  Yesterday    Pain Frequency  Intermittent                       [facility] Adult PT Treatment/Exercise - 03/27/18 1255      Lumbar Exercises: Stretches   Active Hamstring Stretch  3 reps;30 seconds    Single Knee to Chest Stretch  3 reps;30 seconds    Lower Trunk Rotation  5 reps;10 seconds    Pelvic Tilt  20 reps      Lumbar Exercises: Supine   Other Supine Lumbar Exercises   Standing QL stretch 30 sec x2 each;                PT Short Term Goals -  03/26/18 1153      PT SHORT TERM GOAL #1   Title  independent with initial HEP    Time  2    Period  Weeks    Status  New    Target Date  04/09/18        PT Long Term Goals - 03/26/18 1153      PT LONG TERM GOAL #1   Title  Pt to report decreased pain in low back to 0-2/10 with rest and activity, to improve ability for sleeping and work duties.     Time  6    Period  Weeks    Status  New    Target Date  05/07/18      PT LONG TERM GOAL #2   Title  Pt to demo improved tenderness to palpate Bil low back/QL region    Time  6    Period  Weeks    Status  New    Target  Date  05/07/18      PT LONG TERM GOAL #3   Title  Pt to demo ability for proper lift/squat technique to improve safety with back.     Time  6    Status  New    Target Date  05/07/18      PT LONG TERM GOAL #4   Title  Pt to be independent with long term HEP and have confidence to return to gym equipment.     Time  6    Period  Weeks    Status  New    Target Date  05/07/18            Plan - 03/27/18 1328    Clinical Impression Statement  Pt with symptoms of intercostal muscle strain upon exam today, but will get pt in to see Sports Med DO today for further assessment. Pt able to perform light ther ex today for lumbar spine, without any increase in thoracic symptoms. He denies any shortness of breath or chest pain.  Decreased ther ex and PT session done today, due to pt having Dr. Hilaria Ota. Pt states that he is going out of town tomorrow and next week, so he has to cancel PT appt. Discussed need to do only light movements with HEP and not stretch with R UE if it causes increased pain on R.     Rehab Potential  Good    PT Frequency  2x / week    PT Duration  6 weeks    PT Treatment/Interventions  ADLs/Self Care Home Management;Cryotherapy;Electrical Stimulation;Moist Heat;Traction;Patient/family education;Therapeutic exercise;Therapeutic activities;Manual techniques;Iontophoresis 46m/ml Dexamethasone;Functional  mobility training;Stair training;Gait training;Ultrasound;Neuromuscular re-education;Dry needling;Passive range of motion;Taping    Consulted and Agree with Plan of Care  Patient       Patient will benefit from skilled therapeutic intervention in order to improve the following deficits and impairments:  Decreased endurance, Hypomobility, Decreased activity tolerance, Decreased strength, Pain, Increased muscle spasms, Decreased mobility, Decreased range of motion, Impaired perceived functional ability, Improper body mechanics, Impaired flexibility  Visit Diagnosis: Chronic bilateral low back pain without sciatica  Muscle spasm of back     Problem List Patient Active Problem List   Diagnosis Date Noted  . Breast skin changes 01/18/2017  . Seasonal allergic rhinitis 01/18/2017  . Acute low back pain 01/18/2017   LLyndee Hensen PT, DPT 2:17 PM  03/27/18   [facility] [address]Phone: [phone]  Fax:  [phone] Name: CLANG ZINGGMRN: [id number] of Birth: [birth year]    PHYSICAL THERAPY DISCHARGE SUMMARY Visits from start of care:  2   Plan: Patient agrees to discharge.  Patient goals were not met. Patient is being discharged due to not returning since the last visit.  ?????     LLyndee Hensen PT, DPT 3:09 PM  07/30/18

## 2018-03-27 NOTE — Patient Instructions (Addendum)
Also check out [employer]"Foundation Training" which is a program developed by Dr. Myles LippsEric Goodman.   There are links to a couple of his YouTube Videos below and I would like to see performing one of his videos 5-6 days per week.    A good intro video is: "Independence from Pain 7-minute Video" - [email or web address]   Exercises that focus more on the neck are as below: Dr. Derrill KayGoodman with [employer] [facility] teaching neck and shoulder details Part 1 - [email or web address] Part 2 Dr. Derrill KayGoodman with [facility] quick routine to practice daily - [email or web address]  Do not try to attempt the entire video when first beginning.    Try breaking of each exercise that he goes into shorter segments.  Otherwise if they perform an exercise for 45 seconds, start with 15 seconds and rest and then resume when they begin the new activity.  If you work your way up to being able to do these videos without having to stop, I expect you will see significant improvements in your pain.  If you enjoy his videos and would like to find out more you can look on his website: [email or web address].  He has a workout streaming option as well as a DVD set available for purchase.  Amazon has the best price for his DVDs.     Please perform the exercise program that we have prepared for you and gone over in detail on a daily basis.  In addition to the handout you were provided you can access your program through: [email or web address]   Your unique program code is: [email or web address]

## 2018-03-27 NOTE — Progress Notes (Signed)
PROCEDURE NOTE : OSTEOPATHIC MANIPULATION The decision today to treat with Osteopathic Manipulative Therapy (OMT) was based on physical exam findings. Verbal consent was obtained following a discussion with the patient regarding the of risks, benefits and potential side effects, including an acute pain flare,post manipulation soreness and need for repeat treatments. Additionally, we specifically discussed the minimal risk of  injury to neurovascular structures associated with Cervical manipulation.   Contraindications to OMT reviewed and include: NONE  Manipulation was performed as below: Regions treated: Cervical spine, Ribs, Thoracic spine, Lumbar spine, Pelvis and Sacrum OMT Techniques Used: HVLA, muscle energy and myofascial release  The patient tolerated the treatment well and reported Improved symptoms following treatment today. Patient was given medications, exercises, stretches and lifestyle modifications per AVS and verbally.   OSTEOPATHIC/STRUCTURAL EXAM:   C2 through C4 FRS left C5 FRS right T2 extended side bent right T4 through T6 rotated left L3 FRS right Left on left sacral torsion Right anterior innominate

## 2018-03-27 NOTE — Progress Notes (Signed)
PROCEDURE NOTE: THERAPEUTIC EXERCISES (97110) 15 minutes spent for Therapeutic exercises as below and as referenced in the AVS.  This included exercises focusing on stretching, strengthening, with significant focus on eccentric aspects.   Proper technique shown and discussed handout in great detail with ATC.  All questions were discussed and answered.   Long term goals include an improvement in range of motion, strength, endurance as well as avoiding reinjury. Frequency of visits is one time as determined during today's  office visit. Frequency of exercises to be performed is as per handout.  EXERCISES REVIEWED:  Pelvic recruitment  Goodman Exercises  Hip ABduction strengthening with focus on Glute Medius Recruitment 

## 2018-03-27 NOTE — Progress Notes (Signed)
[name]. [facility] at [facility] [phone]  Marcus Bullock - 32 y.o. male MRN [id number]  Date of birth: 09-04-1986  Visit Date: 03/27/2018  PCP: Shirline Frees, NP   Referred by: Shirline Frees, NP  Scribe(s) for today's visit: Christoper Fabian, LAT, ATC  SUBJECTIVE:  Marcus Bullock is here for New Patient (Initial Visit) (Chest pain) .    His R chest pain symptoms INITIALLY: Began today when he woke up.  He states he went to the gym yesterday and did the elliptical and the bike. Described as moderate-severe sharp pain, radiating to R t-spine.  States that he feels like the pain shoots through his chest. Worsened with deep breathing, laughing, L side bending and trunk rotation Improved with rest Additional associated symptoms include: no change in pulse/heart rate noted and no N/T; no R UE pain    At this time symptoms show no change compared to onset  He has not tried anything for the pain at this point.   REVIEW OF SYSTEMS: Denies night time disturbances. Denies fevers, chills, or night sweats. Denies unexplained weight loss. Denies personal history of cancer. Denies changes in bowel or bladder habits. Denies recent unreported falls. Denies new or worsening dyspnea or wheezing. Reports headaches or dizziness.  Denies numbness, tingling or weakness  In the extremities.  Denies dizziness or presyncopal episodes Denies lower extremity edema    HISTORY & PERTINENT PRIOR DATA:  Prior History reviewed and updated per electronic medical record.  Significant/pertinent history, findings, studies include:  reports that he has never smoked. He has never used smokeless tobacco. Recent Labs    01/22/18 1400  HGBA1C 4.5*   No specialty comments available. No problems updated.  OBJECTIVE:  VS:  HT:5\' 10"  (177.8 cm)   WT:187 lb 3.2 oz (84.9 kg)  BMI:26.86    BP:120/80  HR:81bpm  TEMP: ( )  RESP:98 %     PHYSICAL EXAM: Constitutional: WDWN, Non-toxic appearing. Psychiatric: Alert & appropriately interactive.  Not depressed or anxious appearing. Respiratory: No increased work of breathing.  Trachea Midline Eyes: Pupils are equal.  EOM intact without nystagmus.  No scleral icterus  Vascular Exam: warm to touch no edema  upper and lower extremity neuro exam: unremarkable normal strength normal sensation normal reflexes  MSK Exam: Full overhead range of motion of the shoulders.  He has markedly tight hip flexors bilaterally left worse than right.  Hip abductor's are weak with 4 out of 5 strength significant TFL predominant recruitment pattern.  Lower extremity strength is otherwise normal.  Slight thoracic rotation and to the right   ASSESSMENT & PLAN:   1. Chronic bilateral low back pain without sciatica   2. Somatic dysfunction of cervical region   3. Somatic dysfunction of thoracic region   4. Somatic dysfunction of lumbar region   5. Somatic dysfunction of rib cage region   6. Somatic dysfunction of pelvis region   7. Weakness of both hips   8. Hip flexor tightness, unspecified laterality     PLAN: Osteopathic manipulation was performed today based on physical exam findings.  Please see procedure note for further information including Osteopathic Exam findings  Discussed the foundation of treatment for this condition is physical therapy and/or daily (5-6 days/week) therapeutic exercises, focusing on core strengthening, coordination, neuromuscular control/reeducation.  Therapeutic exercises prescribed per procedure note.  Links to [employer] provided today per Patient Instructions.  These exercises were developed by  Myles LippsEric Goodman, DC with a strong emphasis on core neuromuscular reducation and postural realignment through body-weight exercises.  Continue with physical therapy with focus on hip flexor stretching and glute medius recruitment given the overall  anterior chain dominance.  Follow-up: Return in about 1 month (around 04/24/2018).  Will consider repeat osteopathic manipulation       Please see additional documentation for Objective, Assessment and Plan sections. Pertinent additional documentation may be included in corresponding procedure notes, imaging studies, problem based documentation and patient instructions. Please see these sections of the encounter for additional information regarding this visit.  CMA/ATC served as Neurosurgeonscribe during this visit. History, Physical, and Plan performed by medical provider. Documentation and orders reviewed and attested to.      Marcus MewsMichael D Rigby, DO    [place] [facility] Phys[facility]

## 2018-03-28 ENCOUNTER — Encounter: Payer: BC Managed Care – PPO | Admitting: Physical Therapy

## 2018-04-11 ENCOUNTER — Encounter: Payer: BC Managed Care – PPO | Admitting: Physical Therapy

## 2018-04-23 ENCOUNTER — Other Ambulatory Visit: Payer: Self-pay | Admitting: Family Medicine

## 2018-04-23 ENCOUNTER — Other Ambulatory Visit: Payer: Self-pay | Admitting: Adult Health

## 2018-04-26 ENCOUNTER — Ambulatory Visit: Payer: BC Managed Care – PPO | Admitting: Sports Medicine

## 2018-05-21 ENCOUNTER — Ambulatory Visit (INDEPENDENT_AMBULATORY_CARE_PROVIDER_SITE_OTHER): Payer: BC Managed Care – PPO | Admitting: Sports Medicine

## 2018-05-21 ENCOUNTER — Encounter: Payer: Self-pay | Admitting: Sports Medicine

## 2018-05-21 VITALS — BP 120/80 | HR 78 | Ht 70.0 in | Wt 192.2 lb

## 2018-05-21 DIAGNOSIS — M9903 Segmental and somatic dysfunction of lumbar region: Secondary | ICD-10-CM

## 2018-05-21 DIAGNOSIS — M545 Low back pain: Secondary | ICD-10-CM

## 2018-05-21 DIAGNOSIS — M9901 Segmental and somatic dysfunction of cervical region: Secondary | ICD-10-CM

## 2018-05-21 DIAGNOSIS — M9908 Segmental and somatic dysfunction of rib cage: Secondary | ICD-10-CM

## 2018-05-21 DIAGNOSIS — G8929 Other chronic pain: Secondary | ICD-10-CM

## 2018-05-21 DIAGNOSIS — R29898 Other symptoms and signs involving the musculoskeletal system: Secondary | ICD-10-CM | POA: Diagnosis not present

## 2018-05-21 DIAGNOSIS — M9902 Segmental and somatic dysfunction of thoracic region: Secondary | ICD-10-CM

## 2018-05-21 DIAGNOSIS — M9906 Segmental and somatic dysfunction of lower extremity: Secondary | ICD-10-CM

## 2018-05-21 DIAGNOSIS — M9905 Segmental and somatic dysfunction of pelvic region: Secondary | ICD-10-CM

## 2018-05-21 DIAGNOSIS — M24559 Contracture, unspecified hip: Secondary | ICD-10-CM | POA: Diagnosis not present

## 2018-05-21 NOTE — Progress Notes (Signed)
PROCEDURE NOTE : OSTEOPATHIC MANIPULATION The decision today to treat with Osteopathic Manipulative Therapy (OMT) was based on physical exam findings. Verbal consent was obtained following a discussion with the patient regarding the of risks, benefits and potential side effects, including an acute pain flare,post manipulation soreness and need for repeat treatments.     Contraindications to OMT: NONE  Manipulation was performed as below: Regions treated: Cervical spine, Thoracic spine, Ribs, Lumbar spine, Pelvis, Sacrum and Lower extremities OMT Techniques Used: HVLA, muscle energy, myofascial release, articulatory and facilitated positional release  The patient tolerated the treatment well and reported Improved symptoms following treatment today. Patient was given medications, exercises, stretches and lifestyle modifications per AVS and verbally.   OSTEOPATHIC/STRUCTURAL EXAM:   C2 - C4 rotated right, side bent right T2 -6 Neutral, Rotated LEFT, Sidebent RIGHT T8 FRS right (Flexed, Rotated & Sidebent) Rib 10 Right  Posterior L4 FRS right (Flexed, Rotated & Sidebent) Right psoas spasm Right anterior innonimate L on L sacral torsion Left anterior tibia on talus Left posterior fibular head

## 2018-05-21 NOTE — Patient Instructions (Signed)

## 2018-05-21 NOTE — Progress Notes (Signed)
Marcus FellsMichael D. Delorise Shinerigby, DO  [place] [facility] [place] [phone]  Marcus Bullock Negrette - 32 y.o. male MRN [id number]  Date of birth: 11/28/1985  Visit Date: 05/21/2018  PCP: Shirline FreesNafziger, Cory, NP   Referred by: Shirline FreesNafziger, Cory, NP  Scribe(s) for today's visit: Stevenson ClinchBrandy Coleman, CMA  SUBJECTIVE:  Marcus Bullock Mccorkel is here for Follow-up (chest/pec pain)   03/27/2018: His R chest/back pain symptoms INITIALLY: Began today when he woke up.  He states he went to the gym yesterday and did the elliptical and the bike. Described as moderate-severe sharp pain, radiating to R t-spine.  States that he feels like the pain shoots through his chest. Worsened with deep breathing, laughing, L side bending and trunk rotation Improved with rest Additional associated symptoms include: no change in pulse/heart rate noted and no N/T; no R UE pain   At this time symptoms show no change compared to onset  He has not tried anything for the pain at this point.  05/21/2018: Compared to the last office visit, his previously described symptoms are improving.  Current symptoms are mild & are radiating to the L periscapular region, feels like its behind his heart. .  [facility]. He has d/c PT d/t cost but plans to pick it back up sept/oct.   He c/o L hamstring pain x 2-3 weeks. He was dancing and felt like he pulled his hamstring.  Pain has not changes since initial injury.  He denies swelling and redness.  Pain is worse with stretching.  He has iced the area with short term relief.   Pt reports that back pain has improved slightly. He has noticed that the "wrap around pain" has stopped. Pain is now described as soreness.  He did PT for about 2 weeks and didn't notice any change in sx. He has been doing HEP with no trouble. He has been stretching with minimal relief.  He notes that he went to a concert and after standing for about  6-7 hours he started feeling n/t in his glutes radiating into his legs. Sx improved slightly after sitting. This has never happened to him before but he does note occasional numbness in his gluteal region after sitting for too long.  He has received Lidocaine trigger point injections for his back with chiro and got some relief.    REVIEW OF SYSTEMS: Denies night time disturbances. Denies fevers, chills, or night sweats. Denies unexplained weight loss. Denies personal history of cancer. Denies changes in bowel or bladder habits. Denies recent unreported falls. Denies new or worsening dyspnea or wheezing. Denies headaches or dizziness.  Reports numbness, tingling in B LE.  Denies dizziness or presyncopal episodes Denies lower extremity edema    HISTORY & PERTINENT PRIOR DATA:  Significant/pertinent history, findings, studies include:  reports that he has never smoked. He has never used smokeless tobacco. Recent Labs    01/22/18 1400  HGBA1C 4.5*   No specialty comments available. No problems updated.  Otherwise prior history reviewed and updated per electronic medical record.    OBJECTIVE:  VS:  HT:5\' 10"  (177.8 cm)   WT:192 lb 3.2 oz (87.2 kg)  BMI:27.58    BP:120/80  HR:78bpm  TEMP: ( )  RESP:98 %   PHYSICAL EXAM: CONSTITUTIONAL: Well-developed, Well-nourished and In no acute distress Alert & appropriately interactive. and Not depressed or anxious appearing. RESPIRATORY: No increased work of breathing and Trachea Midline EYES: Pupils are  equal., EOM intact without nystagmus. and No scleral icterus.  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 Well aligned, no significant  deformity. . No overlying skin changes. . No focal bony tenderness . But restriction in ankle inversion eversion and dorsiflexion but overall well-maintained. . Ligamentously stable .    BACK Exam: Normal alignment & Contours Skin: No overlying erythema/ecchymosis  MOTOR TESTING: Intact in all LE myotomes and Able to heel and toe walk without difficutly        RIGHT    LEFT Straight leg raise-------------------------: normal, no pain                         normal, no pain       REFLEXES Right Left  DTR - L3/4 -Patellar 2+ 2+  DTR - L5/S1 - Achilles 2+ 2+    PROCEDURES & DATA REVIEWED:  1. Osteopathic manipulation was performed today based on physical exam findings.  Please see procedure note for further information including Osteopathic Exam findings 2. Discussed the foundation of treatment for this condition is physical therapy and/or daily (5-6 days/week) therapeutic exercises, focusing on core strengthening, coordination, neuromuscular control/reeducation.  Therapeutic exercises prescribed per procedure note.  ASSESSMENT   1. Chronic bilateral low back pain without sciatica   2. Weakness of both hips   3. Hip flexor tightness, unspecified laterality   4. Somatic dysfunction of cervical region   5. Somatic dysfunction of thoracic region   6. Somatic dysfunction of lumbar region   7. Somatic dysfunction of rib cage region   8. Somatic dysfunction of pelvis region   9. Somatic dysfunction of lower extremity     PLAN:       . Ongoing hip knee and back issues may be coming from the ankle dysfunction that he had.  He had good improvements with his ankle range of motion and it more neutral position after manipulation today. . Please see procedure section and notes. . Links to [employer] videos provided today per Patient Instructions.  These exercises were developed by Myles LippsEric Goodman, DC with a strong emphasis on core neuromuscular reducation and postural  realignment through body-weight exercises. . If any lack of improvement consider further diagnostic evaluation with Further x-rays and MRI of the lumbar spine if persistent symptoms. No problem-specific Assessment & Plan notes found for this encounter.   Follow-up: Return in about 4 weeks (around 06/18/2018) for consideration of repeat Osteopathic Manipulation.      Please see additional documentation for Objective, Assessment and Plan sections. Pertinent additional documentation may be included in corresponding procedure notes, imaging studies, problem based documentation and patient instructions. Please see these sections of the encounter for additional information regarding this visit.  CMA/ATC served as Neurosurgeonscribe during this visit. History, Physical, and Plan performed by medical provider. Documentation and orders reviewed and attested to.      Andrena MewsMichael D Toria Monte, DO    [place] [facility] Physician

## 2018-05-22 ENCOUNTER — Encounter: Payer: Self-pay | Admitting: Sports Medicine

## 2018-05-24 ENCOUNTER — Encounter: Payer: Self-pay | Admitting: Sports Medicine

## 2018-06-06 ENCOUNTER — Ambulatory Visit: Payer: BC Managed Care – PPO | Admitting: Sports Medicine

## 2018-06-06 DIAGNOSIS — Z0289 Encounter for other administrative examinations: Secondary | ICD-10-CM

## 2018-06-14 ENCOUNTER — Ambulatory Visit: Payer: BC Managed Care – PPO | Admitting: Sports Medicine

## 2018-06-28 ENCOUNTER — Encounter: Payer: Self-pay | Admitting: Sports Medicine

## 2018-06-28 ENCOUNTER — Ambulatory Visit (INDEPENDENT_AMBULATORY_CARE_PROVIDER_SITE_OTHER): Payer: BC Managed Care – PPO | Admitting: Sports Medicine

## 2018-06-28 VITALS — BP 110/70 | HR 84 | Ht 70.0 in | Wt 194.2 lb

## 2018-06-28 DIAGNOSIS — M25872 Other specified joint disorders, left ankle and foot: Secondary | ICD-10-CM

## 2018-06-28 DIAGNOSIS — G8929 Other chronic pain: Secondary | ICD-10-CM | POA: Diagnosis not present

## 2018-06-28 DIAGNOSIS — M24559 Contracture, unspecified hip: Secondary | ICD-10-CM | POA: Diagnosis not present

## 2018-06-28 DIAGNOSIS — R29898 Other symptoms and signs involving the musculoskeletal system: Secondary | ICD-10-CM

## 2018-06-28 DIAGNOSIS — M9901 Segmental and somatic dysfunction of cervical region: Secondary | ICD-10-CM

## 2018-06-28 DIAGNOSIS — M545 Low back pain, unspecified: Secondary | ICD-10-CM

## 2018-06-28 DIAGNOSIS — M9902 Segmental and somatic dysfunction of thoracic region: Secondary | ICD-10-CM

## 2018-06-28 DIAGNOSIS — M9903 Segmental and somatic dysfunction of lumbar region: Secondary | ICD-10-CM

## 2018-06-28 DIAGNOSIS — M9904 Segmental and somatic dysfunction of sacral region: Secondary | ICD-10-CM

## 2018-06-28 DIAGNOSIS — M9908 Segmental and somatic dysfunction of rib cage: Secondary | ICD-10-CM

## 2018-06-28 DIAGNOSIS — M9905 Segmental and somatic dysfunction of pelvic region: Secondary | ICD-10-CM

## 2018-06-28 NOTE — Progress Notes (Signed)
PROCEDURE NOTE : OSTEOPATHIC MANIPULATION The decision today to treat with Osteopathic Manipulative Therapy (OMT) was based on physical exam findings. Verbal consent was obtained following a discussion with the patient regarding the of risks, benefits and potential side effects, including an acute pain flare,post manipulation soreness and need for repeat treatments.     Contraindications to OMT: NONE  Manipulation was performed as below: Regions Treated OMT Techniques Used  Cervical spine Thoracic spine Ribs Lumbar spine Pelvis Sacrum Upper extremities HVLA muscle energy myofascial release soft tissue   The patient tolerated the treatment well and reported Improved symptoms following treatment today. Patient was given medications, exercises, stretches and lifestyle modifications per AVS and verbally.   OSTEOPATHIC/STRUCTURAL EXAM:   C2 - C4 rotated right, side bent right T2 -6 Neutral, Rotated LEFT, Sidebent RIGHT T8 FRS right (Flexed, Rotated & Sidebent) Rib 10 Right  Posterior L4 FRS right (Flexed, Rotated & Sidebent) Right psoas spasm Right anterior innonimate L on L sacral torsion Left anterior tibia on talus Left posterior fibular head

## 2018-06-28 NOTE — Patient Instructions (Signed)
Please perform the exercise program that we have prepared for you and gone over in detail on a daily basis.  In addition to the handout you were provided you can access your program through: [email or web address]   Your unique program code is: [email or web address]

## 2018-06-28 NOTE — Progress Notes (Signed)
Marcus Bullock. [facility] at [facility] [phone]  QAADIR KENT - 32 y.o. male MRN [id number]  Date of birth: [birth year]  Visit Date: 06/28/2018  PCP: Shirline Frees, NP   Referred by: Shirline Frees, NP  Scribe(s) for today's visit: Stevenson Clinch, CMA  SUBJECTIVE:  Marcus Bullock is here for Follow-up (LBP)   HPI  03/27/2018: His R chest/back pain symptoms INITIALLY: Began today when he woke up.  He states he went to the gym yesterday and did the elliptical and the bike. Described as moderate-severe sharp pain, radiating to R t-spine.  States that he feels like the pain shoots through his chest. Worsened with deep breathing, laughing, L side bending and trunk rotation Improved with rest Additional associated symptoms include: no change in pulse/heart rate noted and no N/T; no R UE pain   At this time symptoms show no change compared to onset  He has not tried anything for the pain at this point.  05/21/2018: Pt reports that back pain has improved slightly. He has noticed that the "wrap around pain" has stopped. Pain is now described as soreness.  He did PT for about 2 weeks and didn't notice any change in sx. He has been doing HEP with no trouble. He has been stretching with minimal relief.  He notes that he went to a concert and after standing for about 6-7 hours he started feeling n/t in his glutes radiating into his legs. Sx improved slightly after sitting. This has never happened to him before but he does note occasional numbness in his gluteal region after sitting for too long.  He has received Lidocaine trigger point injections for his back with chiro and got some relief.    06/28/2018: LBP Compared to the last office visit, his previously described symptoms are improving.  Current symptoms are moderate & are non-radiating. Radiating pain into legs has subsided. He does c/o tightness in both hips.  He has  been receiving massage therapy once monthly and seeing a chiropractor once weekly.   L ankle pain: L ankle pain has been present x 2 years. He sprained his ankle and has continued pain.  He reports sharp stabbing pain when putting pressure on the heel. Also worse with eversion.   Pain is moderate on a daily basis but severe with certain movements. Pain is non-radiating.  Pain improves, but doesn't not resolve, with rest.  He reports clicking and popping in the ankle but denies swelling.  He has tried compression sleeve in the past but it seemed to make it worse. He has stopped playing sports.     REVIEW OF SYSTEMS: Denies night time disturbances, this has improved but is still troublesome. Denies fevers, chills, or night sweats. Denies unexplained weight loss. Denies personal history of cancer. Denies changes in bowel or bladder habits. Denies recent unreported falls. Denies new or worsening dyspnea or wheezing. Denies headaches or dizziness.  Denies numbness, tingling in B LE.  Denies dizziness or presyncopal episodes Denies lower extremity edema    HISTORY & PERTINENT PRIOR DATA:  Significant/pertinent history, findings, studies include:  reports that he has never smoked. He has never used smokeless tobacco. Recent Labs    01/22/18 1400  HGBA1C 4.5*   No specialty comments available. No problems updated.  Otherwise prior history reviewed and updated per electronic medical record.    OBJECTIVE:  VS:  HT:    WT:   BMI:  BP:   HR: bpm  TEMP: ( )  RESP:    PHYSICAL EXAM: CONSTITUTIONAL: Well-developed, Well-nourished and In no acute distress Psychiatric: Alert & appropriately interactive. and Not depressed or anxious appearing. RESPIRATORY: No increased work of breathing and Trachea Midline EYES: Pupils are equal., EOM intact without nystagmus. and No scleral icterus.  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 Well aligned, no significant deformity. . No overlying skin changes. . No focal bony tenderness . But restriction in ankle inversion eversion and dorsiflexion but overall well-maintained. . Ligamentously stable .    BACK Exam: Normal alignment & Contours Skin: No overlying erythema/ecchymosis  MOTOR TESTING: Intact in all LE myotomes and Able to heel and toe walk without difficutly        RIGHT    LEFT Straight leg raise-------------------------: normal, no pain                         normal, no pain  REFLEXES Right Left  DTR - L3/4 -Patellar 2+ 2+  DTR - L5/S1 - Achilles 2+ 2+    ASSESSMENT   1. Chronic bilateral low back pain without sciatica   2. Weakness of both hips   3. Hip flexor tightness, unspecified laterality   4. Somatic dysfunction of cervical region   5. Somatic dysfunction of thoracic region   6. Somatic dysfunction of lumbar region   7. Somatic dysfunction of rib cage region   8. Somatic dysfunction of sacral region   9. Somatic dysfunction of pelvis region     PLAN:  Pertinent additional documentation may be included in corresponding procedure notes, imaging studies, problem based documentation and patient instructions.  Procedures:  . Osteopathic manipulation was performed today based on physical exam findings.  Please see procedure note for further information including Osteopathic Exam findings . Discussed the foundation of treatment for this condition is physical therapy and/or daily (5-6 days/week) therapeutic exercises, focusing on core strengthening, coordination, neuromuscular control/reeducation.  Therapeutic exercises prescribed per procedure note.  Medications:  No orders of the defined  types were placed in this encounter.  Discussion/Instructions: No problem-specific Assessment & Plan notes found for this encounter.  . Discussed the underlying features of tight hip flexors leading to crouched, fetal like position that results in spinal column compression.  Including lumbar hyperflexion with hypermobility, thoracic flexion with restrictive rotation and cervical lordosis reversal  . Discussed red flag symptoms that warrant earlier emergent evaluation and patient voices understanding. . Activity modifications and the importance of avoiding exacerbating activities (limiting pain to no more than a 4 / 10 during or following activity) recommended and discussed.  Follow-up:  . Return if symptoms worsen or fail to improve.  . If any lack of improvement consider: further diagnostic evaluation with advanced imaging as needed . At follow up will plan to consider: repeat osteopathic manipulation     CMA/ATC served as scribe during this visit. History, Physical, and Plan performed by medical provider. Documentation and orders reviewed and attested to.      Andrena Mews, DO    [facility]

## 2018-07-08 ENCOUNTER — Ambulatory Visit: Payer: Self-pay | Admitting: Adult Health

## 2018-07-08 NOTE — Telephone Encounter (Signed)
Pt c/o "sharp" or a "shock pain" to the back of his head and towards the right of head. Pt stated he has a history of migraine headaches and pt stated that this is not the pain he is feeling. Pt stated that the pain comes and goes and is mild  (3/10).  Pt has been undergoing chiropractic adjustments to his back.  Pt denies any recent injuries to the head. Pt stated that when he feels the pain it sends "spasms to the the top of my body."  Pt c/o stiff neck but can touch his chin to his chest. Pt c/o sore throat. Pt denies fever, eye pain, or changes in vision, or cold symptoms.   Care instructions given and pt verbalized understanding.  Offered pt a morning appointment, but pt wanted a later appointment and only wanted to see [facility]. Appointment accepted for tomorrow with Shirline Frees NP at 5:45 pm. Reason for Disposition . [1] MILD-MODERATE headache AND [2] present > 72 hours  Answer Assessment - Initial Assessment Questions 1. LOCATION: "Where does it hurt?"      Back and right side side of head 2. ONSET: "When did the headache start?" (Minutes, hours or days)      yesterday 3. PATTERN: "Does the pain come and go, or has it been constant since it started?"     Comes and goes 4. SEVERITY: "How bad is the pain?" and "What does it keep you from doing?"  (e.g., Scale 1-10; mild, moderate, or severe)   - MILD (1-3): doesn't interfere with normal activities    - MODERATE (4-7): interferes with normal activities or awakens from sleep    - SEVERE (8-10): excruciating pain, unable to do any normal activities        3/10 mild 5. RECURRENT SYMPTOM: "Have you ever had headaches before?" If so, ask: "When was the last time?" and "What happened that time?"      No "not like this" 6. CAUSE: "What do you think is causing the headache?"     Doing chiropractic adjustments to the back 7. MIGRAINE: "Have you been diagnosed with migraine headaches?" If so, ask: "Is this headache similar?"      Yes- this is not  similar 8. HEAD INJURY: "Has there been any recent injury to the head?"      no 9. OTHER SYMPTOMS: "Do you have any other symptoms?" (fever, stiff neck, eye pain, sore throat, cold symptoms)     Stiff neck,sore throat 10. PREGNANCY: "Is there any chance you are pregnant?" "When was your last menstrual period?"       n/a  Protocols used: HEADACHE-A-AH

## 2018-07-09 ENCOUNTER — Ambulatory Visit: Payer: BC Managed Care – PPO | Admitting: Adult Health

## 2018-07-09 DIAGNOSIS — Z0289 Encounter for other administrative examinations: Secondary | ICD-10-CM

## 2018-08-11 ENCOUNTER — Encounter: Payer: Self-pay | Admitting: Sports Medicine

## 2018-08-11 DIAGNOSIS — M25879 Other specified joint disorders, unspecified ankle and foot: Secondary | ICD-10-CM | POA: Insufficient documentation

## 2018-08-11 NOTE — Progress Notes (Signed)
PROCEDURE NOTE: THERAPEUTIC EXERCISES (97110) 15 minutes spent for Therapeutic exercises as below and as referenced in the AVS.  This included exercises focusing on stretching, strengthening, with significant focus on eccentric aspects.   Proper technique shown and discussed handout in great detail with ATC.  All questions were discussed and answered.   Long term goals include an improvement in range of motion, strength, endurance as well as avoiding reinjury. Frequency of visits is one time as determined during today's  office visit. Frequency of exercises to be performed is as per handout.  EXERCISES REVIEWED:  4 way ANKLE exercises 

## 2018-08-11 NOTE — Assessment & Plan Note (Signed)
Discussed the foundation of treatment for this condition is physical therapy and/or daily (5-6 days/week) therapeutic exercises, focusing on core strengthening, coordination, neuromuscular control/reeducation.  Therapeutic exercises prescribed per procedure note.  Consider compression

## 2018-09-04 ENCOUNTER — Encounter: Payer: Self-pay | Admitting: *Deleted

## 2018-09-04 ENCOUNTER — Ambulatory Visit: Payer: BC Managed Care – PPO | Admitting: Family Medicine

## 2018-09-04 ENCOUNTER — Ambulatory Visit (INDEPENDENT_AMBULATORY_CARE_PROVIDER_SITE_OTHER): Payer: BC Managed Care – PPO

## 2018-09-04 ENCOUNTER — Encounter: Payer: Self-pay | Admitting: Family Medicine

## 2018-09-04 VITALS — BP 122/76 | HR 100 | Temp 99.4°F | Resp 12 | Ht 70.0 in | Wt 191.2 lb

## 2018-09-04 DIAGNOSIS — R05 Cough: Secondary | ICD-10-CM | POA: Diagnosis not present

## 2018-09-04 DIAGNOSIS — R059 Cough, unspecified: Secondary | ICD-10-CM

## 2018-09-04 DIAGNOSIS — J069 Acute upper respiratory infection, unspecified: Secondary | ICD-10-CM

## 2018-09-04 DIAGNOSIS — M545 Low back pain: Secondary | ICD-10-CM

## 2018-09-04 DIAGNOSIS — G8929 Other chronic pain: Secondary | ICD-10-CM

## 2018-09-04 LAB — POCT INFLUENZA A/B
Influenza A, POC: NEGATIVE
Influenza B, POC: NEGATIVE

## 2018-09-04 MED ORDER — BENZONATATE 100 MG PO CAPS: 200.0000 mg | ORAL_CAPSULE | Freq: Two times a day (BID) | ORAL | 0 refills | Status: AC | PRN

## 2018-09-04 NOTE — Patient Instructions (Addendum)
  [name] I have seen you today for an acute visit.  A few things to remember from today's visit:   URI, acute - Plan: POC Influenza A/B  Cough - Plan: POC Influenza A/B  Chronic bilateral low back pain, unspecified whether sciatica present - Plan: DG Lumbar Spine Complete   If medications prescribed today, they will not be refill upon request, a follow up appointment with PCP will be necessary to discuss continuation of of treatment if appropriate.  viral infections are self-limited and we treat each symptom depending of severity.  Over the counter medications as decongestants and cold medications usually help, they need to be taken with caution if there is a history of high blood pressure or palpitations. Tylenol and/or Ibuprofen also helps with most symptoms (headache, muscle aching, fever,etc) Plenty of fluids. Honey helps with cough. Steam inhalations helps with runny nose, nasal congestion, and may prevent sinus infections. Cough and nasal congestion could last a few days and sometimes weeks.     In general please monitor for signs of worsening symptoms and seek immediate medical attention if any concerning.  If symptoms are not resolved in 1-2 weeks you should schedule a follow up appointment with your doctor, before if needed.  I hope you get better soon!

## 2018-09-04 NOTE — Progress Notes (Signed)
ACUTE VISIT   HPI:  Chief Complaint  Patient presents with  . Back Pain    lower back pain for over 1 year that radiates to buttocks  . Cough    sx started yesterday, causing pain in upper back  . Nasal Congestion    [name] is a 32 y.o. male, who is here today complaining of nonproductive cough that is started yesterday. He denies wheezing, dyspnea, or chest pain.  Subjective fever and chills. Retro-ocular achy headache, denies visual changes, focal deficit, nausea, or vomiting.  Sore throat (3/10), decreased appetite, and myalgias. 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  He did not get a flu shot this year.  He has taking Alka-Seltzer.   He is also requesting a lower back x-ray for a "second opinion." Long history of low weight and upper back pain, lower back pain 6-8/10, sometimes radiated to flanks. It is constant, exacerbated by movement. No alleviating factors identified.  Pain has been mildly worse since yesterday, exacerbated by coughing. He has not noted local edema, erythema, or skin rash. He denies lower extremity numbness or tingling, urine/bowel incontinence, or saddle anesthesia. He states that he is frustrated because test done so far have been otherwise normal.  He has followed with chiropractor, orthopedist, and completed physical therapy but has not noted any improvement.  Lumbar MRI 01/05/18: Normal examination, with the exception of very mild curvature convex to the right. No degenerative changes. No stenosis. No cause of the presenting symptoms is identified.  Review of Systems  Constitutional: Positive for activity change, appetite change, chills, fatigue and fever.  HENT: Positive for congestion, rhinorrhea and sore throat. Negative for ear pain, mouth sores, postnasal drip, sinus pressure, trouble swallowing and voice change.   Eyes: Negative for discharge, redness and visual  disturbance.  Respiratory: Positive for cough. Negative for chest tightness, shortness of breath and wheezing.   Gastrointestinal: Negative for abdominal pain, diarrhea, nausea and vomiting.  Musculoskeletal: Positive for back pain and myalgias. Negative for joint swelling and neck pain.  Skin: Negative for rash.  Allergic/Immunologic: Positive for environmental allergies.  Neurological: Positive for headaches (retro ocular). Negative for weakness.  Hematological: Negative for adenopathy. Does not bruise/bleed easily.    No current outpatient medications on file prior to visit.   No current facility-administered medications on file prior to visit.      Past Medical History:  Diagnosis Date  . Left ankle sprain   . 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 Bee Venom Swelling    Social History   Socioeconomic History  . Marital status: Single    Spouse name: Not on file  . Number of children: Not on file  . Years of education: Not on file  . Highest education level: Not on file  Occupational History  . Not on file  Social Needs  . Financial resource strain: Not on file  . Food insecurity:    Worry: Not on file    Inability: Not on file  . Transportation needs:    Medical: Not on file    Non-medical: Not on file  Tobacco Use  . Smoking status: Never Smoker  . Smokeless tobacco: Never Used  Substance and Sexual Activity  . Alcohol use: Yes    Comment: under safe drinking levels  . Drug  use: Not Currently  . Sexual activity: Yes    Comment: male partner  Lifestyle  . Physical activity:    Days per week: Not on file    Minutes per session: Not on file  . Stress: Not on file  Relationships  . Social connections:    Talks on phone: Not on file    Gets together: Not on file    Attends religious service: Not on file    Active member of club or organization: Not on file    Attends meetings of  clubs or organizations: Not on file    Relationship status: Not on file  Other Topics Concern  . 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       Vitals:   09/04/18 1508  BP: 122/76  Pulse: 100  Resp: 12  Temp: 99.4 F (37.4 C)  SpO2: 96%   Body mass index is 27.44 kg/m.   Physical Exam  Nursing note and vitals reviewed. Constitutional: He is oriented to person, place, and time. He appears well-developed and well-nourished. He does not appear ill. No distress.  HENT:  Head: Normocephalic and atraumatic.  Nose: Right sinus exhibits no maxillary sinus tenderness and no frontal sinus tenderness. Left sinus exhibits no maxillary sinus tenderness and no frontal sinus tenderness.  Mouth/Throat: Uvula is midline and mucous membranes are normal. Posterior oropharyngeal erythema (mild) present. No oropharyngeal exudate or posterior oropharyngeal edema.  Eyes: Pupils are equal, round, and reactive to light. Conjunctivae are normal.  Cardiovascular: Normal rate and regular rhythm.  No murmur heard. Respiratory: Effort normal and breath sounds normal. No stridor. No respiratory distress.  Musculoskeletal:       Lumbar back: He exhibits tenderness. He exhibits no bony tenderness.       Back:  Lymphadenopathy:       Head (right side): No submandibular adenopathy present.       Head (left side): No submandibular adenopathy present.    He has no cervical adenopathy.  Neurological: He is alert and oriented to person, place, and time. He has normal strength. No cranial nerve deficit. Gait normal.  Skin: Skin is warm. No rash noted. No erythema.  Psychiatric: His mood appears anxious.  Well groomed, good eye contact.    ASSESSMENT AND PLAN:   [name] was seen today for back pain, cough and nasal congestion.  Diagnoses and all orders for this visit:  URI, acute Rapid flu here in the office  negative. Symptoms suggests a viral etiology, instructed to monitor for new symptoms given the fact problem started yesterday. Symptomatic treatment recommended at this time. Instructed to monitor for signs of complications,instructed about warning signs. F/U as needed.  -     POC Influenza A/B  Cough Lung auscultation here in the office negative, so I do not think long imaging is needed at this time. Explained that cough can last a few days and even weeks after recovering from a URI.  -     POC Influenza A/B -     benzonatate (TESSALON) 100 MG capsule; Take 2 capsules (200 mg total) by mouth 2 (two) times daily as needed for up to 10 days.  Chronic bilateral low back pain, unspecified whether sciatica present This is a chronic problem. Lumbar x-ray ordered placed as requested. Recommend following with PCP on/or orthopedist if needed.  -  DG Lumbar Spine Complete; Future     Return if symptoms worsen or fail to improve.      Willella Harding G. [place], MD  [facility]. [place] office.

## 2018-09-09 ENCOUNTER — Encounter: Payer: Self-pay | Admitting: Family Medicine

## 2018-09-23 ENCOUNTER — Encounter: Payer: Self-pay | Admitting: Family Medicine

## 2018-09-23 ENCOUNTER — Ambulatory Visit: Payer: BC Managed Care – PPO | Admitting: Family Medicine

## 2018-09-23 VITALS — BP 122/80 | HR 118 | Temp 98.5°F | Wt 192.6 lb

## 2018-09-23 DIAGNOSIS — Z209 Contact with and (suspected) exposure to unspecified communicable disease: Secondary | ICD-10-CM

## 2018-09-23 DIAGNOSIS — N410 Acute prostatitis: Secondary | ICD-10-CM | POA: Diagnosis not present

## 2018-09-23 MED ORDER — DOXYCYCLINE HYCLATE 100 MG PO CAPS: 100.0000 mg | ORAL_CAPSULE | Freq: Two times a day (BID) | ORAL | 0 refills | Status: AC

## 2018-09-23 NOTE — Progress Notes (Signed)
   Subjective:    Patient ID: Fraser DinCarrington E Croak, male    DOB: 04/08/1986, 32 y.o.   MRN: [id number]  HPI Here for 4 days of pain in both testicles when he urinates and some increased frequency. No blood in the urine. No DC. He did have unprotected sex with a new partner about a week ago.    Review of Systems  Constitutional: Negative.   Respiratory: Negative.   Cardiovascular: Negative.   Genitourinary: Positive for flank pain, frequency and testicular pain. Negative for discharge and hematuria.       Objective:   Physical Exam Constitutional:      Appearance: Normal appearance.  Cardiovascular:     Rate and Rhythm: Normal rate and regular rhythm.     Pulses: Normal pulses.     Heart sounds: Normal heart sounds.  Pulmonary:     Effort: Pulmonary effort is normal.     Breath sounds: Normal breath sounds.  Abdominal:     General: Abdomen is flat. Bowel sounds are normal. There is no distension.     Palpations: Abdomen is soft. There is no mass.     Tenderness: There is no abdominal tenderness. There is no guarding or rebound.     Hernia: No hernia is present.  Genitourinary:    Scrotum/Testes: Normal.     Comments: Prostate is swollen and tender  Neurological:     Mental Status: He is alert.           Assessment & Plan:  Prostatitis, treat with 14 days of Doxycycline. Check for STDs.  Gershon CraneStephen Elia Keenum, MD

## 2018-09-26 ENCOUNTER — Encounter: Payer: Self-pay | Admitting: *Deleted

## 2018-09-26 LAB — URINE CULTURE
MICRO NUMBER:: 91538775
RESULT: NO GROWTH
SPECIMEN QUALITY:: ADEQUATE

## 2018-09-26 LAB — HIV ANTIBODY (ROUTINE TESTING W REFLEX): HIV: NONREACTIVE

## 2018-09-26 LAB — C. TRACHOMATIS/N. GONORRHOEAE RNA
C. trachomatis RNA, TMA: NOT DETECTED
N. gonorrhoeae RNA, TMA: NOT DETECTED

## 2018-09-26 LAB — RPR: RPR: NONREACTIVE

## 2018-10-03 ENCOUNTER — Telehealth: Payer: Self-pay | Admitting: Adult Health

## 2018-10-03 NOTE — Telephone Encounter (Signed)
Copied from CRM [id number]. Topic: Quick Communication - See Telephone Encounter >> Oct 03, 2018  1:52 PM Lorrine Kin, NT wrote: CRM for notification. See Telephone encounter for: 10/03/18. Patient calling and states that he is still having back pain and Kandee Keen told him that the next step would be to do a CT scan. Patient would like to go ahead and move forward and have the CT scan done. Please advise.

## 2018-10-04 NOTE — Telephone Encounter (Signed)
Pt has been scheduled to see [facility].  Nothing further needed.

## 2018-10-04 NOTE — Telephone Encounter (Signed)
Since it has been such a long time, please have him come back in for reevaluation

## 2018-10-10 ENCOUNTER — Encounter: Payer: Self-pay | Admitting: Adult Health

## 2018-10-10 ENCOUNTER — Ambulatory Visit (INDEPENDENT_AMBULATORY_CARE_PROVIDER_SITE_OTHER): Payer: BC Managed Care – PPO | Admitting: Adult Health

## 2018-10-10 VITALS — BP 110/72 | Temp 98.3°F | Wt 198.0 lb

## 2018-10-10 DIAGNOSIS — N410 Acute prostatitis: Secondary | ICD-10-CM | POA: Diagnosis not present

## 2018-10-10 DIAGNOSIS — G8929 Other chronic pain: Secondary | ICD-10-CM | POA: Diagnosis not present

## 2018-10-10 DIAGNOSIS — M545 Low back pain: Secondary | ICD-10-CM | POA: Diagnosis not present

## 2018-10-10 MED ORDER — DOXYCYCLINE HYCLATE 100 MG PO CAPS: 100.0000 mg | ORAL_CAPSULE | Freq: Two times a day (BID) | ORAL | 0 refills | Status: AC

## 2018-10-10 NOTE — Progress Notes (Signed)
Subjective:    Patient ID: Marcus Bullock, male    DOB: August 12, 1986, 33 y.o.   MRN: [id number]  HPI 33 year old male who  has a past medical history of Left ankle sprain and Skin irritation. He presents to the office today for follow up on low back pain that has been present for about a year and half at this point . He was last seen in June 2019 for this issue. Today he reports that he continues to have back pain that is not getting any better but has not become any worse.   In the past he has been seen by orthopedics August Saucer and Dr. Alvester Morin), has had an MRI of the lumbar spine as well as an x-ray done which were essentially normal except for small curvature in his lumbar spine.  He is also been seen by sports medicine, had steroid injections, and has participated in massage therapy.  He has noted some mild relief for a very short period of time after, these modalities( stretching and massage) but found no relief and to steroid injections or muscle relaxers.  Has started exercising doing yoga and swimming in the hopes that this would help but has not noticed any relief from this as well.  They in the office he is looking for some additional ideas on how he can resolve his low back pain.  Pain is pretty constant but is worse with standing for long periods of time and bending and twisting.  This generally he was seen by another provider in the office 2 weeks ago diagnosed with prostatitis.  He was treated with 14 days of doxycycline.  Office he reports that feels about 50% improved but continues to have some discomfort underneath his testicles and when he urinates.  Has not noticed any blood in his urine has not had any discharge. He denies fevers or feeling acutely ill   Review of Systems See HPI   Past Medical History:  Diagnosis Date  . Left ankle sprain   . 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 Marital  status: Single    Spouse name: Not on file  . Number of children: Not on file  . Years of education: Not on file  . Highest education level: Not on file  Occupational History  . Not on file  Social Needs  . Financial resource strain: Not on file  . Food insecurity:    Worry: Not on file    Inability: Not on file  . Transportation needs:    Medical: Not on file    Non-medical: Not on file  Tobacco Use  . Smoking status: Never Smoker  . Smokeless tobacco: Never Used  Substance and Sexual Activity  . Alcohol use: Yes    Comment: under safe drinking levels  . Drug use: Not Currently  . Sexual activity: Yes    Comment: male partner  Lifestyle  . Physical activity:    Days per week: Not on file    Minutes per session: Not on file  . Stress: Not on file  Relationships  . Social connections:    Talks on phone: Not on file    Gets together: Not on file    Attends religious service: Not on file    Active member of club or organization: Not on file    Attends meetings of clubs or organizations: Not on  file    Relationship status: Not on file  . Intimate partner violence:    Fear of current or ex partner: Not on file    Emotionally abused: Not on file    Physically abused: Not on file    Forced sexual activity: Not on file  Other Topics Concern  . 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       History reviewed. No pertinent surgical history.  Family History  Problem Relation Age of Onset  . Alcohol abuse Father   . Heart disease Maternal Grandmother   . Cancer Paternal Grandmother        breast  . Thyroid disease Mother     Allergies  Allergen Reactions  . Bee Venom Swelling    No current outpatient medications on file prior to visit.   No current facility-administered medications on file prior to visit.     BP 110/72   Temp 98.3 F (36.8 C)   Wt 198 lb (89.8 kg)    BMI 28.41 kg/m       Objective:   Physical Exam Vitals signs and nursing note reviewed.  Constitutional:      Appearance: Normal appearance.  Cardiovascular:     Rate and Rhythm: Normal rate and regular rhythm.     Pulses: Normal pulses.     Heart sounds: Normal heart sounds.  Genitourinary:    Scrotum/Testes: Normal.     Prostate: Tender.     Rectum: Normal. Guaiac result negative.  Musculoskeletal:        General: Tenderness (Has tenderness with palpation along right sided lower latissimus dorsi and Thoracolumbar Fascia ) present.     Comments: No spinal tenderness   Skin:    General: Skin is warm and dry.  Neurological:     General: No focal deficit present.     Mental Status: He is alert and oriented to person, place, and time. Mental status is at baseline.  Psychiatric:        Mood and Affect: Mood normal.        Behavior: Behavior normal.        Thought Content: Thought content normal.        Judgment: Judgment normal.       Assessment & Plan:  1. Chronic bilateral low back pain without sciatica -Continue to believe that this is more muscular and fascia pain.  He would like a second opinion and referral to orthopedics.  I also advised acupuncture which he is willing to try.  Acupuncture recommendation given - AMB referral to orthopedics - Follow up as needed - Stretch and warm compresses 2. Acute prostatitis - will continue on doxycyline for an additional 21 days  - doxycycline (VIBRAMYCIN) 100 MG capsule; Take 1 capsule (100 mg total) by mouth 2 (two) times daily for 21 days.  Dispense: 42 capsule; Refill: 0  Shirline Freesory Kristelle Cavallaro, NP

## 2019-01-27 ENCOUNTER — Encounter: Payer: Self-pay | Admitting: Family Medicine

## 2019-01-27 ENCOUNTER — Ambulatory Visit: Payer: BC Managed Care – PPO | Admitting: Family Medicine

## 2019-01-27 ENCOUNTER — Other Ambulatory Visit: Payer: Self-pay

## 2019-01-27 ENCOUNTER — Ambulatory Visit (INDEPENDENT_AMBULATORY_CARE_PROVIDER_SITE_OTHER): Payer: BC Managed Care – PPO

## 2019-01-27 VITALS — BP 120/84 | HR 94 | Temp 99.0°F | Wt 199.4 lb

## 2019-01-27 DIAGNOSIS — M546 Pain in thoracic spine: Secondary | ICD-10-CM

## 2019-01-27 NOTE — Progress Notes (Signed)
   Subjective:    Patient ID: Marcus Bullock, male    DOB: 12-31-1985, 33 y.o.   MRN: [id number]  HPI Here for 10 days of a burning pain in the left middle back. No recent trauma. He has had chronic pain in the lower back for 2 years, but never this high up on the back. For the lower back he has tried NSAIDs, Prednisone, muscle relaxers, PT, acupuncture, and chiropractic manipulations, all with mixed success. Currently he uses Ibuprofen, a muscle relaxer, and Lidocaine patches. He thought this most recent pain was due to heartburn, so he has been taking Prilosec for the past 3 days. This has not helped at all. No fever or cough or SOB. No NVD.    Review of Systems  Constitutional: Negative.   Respiratory: Negative.   Cardiovascular: Negative.   Gastrointestinal: Negative.   Musculoskeletal: Positive for back pain.       Objective:   Physical Exam Constitutional:      General: He is not in acute distress.    Appearance: Normal appearance.  Cardiovascular:     Rate and Rhythm: Normal rate and regular rhythm.     Pulses: Normal pulses.     Heart sounds: Normal heart sounds.  Pulmonary:     Effort: Pulmonary effort is normal. No respiratory distress.     Breath sounds: Normal breath sounds. No stridor. No wheezing, rhonchi or rales.  Chest:     Chest wall: No tenderness.  Abdominal:     General: Abdomen is flat. Bowel sounds are normal. There is no distension.     Palpations: Abdomen is soft. There is no mass.     Tenderness: There is no abdominal tenderness. There is no guarding or rebound.     Hernia: No hernia is present.  Musculoskeletal:     Comments: He is tender over the lower thoracic spine and to the left of the spine beneath the left scapula. ROM is full. Xrays reveal some lateral scoliosis but no acute changes   Skin:    Findings: No rash.  Neurological:     Mental Status: He is alert.           Assessment & Plan:  Thoracic back pain, this is a muscular  pain and he can treat it in a similar fashion as the lower back pain. He can stop taking Prilosec. I suggested he try acupuncture again, and he agreed.  Gershon Crane, MD

## 2019-01-28 ENCOUNTER — Emergency Department (HOSPITAL_COMMUNITY): Payer: BC Managed Care – PPO

## 2019-01-28 ENCOUNTER — Encounter: Payer: Self-pay | Admitting: *Deleted

## 2019-01-28 ENCOUNTER — Emergency Department (HOSPITAL_COMMUNITY)
Admission: EM | Admit: 2019-01-28 | Discharge: 2019-01-28 | Disposition: A | Discharge status: Home / Self Care | Payer: BC Managed Care – PPO | Attending: Emergency Medicine | Admitting: Emergency Medicine

## 2019-01-28 ENCOUNTER — Ambulatory Visit: Payer: BC Managed Care – PPO | Admitting: Adult Health

## 2019-01-28 ENCOUNTER — Other Ambulatory Visit: Payer: Self-pay

## 2019-01-28 DIAGNOSIS — M549 Dorsalgia, unspecified: Secondary | ICD-10-CM | POA: Diagnosis not present

## 2019-01-28 DIAGNOSIS — R0989 Other specified symptoms and signs involving the circulatory and respiratory systems: Secondary | ICD-10-CM | POA: Insufficient documentation

## 2019-01-28 LAB — CBC WITH DIFFERENTIAL/PLATELET
Abs Immature Granulocytes: 0.04 10*3/uL (ref 0.00–0.07)
Basophils Absolute: 0.1 10*3/uL (ref 0.0–0.1)
Basophils Relative: 1 %
Eosinophils Absolute: 0.1 10*3/uL (ref 0.0–0.5)
Eosinophils Relative: 1 %
HCT: 43.5 % (ref 39.0–52.0)
Hemoglobin: 13.9 g/dL (ref 13.0–17.0)
Immature Granulocytes: 1 %
Lymphocytes Relative: 29 %
Lymphs Abs: 2.3 10*3/uL (ref 0.7–4.0)
MCH: 29.4 pg (ref 26.0–34.0)
MCHC: 32 g/dL (ref 30.0–36.0)
MCV: 92 fL (ref 80.0–100.0)
Monocytes Absolute: 1 10*3/uL (ref 0.1–1.0)
Monocytes Relative: 13 %
Neutro Abs: 4.3 10*3/uL (ref 1.7–7.7)
Neutrophils Relative %: 55 %
Platelets: 233 10*3/uL (ref 150–400)
RBC: 4.73 MIL/uL (ref 4.22–5.81)
RDW: 11.8 % (ref 11.5–15.5)
WBC: 7.8 10*3/uL (ref 4.0–10.5)
nRBC: 0 % (ref 0.0–0.2)

## 2019-01-28 LAB — BASIC METABOLIC PANEL
Anion gap: 6 (ref 5–15)
BUN: 10 mg/dL (ref 6–20)
CO2: 26 mmol/L (ref 22–32)
Calcium: 8.8 mg/dL — ABNORMAL LOW (ref 8.9–10.3)
Chloride: 106 mmol/L (ref 98–111)
Creatinine, Ser: 1.05 mg/dL (ref 0.61–1.24)
GFR calc Af Amer: >60 mL/min (ref >60)
GFR calc non Af Amer: >60 mL/min (ref >60)
Glucose, Bld: 98 mg/dL (ref 70–99)
Potassium: 3.6 mmol/L (ref 3.5–5.1)
Sodium: 138 mmol/L (ref 135–145)

## 2019-01-28 LAB — TROPONIN I: Troponin I: <0.03 ng/mL (ref <0.03)

## 2019-01-28 MED ORDER — LIDOCAINE VISCOUS HCL 2 % MT SOLN
15.0000 mL | Freq: Once | OROMUCOSAL | Status: AC
  Administered 2019-01-28: 15 mL via ORAL
  Filled 2019-01-28: qty 15

## 2019-01-28 MED ORDER — ALUM & MAG HYDROXIDE-SIMETH 200-200-20 MG/5ML PO SUSP
30.0000 mL | Freq: Once | ORAL | Status: AC
  Administered 2019-01-28: 30 mL via ORAL
  Filled 2019-01-28: qty 30

## 2019-01-28 NOTE — ED Triage Notes (Signed)
Pt reports hx of lower back pain but says tonight the pain is "behind my heart. Like a heartburn but in my back". Pt reports getting an xray of his back at his pcp today but hasnt gotten results yet. Pt reports this hasn't ever happened in the past. Also reports feeling a lump in his throat with the symptoms. Denies Cough, fever, N/V/D.

## 2019-01-28 NOTE — Discharge Instructions (Signed)
Continue taking prilosec for now, especially while taking NSAIDs (aleve, motrin, naprosyn, inuprofen, etc).  Can use maalox for immediate release if needed. Try to watch diet-- spicy and acidic foods can worsen the burning pain. Follow-up with your primary care doctor.  Labs and x-ray reports on back. Return here for any new/acute changes.

## 2019-01-28 NOTE — ED Notes (Signed)
Pt given and verbalized understanding of d/c instructions and need for follow up with pcp. Told to return if s/s worsen. No further distress or questions upon ambulation out of department.  

## 2019-01-28 NOTE — ED Notes (Signed)
Pt resting in bed. Meds given, Labs drawn and EKG done. NAD

## 2019-01-28 NOTE — ED Provider Notes (Addendum)
HOSPITAL-EMERGENCY DEPT Provider Note   CSN: [id number] Arrival date & time: 01/28/19  0227    History   Chief Complaint Chief Complaint  Patient presents with  . Back Pain  . Sore Throat    HPI Marcus Bullock is a 33 y.o. male.     The history is provided by the patient and medical records.  Back Pain  Sore Throat      33 y.o. M here with left upper back pain.  He states history of chronic low back pain for 2+ years for which he has seen, PCP, orthopedist, chiropractor, and acupuncturist.  States over the past week he has been having a "burning" pain in his left upper back.  States he feels like it is "heartburn" but in his back.  States he has never had this before.  He denies any pain of the anterior chest, shortness of breath, nausea, vomiting, dizziness, diaphoresis, numbness, or weakness.  States tonight he did feel like there was a "lump" in his throat which he has never experienced before.  He does not have any difficulty swallowing or issues eating/drinking.  He has no known cardiac history.  No family cardiac history.  He is not a smoker.  He was recently restarted on Prilosec and is only been taking this for 2 days.  He did try taking some Tums prior to arrival which did not change anything.  He was seen by PCP yesterday and felt this is likely musculoskeletal and had thoracic spine films performed but these have not yet been read.  States he ultimately came in tonight because he was looking up his symptoms on Google and was concern for heart disease.  Past Medical History:  Diagnosis Date  . Left ankle sprain   . 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 Ankle impingement syndrome 08/11/2018  . Breast skin changes 01/18/2017  . Seasonal allergic rhinitis 01/18/2017  . Acute low back pain 01/18/2017    No past surgical history on file.       Home Medications    Prior to Admission medications   Not on File    Family History Family History  Problem Relation Age of Onset  . Alcohol abuse Father   . Heart disease Maternal Grandmother   . Cancer Paternal Grandmother        breast  . Thyroid disease Mother     Social History Social History   Tobacco Use  . Smoking status: Never Smoker  . Smokeless tobacco: Never Used  Substance Use Topics  . Alcohol use: Yes    Comment: under safe drinking levels  . Drug use: Not Currently     Allergies   Bee venom   Review of Systems Review of Systems  Musculoskeletal: Positive for back pain.  All other systems reviewed and are negative.    Physical Exam Updated Vital Signs BP (!) 159/91 (BP Location: Left Arm)   Pulse 100   Temp 98.6 F (37 C) (Oral)   Resp 15   Ht  (1.803 m)   Wt 90.7 kg   SpO2 100%   BMI 27.89 kg/m   Physical Exam Vitals signs and nursing note reviewed.  Constitutional:      Appearance: He is well-developed.  HENT:     Head: Normocephalic and atraumatic.     Mouth/Throat:  Comments: Oropharynx is clear, no tonsillar edema or exudates, handling secretions well, normal phonation without stridor Eyes:     Conjunctiva/sclera: Conjunctivae normal.     Pupils: Pupils are equal, round, and reactive to light.  Neck:     Musculoskeletal: Normal range of motion.  Cardiovascular:     Rate and Rhythm: Normal rate and regular rhythm.     Heart sounds: Normal heart sounds.  Pulmonary:     Effort: Pulmonary effort is normal.     Breath sounds: Normal breath sounds.     Comments: Lungs clear without any wheezes or rhonchi Chest:     Comments: Chest wall is nontender Abdominal:     General: Bowel sounds are normal.     Palpations: Abdomen is soft.  Musculoskeletal: Normal range of motion.       Back:     Comments: Focal area of tenderness to left thoracic paraspinal musculature as depicted, there is no appreciable bony deformity,  bruising, or signs of trauma Normal range of motion of both arms, normal strength and sensation throughout, ambulatory with steady gait  Skin:    General: Skin is warm and dry.  Neurological:     Mental Status: He is alert and oriented to person, place, and time.      ED Treatments / Results  Labs (all labs ordered are listed, but only abnormal results are displayed) Labs Reviewed  BASIC METABOLIC PANEL - Abnormal; Notable for the following components:      Result Value   Calcium 8.8 (*)    All other components within normal limits  CBC WITH DIFFERENTIAL/PLATELET  TROPONIN I    EKG EKG Interpretation  Date/Time:  Tuesday January 28 2019 03:19:39 EDT Ventricular Rate:  83 PR Interval:    QRS Duration: 106 QT Interval:  365 QTC Calculation: 429 R Axis:   69 Text Interpretation:  Sinus rhythm No previous ECGs available Confirmed by Zadie Rhine ([id number]) on 01/28/2019 3:27:31 AM   Radiology Dg Chest 2 View  Result Date: 01/28/2019 CLINICAL DATA:  33 year old male with chest and back pain. EXAM: CHEST - 2 VIEW COMPARISON:  None. FINDINGS: Lung volumes and mediastinal contours are within normal limits. Visualized tracheal air column is within normal limits. No pneumothorax, pulmonary edema, pleural effusion or confluent pulmonary opacity. Negative visible osseous structures. Negative visible bowel gas pattern. IMPRESSION: Negative.  No cardiopulmonary abnormality. Electronically Signed   By: Odessa Fleming M.D.   On: 01/28/2019 03:34    Procedures Procedures (including critical care time)  Medications Ordered in ED Medications  alum & mag hydroxide-simeth (MAALOX/MYLANTA) 200-200-20 MG/5ML suspension 30 mL (30 mLs Oral Given 01/28/19 0319)    And  lidocaine (XYLOCAINE) 2 % viscous mouth solution 15 mL (15 mLs Oral Given 01/28/19 0320)     Initial Impression / Assessment and Plan / ED Course  I have reviewed the triage vital signs and the nursing notes.  Pertinent labs &  imaging results that were available during my care of the patient were reviewed by me and considered in my medical decision making (see chart for details).  33 y.o. M here with mid back pain.  States it feels like "heartburn" but in his back.  States he has chronic back pain for the past 2+ years but this feels different.  No cough, fever, sore throat, or other URI symptoms.  He is afebrile and nontoxic in appearance.  Has some tenderness of the left upper back as depicted but no trauma or gross  deformity.  He has no neurologic deficits.  Chest wall is nontender and oropharynx is clear.  States he was googling symptoms online and was concerned for heart disease.  He has not previously had any cardiac evaluation so will obtain basic labs, troponin, EKG, chest x-ray.  He was given GI cocktail.  Reassess.  4:23 AM Patient reassessed, states he is feeling better.  Burning has decreased significantly.  We reviewed his EKG, labs, and imaging studies which are all reassuring.  I do not feel this represents ACS, PE, dissection, or other acute cardiac process.  Patient now discloses to me that he has been following a rather poor diet recently and eating a lot of spicy food.  He is also been taking more Aleve as this has been helping with his back pain more than anything.  He also did pushups and went running today.  He does have some degree of chronic back pain which is known, but portions of his history today are concerning for acid reflux.  He was told recently to stop taking his Prilosec as symptoms felt to be musculoskeletal in origin.  I recommended that he restart the Prilosec, especially while taking anti-inflammatories.  He will modify diet.  He will follow-up with his PCP.  Return here for any new or acute changes.  Final Clinical Impressions(s) / ED Diagnoses   Final diagnoses:  Upper back pain on left side    ED Discharge Orders    None       Garlon HatchetSanders, Andreea Arca M, PA-C 01/28/19 0435    Garlon HatchetSanders,  Dahl Higinbotham M, PA-C 01/28/19 0435    Zadie RhineWickline, Donald, MD 01/28/19 [phone]

## 2019-01-28 NOTE — ED Notes (Signed)
Pt transported to xray 

## 2019-02-06 ENCOUNTER — Ambulatory Visit (INDEPENDENT_AMBULATORY_CARE_PROVIDER_SITE_OTHER): Payer: BC Managed Care – PPO | Admitting: Adult Health

## 2019-02-06 ENCOUNTER — Other Ambulatory Visit: Payer: Self-pay

## 2019-02-06 DIAGNOSIS — G8929 Other chronic pain: Secondary | ICD-10-CM | POA: Diagnosis not present

## 2019-02-06 DIAGNOSIS — M545 Low back pain: Secondary | ICD-10-CM | POA: Diagnosis not present

## 2019-02-06 DIAGNOSIS — K219 Gastro-esophageal reflux disease without esophagitis: Secondary | ICD-10-CM | POA: Diagnosis not present

## 2019-02-06 MED ORDER — OMEPRAZOLE 20 MG PO CPDR: 20.0000 mg | DELAYED_RELEASE_CAPSULE | Freq: Every day | ORAL | 3 refills | Status: DC

## 2019-02-06 MED ORDER — MELOXICAM 15 MG PO TABS: 15.0000 mg | ORAL_TABLET | Freq: Every day | ORAL | 0 refills | Status: DC

## 2019-02-07 ENCOUNTER — Encounter: Payer: Self-pay | Admitting: Adult Health

## 2019-02-07 NOTE — Progress Notes (Addendum)
Virtual Visit via Video Note  I connected with Marcus Bullock on 02/06/2019 at  3:00 PM EDT by a video enabled telemedicine application and verified that I am speaking with the correct person using two identifiers.  Location patient: home Location provider:work or home office Persons participating in the virtual visit: patient, provider  I discussed the limitations of evaluation and management by telemedicine and the availability of in person appointments. The patient expressed understanding and agreed to proceed.   HPI: 33 year old male who is being evaluated today for follow-up after recent ER visit.  He was seen in the ER for "burning" pain in his left upper back.  He describes it as "heartburn" but in his back.  He had never had this happen to him before and so he decided to be seen in the emergency room after googling his symptoms.  He denied any pain to the anterior chest, shortness of breath, nausea, vomiting, dizziness, diarrhea.  He did report that he felt as though there was a "lump" in his throat which she had never experienced before either.  Feel like he was have any difficulty swallowing.  In the ER labs, EKG, and chest x-ray were unremarkable.  He disclosed that he was eating a rather poor diet and eating a lot of spicy foods as well as taking more Aleve to help with his chronic back pain.  It felt as though his symptoms were due to acid reflux, he was restarted on over-the-counter Prilosec and discharged.  Today he reports that he is having much less frequent "heartburn".  He has been taking Prilosec for approximately 2 weeks and has started eating healthier.  He continues to take Aleve throughout the day to help with his chronic back pain.  His report that he was seen by Delbert HarnessMurphy Wainer had to steroid injections to bilateral SI joints.  He does report some relief of chronic back pain with the steroid injections.  He is also done 3 episodes of acupuncture which he reports that the  last time he went was prior to COVID restrictions being placed and after the puncture was finished he had a 1 day window without any discomfort.  He plans to follow-up with Delbert HarnessMurphy Wainer as well as the acupuncturist going forward.   ROS: See pertinent positives and negatives per HPI.  Past Medical History:  Diagnosis Date  . Left ankle sprain   . 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  Family History  Problem Relation Age of Onset  . Alcohol abuse Father   . Heart disease Maternal Grandmother   . Cancer Paternal Grandmother        breast  . Thyroid disease Mother      Current Outpatient Medications:  .  meloxicam (MOBIC) 15 MG tablet, Take 1 tablet (15 mg total) by mouth daily., Disp: 30 tablet, Rfl: 0 .  omeprazole (PRILOSEC) 20 MG capsule, Take 1 capsule (20 mg total) by mouth daily., Disp: 30 capsule, Rfl: 3  EXAM:  VITALS per patient if applicable:  GENERAL: alert, oriented, appears well and in no acute distress  HEENT: atraumatic, conjunttiva clear, no obvious abnormalities on inspection of external nose and ears  NECK: normal movements of the head and neck  LUNGS: on inspection no signs of respiratory distress, breathing rate appears normal, no obvious gross SOB, gasping or wheezing  CV: no obvious cyanosis  MS: moves all visible extremities  without noticeable abnormality  PSYCH/NEURO: pleasant and cooperative, no obvious depression or anxiety, speech and thought processing grossly intact  ASSESSMENT AND PLAN:  Discussed the following assessment and plan:  GERD symptoms seem to be improving.  We will continue him on Prilosec.  Will switch from over-the-counter NSAIDs to Mobic.  He was advised not to take any additional NSAIDs with Mobic.  Advised to follow-up if symptoms do not resolve in the next 2 weeks at that time we will consider referral to gastroenterology  Gastroesophageal reflux  disease without esophagitis - Plan: omeprazole (PRILOSEC) 20 MG capsule  Chronic bilateral low back pain without sciatica - Plan: meloxicam (MOBIC) 15 MG tablet   I discussed the assessment and treatment plan with the patient. The patient was provided an opportunity to ask questions and all were answered. The patient agreed with the plan and demonstrated an understanding of the instructions.   The patient was advised to call back or seek an in-person evaluation if the symptoms worsen or if the condition fails to improve as anticipated.   Shirline Frees, NP

## 2019-03-11 ENCOUNTER — Other Ambulatory Visit: Payer: Self-pay | Admitting: Adult Health

## 2019-03-11 DIAGNOSIS — G8929 Other chronic pain: Secondary | ICD-10-CM

## 2019-04-29 ENCOUNTER — Other Ambulatory Visit: Payer: Self-pay

## 2019-04-29 ENCOUNTER — Ambulatory Visit (INDEPENDENT_AMBULATORY_CARE_PROVIDER_SITE_OTHER): Payer: BC Managed Care – PPO | Admitting: Adult Health

## 2019-04-29 ENCOUNTER — Encounter: Payer: Self-pay | Admitting: Adult Health

## 2019-04-29 DIAGNOSIS — M5412 Radiculopathy, cervical region: Secondary | ICD-10-CM

## 2019-04-29 MED ORDER — PREDNISONE 10 MG PO TABS: ORAL_TABLET | ORAL | 0 refills | Status: DC

## 2019-04-29 MED ORDER — BACLOFEN 10 MG PO TABS: 10.0000 mg | ORAL_TABLET | Freq: Every day | ORAL | 0 refills | Status: DC

## 2019-04-29 NOTE — Progress Notes (Signed)
Virtual Visit via Video Note  I connected with Marcus Bullock on 04/29/19 at  8:30 AM EDT by a video enabled telemedicine application and verified that I am speaking with the correct person using two identifiers.  Location patient: home Location provider:work or home office Persons participating in the virtual visit: patient, provider  I discussed the limitations of evaluation and management by telemedicine and the availability of in person appointments. The patient expressed understanding and agreed to proceed.   HPI: 33 year old male who is being evaluated today for upper back pain.  He reports his symptoms started around May 2021.  Symptoms include intermittent numbness down his left arm and a constant aching pain under his right shoulder blade.  He has had massages done which seemed to help for a short period of time.    Unfortunately, he continues to have low back pain.  He is currently doing physical therapy.  He has been seen by multiple the pediatric doctors without clear-cut cause, has had MRI of the lumbar spine which did not show cause.  He is also done acupuncture and steroid injections.  Some of the modalities helped for short period of time but ultimately his pain comes back.  He denies numbness or tingling down his lower extremities.   ROS: See pertinent positives and negatives per HPI.  Past Medical History:  Diagnosis Date  . Left ankle sprain   . 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  Family History  Problem Relation Age of Onset  . Alcohol abuse Father   . Heart disease Maternal Grandmother   . Cancer Paternal Grandmother        breast  . Thyroid disease Mother       Current Outpatient Medications:  .  meloxicam (MOBIC) 15 MG tablet, TAKE 1 TABLET(15 MG) BY MOUTH DAILY, Disp: 90 tablet, Rfl: 0 .  omeprazole (PRILOSEC) 20 MG capsule, Take 1 capsule (20 mg total) by mouth daily.,  Disp: 30 capsule, Rfl: 3  EXAM:  VITALS per patient if applicable:  GENERAL: alert, oriented, appears well and in no acute distress  HEENT: atraumatic, conjunttiva clear, no obvious abnormalities on inspection of external nose and ears  NECK: normal movements of the head and neck  LUNGS: on inspection no signs of respiratory distress, breathing rate appears normal, no obvious gross SOB, gasping or wheezing  CV: no obvious cyanosis  MS: moves all visible extremities without noticeable abnormality  PSYCH/NEURO: pleasant and cooperative, no obvious depression or anxiety, speech and thought processing grossly intact  ASSESSMENT AND PLAN:  Discussed the following assessment and plan:  1. Cervical radiculitis -Consider MRI in the future symptoms do not resolve.  Could also consider dry needling or trigger point injections - predniSONE (DELTASONE) 10 MG tablet; 40 mg x 3 days, 20 mg x 3 days, 10 mg x 3 days  Dispense: 21 tablet; Refill: 0 - baclofen (LIORESAL) 10 MG tablet; Take 1 tablet (10 mg total) by mouth at bedtime.  Dispense: 10 each; Refill: 0  I discussed the assessment and treatment plan with the patient. The patient was provided an opportunity to ask questions and all were answered. The patient agreed with the plan and demonstrated an understanding of the instructions.   The patient was advised to call back or seek an in-person evaluation if the symptoms worsen or if the condition fails to improve as anticipated.   [name],  NP

## 2019-05-23 ENCOUNTER — Other Ambulatory Visit: Payer: Self-pay | Admitting: Adult Health

## 2019-05-23 DIAGNOSIS — K219 Gastro-esophageal reflux disease without esophagitis: Secondary | ICD-10-CM

## 2019-05-27 ENCOUNTER — Encounter: Payer: Self-pay | Admitting: Family Medicine

## 2019-05-27 NOTE — Telephone Encounter (Signed)
Filled for 30 days.  Pt past due for cpx and lab work.  Letter released to MyChart.

## 2019-06-19 ENCOUNTER — Encounter: Payer: Self-pay | Admitting: Adult Health

## 2019-06-19 ENCOUNTER — Other Ambulatory Visit: Payer: Self-pay

## 2019-06-19 ENCOUNTER — Ambulatory Visit: Payer: BC Managed Care – PPO | Admitting: Adult Health

## 2019-06-19 VITALS — BP 118/80 | HR 89 | Temp 97.5°F | Wt 200.4 lb

## 2019-06-19 DIAGNOSIS — M791 Myalgia, unspecified site: Secondary | ICD-10-CM | POA: Diagnosis not present

## 2019-06-19 MED ORDER — FLUOXETINE HCL 20 MG PO CAPS: 20.0000 mg | ORAL_CAPSULE | Freq: Every day | ORAL | 1 refills | Status: DC

## 2019-06-19 MED ORDER — AMITRIPTYLINE HCL 10 MG PO TABS: 10.0000 mg | ORAL_TABLET | Freq: Every day | ORAL | 0 refills | Status: DC

## 2019-06-19 NOTE — Progress Notes (Signed)
Subjective:    Patient ID: Marcus Bullock, male    DOB: 1985/10/28, 33 y.o.   MRN: [id number]  HPI  33 year old male who  has a past medical history of Left ankle sprain and Skin irritation.   He presents for follow-up regarding back pain.  He has recurrent episodes of low back pain which have been present for over a year and a half.  He has been worked up extensively for this issue with no clear-cut cause.  Recently he was seen in July 2020 for upper back pain.  His symptoms have been unchanged since the last visit.  There is no known injury and the pain is located in the left intrascapular area.  The pain is described as burning and dull and occurs constantly.  His symptoms can be exacerbated by standing, sitting for prolonged periods of time, laying on his left side, raising the left arm, and direct pressure.  He is recently completed physical therapy for 2 months and reported intermittent relief with this.  Massage also helps.  In the past she has tried chiropractic manipulation, dry needling, acupuncture, muscle relaxants, rest, and stretching all that provided no relief.  He has been seen by multiple orthopedists and had had an MRI of his lumbar spine which not show a clear cause.  Decisional symptoms include fatigue and has had intermittent episodes of numbness and tingling in his left leg.  Review of Systems See HPI   Past Medical History:  Diagnosis Date  . Left ankle sprain   . 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 Marital status: Single    Spouse name: Not on file  . Number of children: Not on file  . Years of education: Not on file  . Highest education level: Not on file  Occupational History  . Not on file  Social Needs  . Financial resource strain: Not on file  . Food insecurity    Worry: Not on file    Inability: Not on file  . Transportation needs    Medical: Not on file    Non-medical: Not on file  Tobacco Use  . Smoking status: Never Smoker  . Smokeless tobacco: Never Used  Substance and Sexual Activity  . Alcohol use: Yes    Comment: under safe drinking levels  . Drug use: Not Currently  . Sexual activity: Yes    Comment: male partner  Lifestyle  . Physical activity    Days per week: Not on file    Minutes per session: Not on file  . Stress: Not on file  Relationships  . Social Herbalist on phone: Not on file    Gets together: Not on file    Attends religious service: Not on file    Active member of club or organization: Not on file    Attends meetings of clubs or organizations: Not on file    Relationship status: Not on file  . Intimate partner violence    Fear of current or ex partner: Not on file    Emotionally abused: Not on file    Physically abused: Not on file    Forced sexual activity: Not on file  Other Topics Concern  . Not on file  Social History Narrative   Automotive engineer    Not married    No kids  Likes to work out and bowl.       No past surgical history on file.  Family History  Problem Relation Age of Onset  . Alcohol abuse Father   . Heart disease Maternal Grandmother   . Cancer Paternal Grandmother        breast  . Thyroid disease Mother     Allergies  Allergen Reactions  . Bee Venom Swelling    Current Outpatient Medications on File Prior to Visit  Medication Sig Dispense Refill  . meloxicam (MOBIC) 15 MG tablet TAKE 1 TABLET(15 MG) BY MOUTH DAILY 90 tablet 0  . omeprazole (PRILOSEC) 20 MG capsule TAKE 1 CAPSULE(20 MG) BY MOUTH DAILY 30 capsule 0   No current facility-administered medications on file prior to visit.     BP 118/80 (BP Location: Left Arm, Patient Position: Sitting, Cuff Size: Normal)   Pulse 89   Temp (!) 97.5 F (36.4 C) (Oral)   Wt 200 lb 6.4 oz (90.9 kg)   SpO2 98%   BMI 27.95 kg/m       Objective:   Physical Exam Vitals signs and nursing note  reviewed.  Constitutional:      Appearance: Normal appearance.  Cardiovascular:     Rate and Rhythm: Normal rate and regular rhythm.     Pulses: Normal pulses.     Heart sounds: Normal heart sounds.  Pulmonary:     Effort: Pulmonary effort is normal.     Breath sounds: Normal breath sounds.  Musculoskeletal: Normal range of motion.        General: Tenderness present.     Comments: Does have tenderness with palpation to bilateral external obliques throughout the left intrascapular region.  Skin:    General: Skin is warm and dry.     Capillary Refill: Capillary refill takes less than 2 seconds.  Neurological:     General: No focal deficit present.     Mental Status: He is alert and oriented to person, place, and time.  Psychiatric:        Mood and Affect: Mood normal.        Thought Content: Thought content normal.        Judgment: Judgment normal.        Assessment & Plan:  1. Muscular pain -No definitive cause.  At this point in time cannot rule out fibromyalgia.  Will start on low-dose Prozac as well as Elavil.  And refer to rheumatology. -Follow-up in 30 days or sooner if needed - FLUoxetine (PROZAC) 20 MG capsule; Take 1 capsule (20 mg total) by mouth at bedtime.  Dispense: 30 capsule; Refill: 1 - amitriptyline (ELAVIL) 10 MG tablet; Take 1 tablet (10 mg total) by mouth at bedtime.  Dispense: 30 tablet; Refill: 0 - Ambulatory referral to Rheumatology   Shirline Freesory Jayla Mackie, NP

## 2019-06-26 ENCOUNTER — Encounter: Payer: Self-pay | Admitting: Adult Health

## 2019-06-27 ENCOUNTER — Other Ambulatory Visit: Payer: Self-pay | Admitting: Adult Health

## 2019-06-27 DIAGNOSIS — M545 Low back pain, unspecified: Secondary | ICD-10-CM

## 2019-06-27 DIAGNOSIS — M5412 Radiculopathy, cervical region: Secondary | ICD-10-CM

## 2019-07-13 LAB — BASIC METABOLIC PANEL
BUN: 10 (ref 4–21)
Creatinine: 1.1 (ref 0.6–1.3)
Glucose: 81
Potassium: 4.2 (ref 3.4–5.3)
Sodium: 141 (ref 137–147)

## 2019-07-13 LAB — CBC AND DIFFERENTIAL
HCT: 42 (ref 41–53)
Hemoglobin: 14 (ref 13.5–17.5)
Platelets: 262 (ref 150–399)
WBC: 5.6

## 2019-07-13 LAB — POCT ERYTHROCYTE SEDIMENTATION RATE, NON-AUTOMATED: Sed Rate: 0

## 2019-07-13 LAB — TSH: TSH: 1.54 (ref 0.41–5.90)

## 2019-07-13 LAB — HEPATIC FUNCTION PANEL
ALT: 30 (ref 10–40)
AST: 17 (ref 14–40)
Alkaline Phosphatase: 48 (ref 25–125)
Bilirubin, Total: 0.5

## 2019-07-17 ENCOUNTER — Ambulatory Visit
Admission: RE | Admit: 2019-07-17 | Discharge: 2019-07-17 | Disposition: A | Discharge status: Home / Self Care | Payer: BC Managed Care – PPO | Source: Ambulatory Visit | Attending: Adult Health | Admitting: Adult Health

## 2019-07-17 ENCOUNTER — Telehealth: Payer: Self-pay | Admitting: Adult Health

## 2019-07-17 ENCOUNTER — Encounter: Payer: Self-pay | Admitting: Family Medicine

## 2019-07-17 ENCOUNTER — Other Ambulatory Visit: Payer: Self-pay

## 2019-07-17 DIAGNOSIS — M5412 Radiculopathy, cervical region: Secondary | ICD-10-CM

## 2019-07-17 DIAGNOSIS — M546 Pain in thoracic spine: Secondary | ICD-10-CM

## 2019-07-17 DIAGNOSIS — M545 Low back pain, unspecified: Secondary | ICD-10-CM

## 2019-07-17 NOTE — Telephone Encounter (Signed)
Spoke to the patient and informed him of his MRI of cervical and thoracic spine  Cervical spine MRI was normal  Thoracic spine MRI  Mild, non-compressive disc bulges at T2-3, and T3-4. more pronounced disc degeneration from T5-6 through T8-9. Small disc protrusions at those for levels, largest at the T8-9 level, which indents the ventral subarachnoid space but does not affect cord. No evidence of foraminal extension. No abnormality seen from T9-10 through T12-L1.  Reports that he continues to have back pain daily and has not noticed anything that provides relief. He would like to be referred to neurosurgery

## 2019-07-22 IMAGING — MR MR LUMBAR SPINE W/O CM
5 series · 48 of 48 positions shown · non-contrast
Comparison: None.

CLINICAL DATA: Chronic low back pain and tightness. Bilateral leg
pain. Symptoms of several years duration.

EXAM:
MRI LUMBAR SPINE WITHOUT CONTRAST
TECHNIQUE: Multiplanar, multisequence MR imaging of the lumbar spine was
performed. No intravenous contrast was administered.

[Series 3: tirm sag · sagittal · 4.0mm · 0.57mm/px · 4 of 13 slices shown]
[im 1/13]
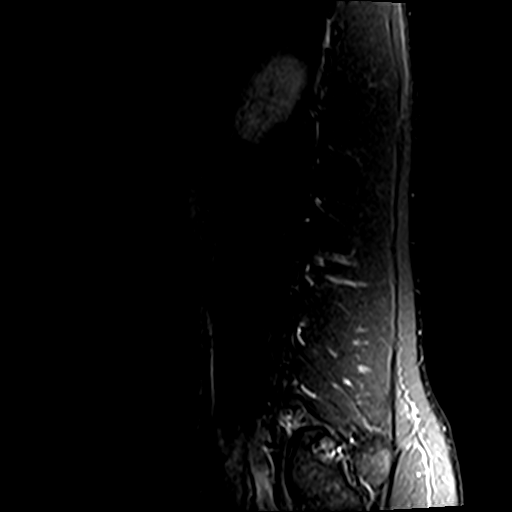
[im 5/13]
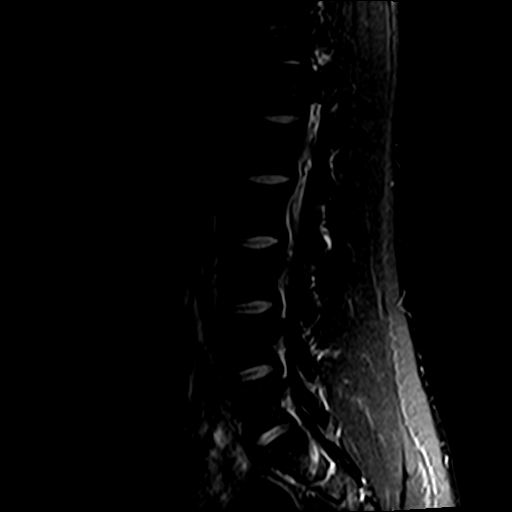
[im 9/13]
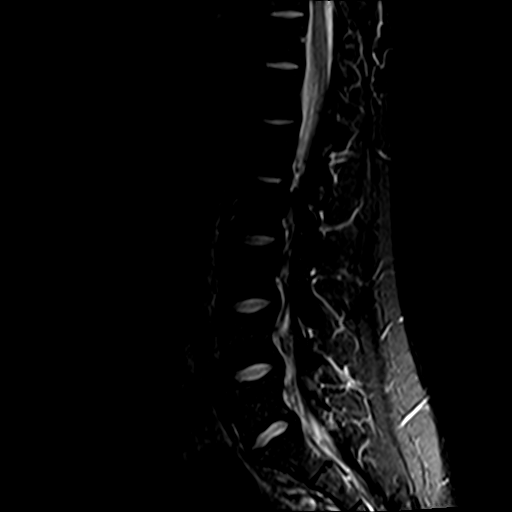
[im 13/13]
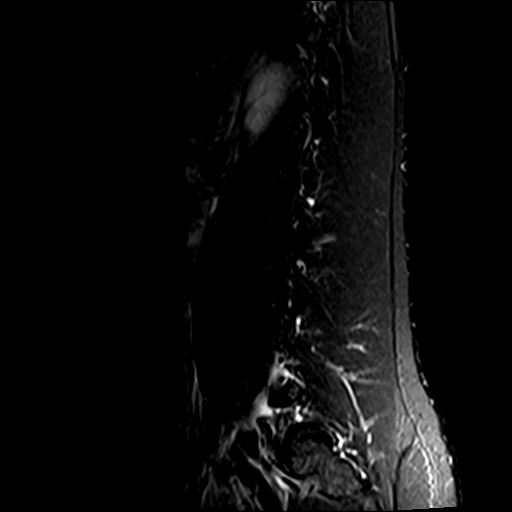

[Series 4: T2 · sagittal · 4.0mm · 0.91mm/px · 5 of 13 slices shown (1 of 2)]
[im 1/13]
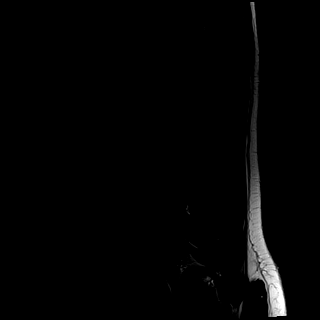
[im 4/13]
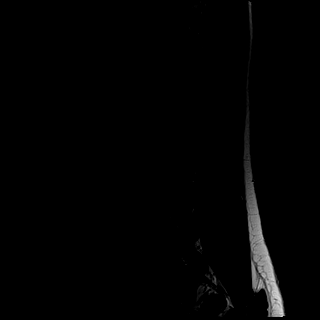
[im 7/13]
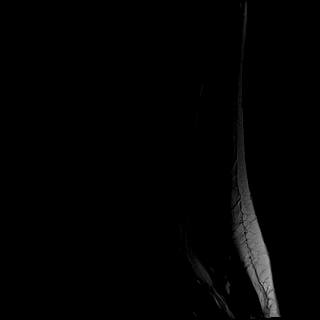
[im 10/13]
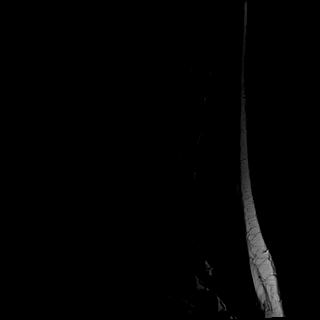
[im 13/13]
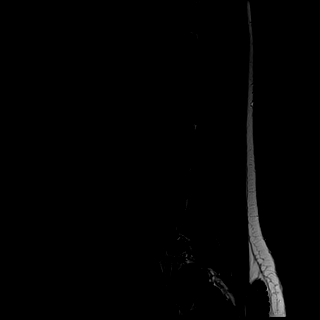

[Series 5: T1 · sagittal · 4.0mm · 0.91mm/px · 5 of 13 slices shown (1 of 2)]
[im 1/13]
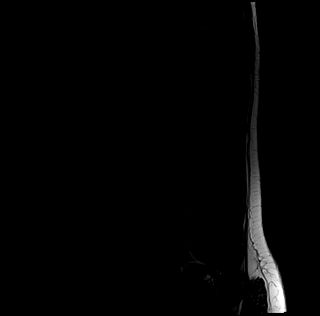
[im 4/13]
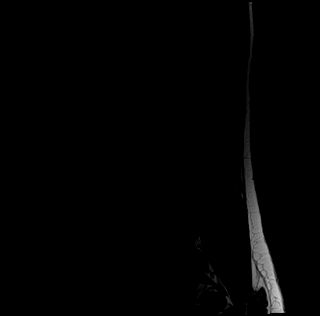
[im 7/13]
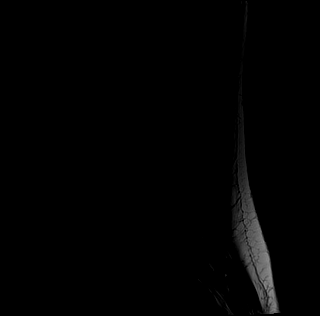
[im 10/13]
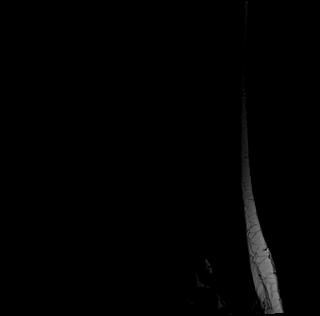
[im 13/13]
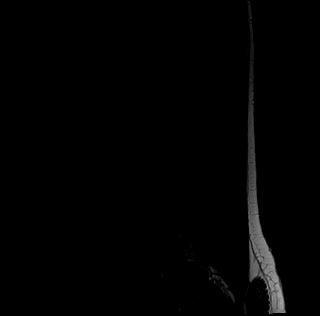

[Series 6: T1 · axial · 4.0mm · 0.78mm/px · z∈[+78,+306]mm · 17 of 44 slices shown (2 of 2)]
[im 1/44]
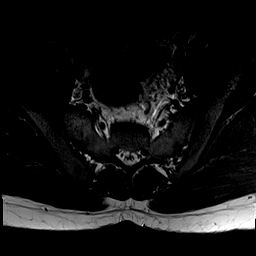
[im 3/44]
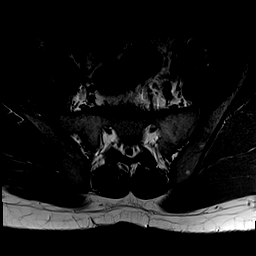
[im 6/44]
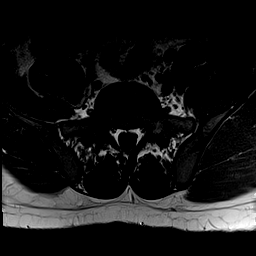
[im 9/44]
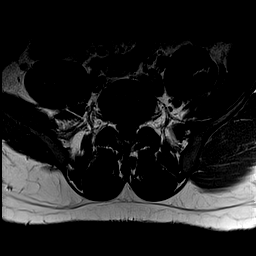
[im 11/44]
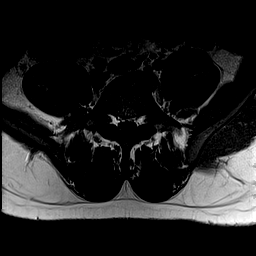
[im 14/44]
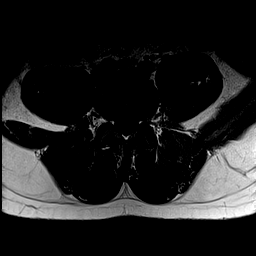
[im 17/44]
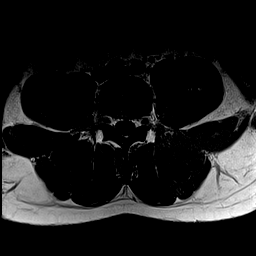
[im 19/44]
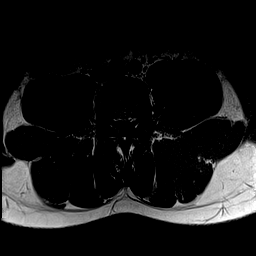
[im 22/44]
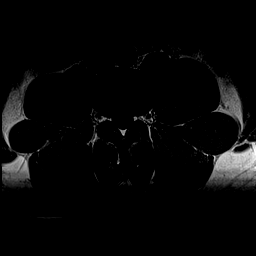
[im 25/44]
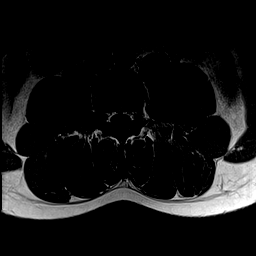
[im 27/44]
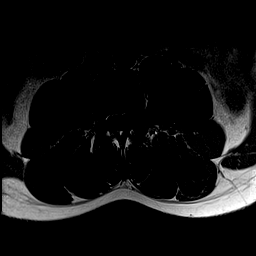
[im 30/44]
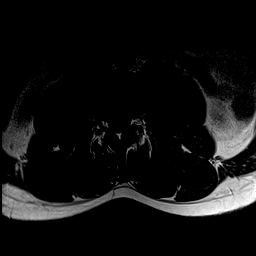
[im 33/44]
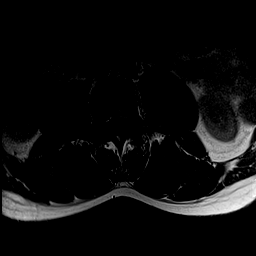
[im 35/44]
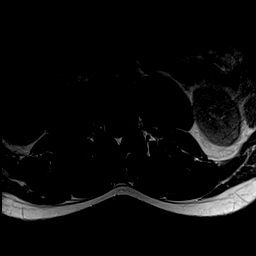
[im 38/44]
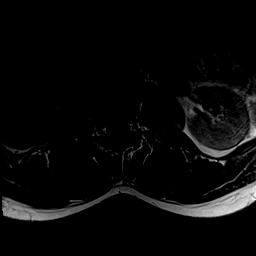
[im 41/44]
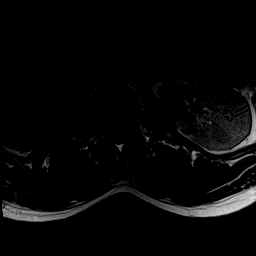
[im 44/44]
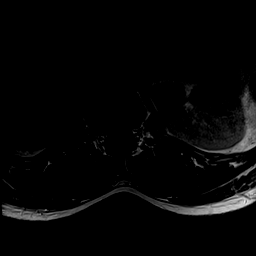

[Series 7: T2 · axial · 4.0mm · 0.78mm/px · z∈[+78,+306]mm · 17 of 44 slices shown (2 of 2)]
[im 1/44]
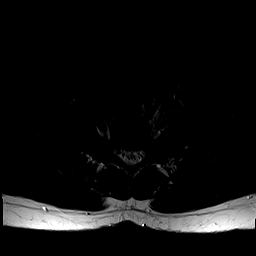
[im 3/44]
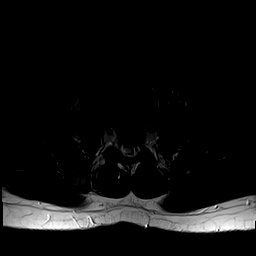
[im 6/44]
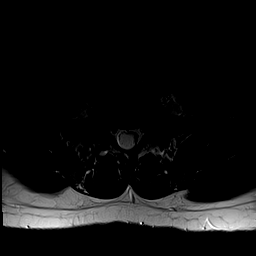
[im 9/44]
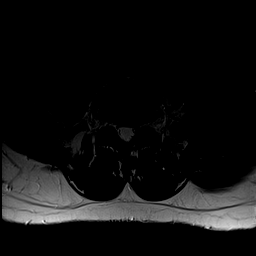
[im 11/44]
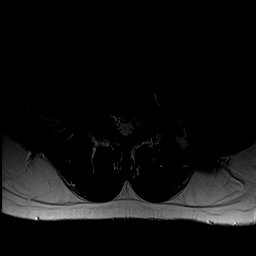
[im 14/44]
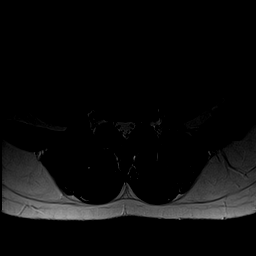
[im 17/44]
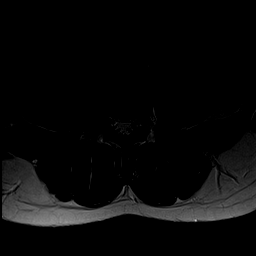
[im 19/44]
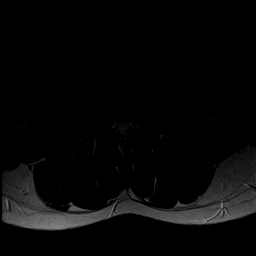
[im 22/44]
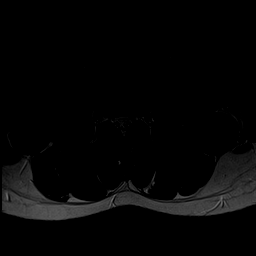
[im 25/44]
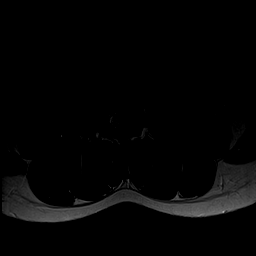
[im 27/44]
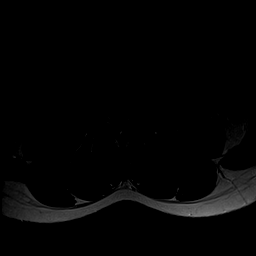
[im 30/44]
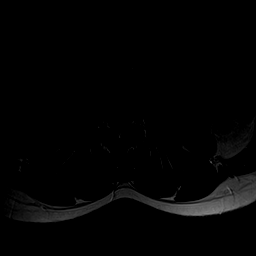
[im 33/44]
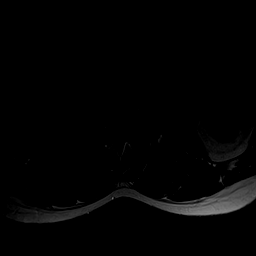
[im 35/44]
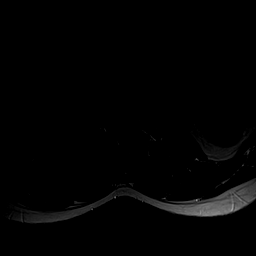
[im 38/44]
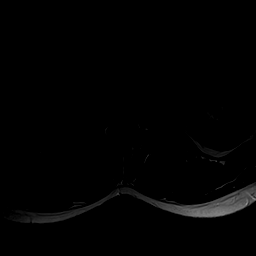
[im 41/44]
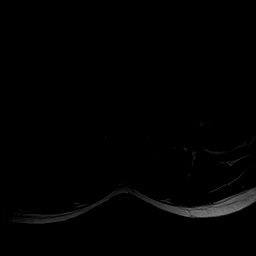
[im 44/44]
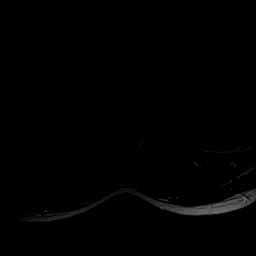

[48 of 48 positions shown; findings below may reference images not displayed]

FINDINGS: Segmentation:  5 lumbar type vertebral bodies assumed.

Alignment: Minimal curvature convex to the right with the apex at
L2-3.

Vertebrae:  Normal

Conus medullaris and cauda equina: Conus extends to the T12-L1
level. Conus and cauda equina appear normal.

Paraspinal and other soft tissues: Normal

Disc levels:

No degenerative disc disease. No bulge or herniation. No facet
arthropathy or pars defect. Wide patency of the canal and foramina.
IMPRESSION: Normal examination, with the exception of very mild curvature convex
to the right. No degenerative changes. No stenosis. No cause of the
presenting symptoms is identified.

## 2019-07-30 ENCOUNTER — Encounter: Payer: Self-pay | Admitting: Adult Health

## 2019-07-30 ENCOUNTER — Other Ambulatory Visit: Payer: Self-pay | Admitting: Adult Health

## 2019-07-30 DIAGNOSIS — M5412 Radiculopathy, cervical region: Secondary | ICD-10-CM

## 2019-08-06 ENCOUNTER — Other Ambulatory Visit: Payer: Self-pay

## 2019-08-06 ENCOUNTER — Other Ambulatory Visit (HOSPITAL_COMMUNITY)
Admission: RE | Admit: 2019-08-06 | Discharge: 2019-08-06 | Disposition: A | Discharge status: Home / Self Care | Payer: BC Managed Care – PPO | Source: Ambulatory Visit | Attending: Adult Health | Admitting: Adult Health

## 2019-08-06 ENCOUNTER — Ambulatory Visit (INDEPENDENT_AMBULATORY_CARE_PROVIDER_SITE_OTHER): Payer: BC Managed Care – PPO | Admitting: Adult Health

## 2019-08-06 ENCOUNTER — Encounter: Payer: Self-pay | Admitting: Adult Health

## 2019-08-06 VITALS — BP 134/86 | HR 87 | Temp 97.6°F | Ht 71.0 in | Wt 199.8 lb

## 2019-08-06 DIAGNOSIS — Z23 Encounter for immunization: Secondary | ICD-10-CM | POA: Diagnosis not present

## 2019-08-06 DIAGNOSIS — Z113 Encounter for screening for infections with a predominantly sexual mode of transmission: Secondary | ICD-10-CM | POA: Insufficient documentation

## 2019-08-06 DIAGNOSIS — R1084 Generalized abdominal pain: Secondary | ICD-10-CM | POA: Diagnosis not present

## 2019-08-06 DIAGNOSIS — Z0000 Encounter for general adult medical examination without abnormal findings: Secondary | ICD-10-CM | POA: Diagnosis not present

## 2019-08-06 DIAGNOSIS — M791 Myalgia, unspecified site: Secondary | ICD-10-CM | POA: Diagnosis not present

## 2019-08-06 LAB — COMPREHENSIVE METABOLIC PANEL
ALT: 41 U/L (ref 0–53)
AST: 25 U/L (ref 0–37)
Albumin: 4.5 g/dL (ref 3.5–5.2)
Alkaline Phosphatase: 44 U/L (ref 39–117)
BUN: 9 mg/dL (ref 6–23)
CO2: 30 mEq/L (ref 19–32)
Calcium: 9.2 mg/dL (ref 8.4–10.5)
Chloride: 102 mEq/L (ref 96–112)
Creatinine, Ser: 0.91 mg/dL (ref 0.40–1.50)
GFR: 115.29 mL/min (ref >60.00)
Glucose, Bld: 78 mg/dL (ref 70–99)
Potassium: 4 mEq/L (ref 3.5–5.1)
Sodium: 137 mEq/L (ref 135–145)
Total Bilirubin: 0.4 mg/dL (ref 0.2–1.2)
Total Protein: 7.1 g/dL (ref 6.0–8.3)

## 2019-08-06 LAB — CBC WITH DIFFERENTIAL/PLATELET
Basophils Absolute: 0 10*3/uL (ref 0.0–0.1)
Basophils Relative: 0.6 % (ref 0.0–3.0)
Eosinophils Absolute: 0 10*3/uL (ref 0.0–0.7)
Eosinophils Relative: 0.7 % (ref 0.0–5.0)
HCT: 41.4 % (ref 39.0–52.0)
Hemoglobin: 13.5 g/dL (ref 13.0–17.0)
Lymphocytes Relative: 23.4 % (ref 12.0–46.0)
Lymphs Abs: 1.5 10*3/uL (ref 0.7–4.0)
MCHC: 32.7 g/dL (ref 30.0–36.0)
MCV: 90.2 fl (ref 78.0–100.0)
Monocytes Absolute: 0.7 10*3/uL (ref 0.1–1.0)
Monocytes Relative: 11.1 % (ref 3.0–12.0)
Neutro Abs: 4.2 10*3/uL (ref 1.4–7.7)
Neutrophils Relative %: 64.2 % (ref 43.0–77.0)
Platelets: 227 10*3/uL (ref 150.0–400.0)
RBC: 4.58 Mil/uL (ref 4.22–5.81)
RDW: 12.4 % (ref 11.5–15.5)
WBC: 6.5 10*3/uL (ref 4.0–10.5)

## 2019-08-06 LAB — LIPID PANEL
Cholesterol: 146 mg/dL (ref 0–200)
HDL: 48.9 mg/dL (ref >39.00)
LDL Cholesterol: 75 mg/dL (ref 0–99)
NonHDL: 97.56
Total CHOL/HDL Ratio: 3
Triglycerides: 115 mg/dL (ref 0.0–149.0)
VLDL: 23 mg/dL (ref 0.0–40.0)

## 2019-08-06 LAB — HEMOGLOBIN A1C: Hgb A1c MFr Bld: 4.4 % — ABNORMAL LOW (ref 4.6–6.5)

## 2019-08-06 LAB — TSH: TSH: 1.82 u[IU]/mL (ref 0.35–4.50)

## 2019-08-06 NOTE — Progress Notes (Signed)
Subjective:    Patient ID: Marcus Bullock, male    DOB: 1985/10/04, 33 y.o.   MRN: [id number]  HPI Patient presents for yearly preventative medicine examination. He is a pleasant 33 year old male who  has a past medical history of Left ankle sprain and Skin irritation.  Back Pain/Muscular Pain -he has had back pain for 2 years.  Has been seen by multiple specialists including multiple orthopedists, neurosurgery, and most recently rheumatology.  He has had x-rays, RI of cervical, lumbar, and thoracic spine all of which showed no abnormality.  All of those work-ups have been unremarkable.  Is also tried acupuncture, physical therapy, and dry needling with minimal results.  He is currently in the process of being worked up for connective tissue disease with rheumatology.  He has also been referred to neurology and he reports that rheumatology has referred him to gastroenterology for lower abdominal pain.  He reports frustration due to unknown underlying condition.  Nothing he has done has helped with his symptoms.  He is unable to exercise or be active due to discomfort.   All immunizations and health maintenance protocols were reviewed with the patient and needed orders were placed. Due for seasonal flu vaccination.   Appropriate screening laboratory values were ordered for the patient including screening of hyperlipidemia, renal function and hepatic function. If indicated by BPH, a PSA was ordered.  Medication reconciliation,  past medical history, social history, problem list and allergies were reviewed in detail with the patient  Goals were established with regard to weight loss, exercise, and  diet in compliance with medications Wt Readings from Last 3 Encounters:  08/06/19 199 lb 12.8 oz (90.6 kg)  06/19/19 200 lb 6.4 oz (90.9 kg)  01/28/19 200 lb (90.7 kg)     Review of Systems  Constitutional: Negative.   HENT: Negative.   Respiratory: Negative.   Cardiovascular: Negative.    Gastrointestinal: Positive for abdominal pain.  Genitourinary: Negative.   Musculoskeletal: Positive for arthralgias, back pain, myalgias, neck pain and neck stiffness.  Neurological: Negative.   Hematological: Negative.   Psychiatric/Behavioral: Negative.   All other systems reviewed and are negative.  Past Medical History:  Diagnosis Date  . Left ankle sprain   . 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 Marital status: Single    Spouse name: Not on file  . Number of children: Not on file  . Years of education: Not on file  . Highest education level: Not on file  Occupational History  . Not on file  Social Needs  . Financial resource strain: Not on file  . Food insecurity    Worry: Not on file    Inability: Not on file  . Transportation needs    Medical: Not on file    Non-medical: Not on file  Tobacco Use  . Smoking status: Never Smoker  . Smokeless tobacco: Never Used  Substance and Sexual Activity  . Alcohol use: Yes    Comment: under safe drinking levels  . Drug use: Not Currently  . Sexual activity: Yes    Comment: male partner  Lifestyle  . Physical activity    Days per week: Not on file    Minutes per session: Not on file  . Stress: Not on file  Relationships  . Social connections    Talks on phone: Not on file  Gets together: Not on file    Attends religious service: Not on file    Active member of club or organization: Not on file    Attends meetings of clubs or organizations: Not on file    Relationship status: Not on file  . Intimate partner violence    Fear of current or ex partner: Not on file    Emotionally abused: Not on file    Physically abused: Not on file    Forced sexual activity: Not on file  Other Topics Concern  . 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        History reviewed. No pertinent surgical history.  Family History  Problem Relation Age of Onset  . Alcohol abuse Father   . Heart disease Maternal Grandmother   . Cancer Paternal Grandmother        breast  . Thyroid disease Mother     Allergies  Allergen Reactions  . Bee Venom Swelling    Current Outpatient Medications on File Prior to Visit  Medication Sig Dispense Refill  . naproxen sodium (ALEVE) 220 MG tablet Take 220 mg by mouth daily as needed.     No current facility-administered medications on file prior to visit.     BP 134/86 (BP Location: Left Arm, Patient Position: Sitting, Cuff Size: Normal)   Pulse 87   Temp 97.6 F (36.4 C) (Temporal)   Ht 5\' 11"  (1.803 m)   Wt 199 lb 12.8 oz (90.6 kg)   SpO2 100%   BMI 27.87 kg/m       Objective:   Physical Exam Vitals signs and nursing note reviewed.  Constitutional:      General: He is not in acute distress.    Appearance: Normal appearance. He is normal weight. He is not diaphoretic.  HENT:     Head: Normocephalic and atraumatic.     Right Ear: Tympanic membrane, ear canal and external ear normal. There is no impacted cerumen.     Left Ear: Tympanic membrane, ear canal and external ear normal. There is no impacted cerumen.     Nose: Nose normal. No congestion or rhinorrhea.     Mouth/Throat:     Mouth: Mucous membranes are moist.     Pharynx: Oropharynx is clear. No oropharyngeal exudate.  Eyes:     General: No scleral icterus.       Right eye: No discharge.        Left eye: No discharge.     Conjunctiva/sclera: Conjunctivae normal.     Pupils: Pupils are equal, round, and reactive to light.  Neck:     Musculoskeletal: Normal range of motion and neck supple.     Thyroid: No thyromegaly.     Vascular: No JVD.     Trachea: No tracheal deviation.  Cardiovascular:     Rate and Rhythm: Normal rate and regular rhythm.     Heart sounds: Normal heart sounds. No murmur. No friction rub. No gallop.    Pulmonary:     Effort: Pulmonary effort is normal. No respiratory distress.     Breath sounds: Normal breath sounds. No stridor. No wheezing or rales.  Chest:     Chest wall: No tenderness.  Abdominal:     General: Bowel sounds are normal. There is no distension.     Palpations: Abdomen is  soft. There is no mass.     Tenderness: There is generalized abdominal tenderness and tenderness in the right lower quadrant and left lower quadrant. There is no right CVA tenderness, left CVA tenderness, guarding or rebound. Negative signs include Murphy's sign, Rovsing's sign, McBurney's sign, psoas sign and obturator sign.     Hernia: No hernia is present.  Musculoskeletal: Normal range of motion.        General: Tenderness (He has tenderness over all the muscles upper and lower extremities, neck and lower back.) present. No deformity.  Lymphadenopathy:     Cervical: No cervical adenopathy.  Skin:    General: Skin is warm and dry.     Capillary Refill: Capillary refill takes less than 2 seconds.     Coloration: Skin is not jaundiced or pale.     Findings: No bruising, erythema, lesion or rash.  Neurological:     General: No focal deficit present.     Mental Status: He is alert and oriented to person, place, and time.     Cranial Nerves: No cranial nerve deficit.     Sensory: No sensory deficit.     Motor: No weakness or abnormal muscle tone.     Coordination: Coordination normal.     Gait: Gait normal.     Deep Tendon Reflexes: Reflexes are normal and symmetric. Reflexes normal.  Psychiatric:        Mood and Affect: Mood normal.        Behavior: Behavior normal.        Thought Content: Thought content normal.        Judgment: Judgment normal.       Assessment & Plan:  1. Routine general medical examination at a health care facility  - CBC with Differential/Platelet - Comprehensive metabolic panel - Hemoglobin A1c - Lipid panel - TSH - HIV Antibody (routine testing w rflx) - Urine  cytology ancillary only  2. Screen for STD (sexually transmitted disease)  - HIV Antibody (routine testing w rflx) - Urine cytology ancillary only  3. Need for influenza vaccination  - Flu Vaccine QUAD 6+ mos PF IM (Fluarix Quad PF)  4. Muscular pain -Follow-up with rheumatology as directed - CBC with Differential/Platelet - Comprehensive metabolic panel - Hemoglobin A1c - Lipid panel - TSH  5. Generalized abdominal pain -Follow-up with rheumatology and gastroenterology as directed - CBC with Differential/Platelet - Comprehensive metabolic panel - Hemoglobin A1c - Lipid panel - TSH   [name], NP

## 2019-08-07 LAB — HIV ANTIBODY (ROUTINE TESTING W REFLEX): HIV 1&2 Ab, 4th Generation: NONREACTIVE

## 2019-08-08 LAB — URINE CYTOLOGY ANCILLARY ONLY
Chlamydia: NEGATIVE
Comment: NEGATIVE
Comment: NEGATIVE
Comment: NORMAL
Neisseria Gonorrhea: NEGATIVE
Trichomonas: NEGATIVE

## 2019-08-18 ENCOUNTER — Ambulatory Visit: Payer: BC Managed Care – PPO | Admitting: Neurology

## 2019-08-18 ENCOUNTER — Encounter: Payer: Self-pay | Admitting: Neurology

## 2019-08-18 ENCOUNTER — Telehealth: Payer: Self-pay | Admitting: *Deleted

## 2019-08-18 NOTE — Telephone Encounter (Signed)
No showed new patient appointment. 

## 2019-09-09 ENCOUNTER — Other Ambulatory Visit: Payer: Self-pay | Admitting: Gastroenterology

## 2019-09-09 DIAGNOSIS — R1031 Right lower quadrant pain: Secondary | ICD-10-CM

## 2019-09-11 ENCOUNTER — Ambulatory Visit: Payer: BC Managed Care – PPO | Admitting: Neurology

## 2019-09-11 ENCOUNTER — Encounter: Payer: Self-pay | Admitting: Neurology

## 2019-09-11 ENCOUNTER — Other Ambulatory Visit: Payer: Self-pay

## 2019-09-11 VITALS — BP 112/69 | HR 77 | Temp 97.2°F | Ht 71.0 in | Wt 199.0 lb

## 2019-09-11 DIAGNOSIS — M62838 Other muscle spasm: Secondary | ICD-10-CM | POA: Diagnosis not present

## 2019-09-11 DIAGNOSIS — M545 Low back pain, unspecified: Secondary | ICD-10-CM

## 2019-09-11 MED ORDER — DULOXETINE HCL 60 MG PO CPEP: 60.0000 mg | ORAL_CAPSULE | Freq: Every day | ORAL | 12 refills | Status: DC

## 2019-09-11 NOTE — Progress Notes (Signed)
PATIENT: Marcus Bullock DOB: 03/09/1986  Chief Complaint  Patient presents with  . Neck and Back Pain    He has neck pain radiating down right arm to forearm.  He has low back pain that occasionally goes down both legs.  More recently he has developed an area of pain in his thoracic area. At times, he has tingling and prickly sensations.  He has been evaluated by PCP, chiropractor, rheumatology, gastroenterology and orthopaedics.  He has tried PT twice with limited relief.  He has tried muscle relaxers but they make him sleepy.    [name] PCP    [name], [name], NP     HISTORICAL  Marcus Bullock is a 33 year old male, seen in request by his primary care nurse practitioner [name] for evaluation of neck pain, upper back pain muscle spasm, initial evaluation was on September 11, 2019.  I have reviewed and summarized the referring note from the referring physician.  He used to workout regularly, around January 2020, after strenuous work workout routine of boxing, including multiple body twisting, punching a heavy object, he noticed low back pain, back muscle pain, achiness, muscle spasm, he reported worsening symptoms with weight lifting, workout, he has stopped his workout routine, over the past few months, he was seen by different specialists, physical therapy, chiropractor, rheumatologist, gastroenterologist, without clear etiology found, he also tried physical therapy, acupuncture, muscle relaxant, NSAIDs, antianxiety medication, massage therapy without persistently help with symptoms  He continues to be bothered by his upper back muscle tightness, especially at the left thoracic paraspinal area, constant lower back functioning deep achy pain, 5 out of 10, he denies gait abnormality, no bowel and bladder incontinence  Lab in Nov 2020 showed negative HIV, TSH, Lipid panel, CMP, CBC.  I have personally reviewed MRI of cervical spine that was normal.  MRI of the thoracic spine,  multilevel degenerative changes, but there was no evidence of canal stenosis.   REVIEW OF SYSTEMS: Full 14 system review of systems performed and notable only for as above All other review of systems were negative.  ALLERGIES: Allergies  Allergen Reactions  . Bee Venom Swelling    HOME MEDICATIONS: Current Outpatient Medications  Medication Sig Dispense Refill  . naproxen sodium (ALEVE) 220 MG tablet Take 220 mg by mouth as needed.      No current facility-administered medications for this visit.    PAST MEDICAL HISTORY: Past Medical History:  Diagnosis Date  . Back pain   . Left ankle sprain   . Neck pain   . 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    PAST SURGICAL HISTORY: History reviewed. No pertinent surgical history.  FAMILY HISTORY: Family History  Problem Relation Age of Onset  . Alcohol abuse Father   . Ulcers Father   . Heart disease Maternal Grandmother   . Cancer Paternal Grandmother        breast  . Thyroid disease Mother     SOCIAL HISTORY: Social History   Socioeconomic History  . Marital status: Single    Spouse name: Not on file  . Number of children: 0  . Years of education: college  . Highest education level: Bachelor's degree (e.g., BA, AB, BS)  Occupational History  . Occupation: 5th grade teacher  Tobacco Use  . Smoking status: Never Smoker  . Smokeless tobacco: Never Used  Substance and Sexual Activity  . Alcohol use: Yes    Comment: under  safe drinking levels  . Drug use: Not Currently  . Sexual activity: Yes    Comment: male partner  Other Topics Concern  . 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   Right-handed.   One cup tea per day.      Social Determinants of Health   Financial Resource Strain:   . Difficulty of Paying Living Expenses: Not on file  Food Insecurity:   . Worried About Community education officerunning  Out of Food in the Last Year: Not on file  . Ran Out of Food in the Last Year: Not on file  Transportation Needs:   . Lack of Transportation (Medical): Not on file  . Lack of Transportation (Non-Medical): Not on file  Physical Activity:   . Days of Exercise per Week: Not on file  . Minutes of Exercise per Session: Not on file  Stress:   . Feeling of Stress : Not on file  Social Connections:   . Frequency of Communication with Friends and Family: Not on file  . Frequency of Social Gatherings with Friends and Family: Not on file  . Attends Religious Services: Not on file  . Active Member of Clubs or Organizations: Not on file  . Attends BankerClub or Organization Meetings: Not on file  . Marital Status: Not on file  Intimate Partner Violence:   . Fear of Current or Ex-Partner: Not on file  . Emotionally Abused: Not on file  . Physically Abused: Not on file  . Sexually Abused: Not on file     PHYSICAL EXAM   Vitals:   09/11/19 0841  BP: 112/69  Pulse: 77  Temp: (!) 97.2 F (36.2 C)  Weight: 199 lb (90.3 kg)  Height: 5\' 11"  (1.803 m)    Not recorded      Body mass index is 27.75 kg/m.  PHYSICAL EXAMNIATION:  Gen: NAD, conversant, well nourised, well groomed                     Cardiovascular: Regular rate rhythm, no peripheral edema, warm, nontender. Eyes: Conjunctivae clear without exudates or hemorrhage Neck: Supple, no carotid bruits. Pulmonary: Clear to auscultation bilaterally   NEUROLOGICAL EXAM:  MENTAL STATUS: Speech:    Speech is normal; fluent and spontaneous with normal comprehension.  Cognition:     Orientation to time, place and person     Normal recent and remote memory     Normal Attention span and concentration     Normal Language, naming, repeating,spontaneous speech     Fund of knowledge   CRANIAL NERVES: CN II: Visual fields are full to confrontation.  Pupils are round equal and briskly reactive to light. CN III, IV, VI: extraocular movement  are normal. No ptosis. CN V: Facial sensation CN VII: Face is symmetric with normal eye closure CN VIII: Hearing is normal to causal conversation. CN IX, X: Phonation is normal. CN XI: Head turning and shoulder shrug are intact  MOTOR: There is no pronator drift of out-stretched arms. Muscle bulk and tone are normal. Muscle strength is normal.  REFLEXES: Reflexes are 2+ and symmetric at the biceps, triceps, knees, and ankles. Plantar responses are flexor.  SENSORY: Intact to light touch, pinprick and vibratory sensation are intact in fingers and toes.  COORDINATION: There is no trunk or limb dysmetria noted.  GAIT/STANCE: Posture is  normal. Gait is steady with normal steps, base, arm swing, and turning. Heel and toe walking are normal. Tandem gait is normal.  Romberg is absent.   DIAGNOSTIC DATA (LABS, IMAGING, TESTING) - I reviewed patient records, labs, notes, testing and imaging myself where available.   ASSESSMENT AND PLAN  KOLSTON LACOUNT is a 33 y.o. male   Low back pain, upper back paraspinal muscle spasm  Most consistent with musculoskeletal etiology  Normal neurological examinations  Normal MRI of cervical spine, mild degenerative changes on MRI of thoracic spine, there is no thoracic canal stenosis  I have suggested him heating pad, back stretching exercise  Cymbalta 60 mg daily  Levert Feinstein, M.D. Ph.D.  [facility] [address] [place], [place] [address] Ph: [phone] Fax: [phone]  CC: Shirline Frees, NP

## 2019-09-16 ENCOUNTER — Other Ambulatory Visit: Payer: BC Managed Care – PPO

## 2019-09-30 ENCOUNTER — Other Ambulatory Visit: Payer: Self-pay | Admitting: Physical Medicine and Rehabilitation

## 2019-09-30 DIAGNOSIS — M5134 Other intervertebral disc degeneration, thoracic region: Secondary | ICD-10-CM

## 2019-09-30 DIAGNOSIS — M5124 Other intervertebral disc displacement, thoracic region: Secondary | ICD-10-CM

## 2019-10-13 ENCOUNTER — Other Ambulatory Visit: Payer: Self-pay

## 2019-10-13 ENCOUNTER — Ambulatory Visit
Admission: RE | Admit: 2019-10-13 | Discharge: 2019-10-13 | Disposition: A | Discharge status: Home / Self Care | Payer: BC Managed Care – PPO | Source: Ambulatory Visit | Attending: Physical Medicine and Rehabilitation | Admitting: Physical Medicine and Rehabilitation

## 2019-10-13 DIAGNOSIS — M5134 Other intervertebral disc degeneration, thoracic region: Secondary | ICD-10-CM

## 2019-10-13 DIAGNOSIS — M5124 Other intervertebral disc displacement, thoracic region: Secondary | ICD-10-CM

## 2019-10-13 MED ORDER — IOPAMIDOL (ISOVUE-M 300) INJECTION 61%
1.0000 mL | Freq: Once | INTRAMUSCULAR | Status: AC | PRN
  Administered 2019-10-13: 15:00:00 1 mL via EPIDURAL

## 2019-10-13 MED ORDER — TRIAMCINOLONE ACETONIDE 40 MG/ML IJ SUSP (RADIOLOGY)
60.0000 mg | Freq: Once | INTRAMUSCULAR | Status: AC
  Administered 2019-10-13: 60 mg via EPIDURAL

## 2019-10-13 NOTE — Discharge Instructions (Signed)

## 2019-10-14 ENCOUNTER — Ambulatory Visit: Payer: BC Managed Care – PPO | Admitting: Neurology

## 2019-10-22 ENCOUNTER — Ambulatory Visit
Admission: RE | Admit: 2019-10-22 | Discharge: 2019-10-22 | Disposition: A | Discharge status: Home / Self Care | Payer: BC Managed Care – PPO | Source: Ambulatory Visit | Attending: Gastroenterology | Admitting: Gastroenterology

## 2019-10-22 ENCOUNTER — Other Ambulatory Visit: Payer: Self-pay

## 2019-10-22 DIAGNOSIS — R1031 Right lower quadrant pain: Secondary | ICD-10-CM

## 2019-10-22 MED ORDER — IOPAMIDOL (ISOVUE-300) INJECTION 61%
100.0000 mL | Freq: Once | INTRAVENOUS | Status: AC | PRN
  Administered 2019-10-22: 100 mL via INTRAVENOUS

## 2019-12-15 ENCOUNTER — Encounter: Payer: Self-pay | Admitting: Adult Health

## 2019-12-31 ENCOUNTER — Encounter (HOSPITAL_COMMUNITY): Payer: Self-pay | Admitting: Emergency Medicine

## 2019-12-31 ENCOUNTER — Other Ambulatory Visit: Payer: Self-pay

## 2019-12-31 ENCOUNTER — Emergency Department (HOSPITAL_COMMUNITY)
Admission: EM | Admit: 2019-12-31 | Discharge: 2020-01-01 | Disposition: A | Discharge status: Home / Self Care | Payer: BC Managed Care – PPO | Attending: Emergency Medicine | Admitting: Emergency Medicine

## 2019-12-31 DIAGNOSIS — R109 Unspecified abdominal pain: Secondary | ICD-10-CM | POA: Insufficient documentation

## 2019-12-31 DIAGNOSIS — M545 Low back pain: Secondary | ICD-10-CM | POA: Insufficient documentation

## 2019-12-31 DIAGNOSIS — Z79899 Other long term (current) drug therapy: Secondary | ICD-10-CM | POA: Diagnosis not present

## 2019-12-31 DIAGNOSIS — G8929 Other chronic pain: Secondary | ICD-10-CM | POA: Insufficient documentation

## 2019-12-31 LAB — URINALYSIS, ROUTINE W REFLEX MICROSCOPIC
Bilirubin Urine: NEGATIVE
Glucose, UA: NEGATIVE mg/dL
Hgb urine dipstick: NEGATIVE
Ketones, ur: NEGATIVE mg/dL
Leukocytes,Ua: NEGATIVE
Nitrite: NEGATIVE
Protein, ur: NEGATIVE mg/dL
Specific Gravity, Urine: 1.012 (ref 1.005–1.030)
pH: 6 (ref 5.0–8.0)

## 2019-12-31 LAB — CBC
HCT: 47.3 % (ref 39.0–52.0)
Hemoglobin: 15 g/dL (ref 13.0–17.0)
MCH: 29.2 pg (ref 26.0–34.0)
MCHC: 31.7 g/dL (ref 30.0–36.0)
MCV: 92.2 fL (ref 80.0–100.0)
Platelets: 255 10*3/uL (ref 150–400)
RBC: 5.13 MIL/uL (ref 4.22–5.81)
RDW: 11.2 % — ABNORMAL LOW (ref 11.5–15.5)
WBC: 8 10*3/uL (ref 4.0–10.5)
nRBC: 0 % (ref 0.0–0.2)

## 2019-12-31 LAB — COMPREHENSIVE METABOLIC PANEL
ALT: 31 U/L (ref 0–44)
AST: 30 U/L (ref 15–41)
Albumin: 4.1 g/dL (ref 3.5–5.0)
Alkaline Phosphatase: 44 U/L (ref 38–126)
Anion gap: 8 (ref 5–15)
BUN: 10 mg/dL (ref 6–20)
CO2: 26 mmol/L (ref 22–32)
Calcium: 9.1 mg/dL (ref 8.9–10.3)
Chloride: 103 mmol/L (ref 98–111)
Creatinine, Ser: 1.04 mg/dL (ref 0.61–1.24)
GFR calc Af Amer: >60 mL/min (ref >60)
GFR calc non Af Amer: >60 mL/min (ref >60)
Glucose, Bld: 106 mg/dL — ABNORMAL HIGH (ref 70–99)
Potassium: 3.9 mmol/L (ref 3.5–5.1)
Sodium: 137 mmol/L (ref 135–145)
Total Bilirubin: 0.5 mg/dL (ref 0.3–1.2)
Total Protein: 7.3 g/dL (ref 6.5–8.1)

## 2019-12-31 LAB — LIPASE, BLOOD: Lipase: 24 U/L (ref 11–51)

## 2019-12-31 MED ORDER — SODIUM CHLORIDE 0.9% FLUSH: 3.0000 mL | Freq: Once | INTRAVENOUS | Status: DC

## 2019-12-31 NOTE — ED Triage Notes (Signed)
Pt in POV. Reports lower abd pain, flank pain X several months. Pt in NAD.

## 2019-12-31 NOTE — ED Notes (Signed)
Pt reports pain in right flank. Bowel souns normal and Pt is non-guarding except for right lateral side. Denies Nausea/vomit. Pt had bowel movement this evening without complications. No problems with urination.

## 2020-01-01 MED ORDER — IBUPROFEN 800 MG PO TABS: 800.0000 mg | ORAL_TABLET | Freq: Three times a day (TID) | ORAL | 0 refills | Status: DC

## 2020-01-01 MED ORDER — METHOCARBAMOL 500 MG PO TABS: 500.0000 mg | ORAL_TABLET | Freq: Two times a day (BID) | ORAL | 0 refills | Status: DC

## 2020-01-01 NOTE — ED Provider Notes (Signed)
[facility] EMERGENCY DEPARTMENT Provider Note   CSN: [id number] Arrival date & time: 12/31/19  2135     History Chief Complaint  Patient presents with  . Abdominal Pain  . Flank Pain    Marcus Bullock is a 34 y.o. male.  HPI 34 year old male with a history of chronic low back pain presents to the ER with right-sided low back pain that radiates into his abdomen x1 week.  Patient refers that he has been dealing with low back pain for almost 2 years, he has seen multiple providers including orthopedics, GI, chiropractor, and acupuncturist and they have not been able to give him a direct diagnosis of his pain.  He refers that he has had a flareup of his low back pain x1 week, describes the pain as dull and aching,  states it feels more muscular than radicular.  He was given a diagnosis of fibromyalgia and has a prescription for Cymbalta which he took earlier today with no relief.  He does not take anything else for his pain as he states he "does not like medications".  He states his pain is worse with sitting, relieved with standing.  He came to the ER today because he was going on a trip to [place] and just wanted to make sure that there was nothing more serious.  He has had multiple scans from different providers, including a CT abdomen/ pelvis which showed no kidney stones, AAA or any other causes of his low back pain/ urinary symptoms on 10/22/19, MRI of thoracic and cervical spine in October 2020. T-spine MRI shows large disc protrusion at T8-9, indenting the ventral subarachnoid space but not compressing the cord. Patient states he received an epidural back in January with little relief.  He denies fever, chills, urinary incontinence/urgency/dysuria/hematuria, loss of bowel and bladder, saddle anesthesia, foot drop, focal abdominal pain, nausea, vomiting, syncope, chest pain, shortness of breath.     Past Medical History:  Diagnosis Date  . Back pain   . Left  ankle sprain   . Neck pain   . 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 Muscle spasm 09/11/2019  . Bilateral low back pain without sciatica 09/11/2019  . Ankle impingement syndrome 08/11/2018  . Breast skin changes 01/18/2017  . Seasonal allergic rhinitis 01/18/2017  . Acute low back pain 01/18/2017    History reviewed. No pertinent surgical history.     Family History  Problem Relation Age of Onset  . Alcohol abuse Father   . Ulcers Father   . Heart disease Maternal Grandmother   . Cancer Paternal Grandmother        breast  . Thyroid disease Mother     Social History   Tobacco Use  . Smoking status: Never Smoker  . Smokeless tobacco: Never Used  Substance Use Topics  . Alcohol use: Yes    Comment: Socially   . Drug use: Not Currently    Home Medications Prior to Admission medications   Medication Sig Start Date End Date Taking? Authorizing Provider  DULoxetine (CYMBALTA) 60 MG capsule Take 1 capsule (60 mg total) by mouth daily. 09/11/19   Levert Feinstein, MD  ibuprofen (ADVIL) 800 MG tablet Take 1 tablet (800 mg total) by mouth 3 (three) times daily. 01/01/20   Mare Ferrari, PA-C  methocarbamol (ROBAXIN) 500 MG tablet Take 1 tablet (500 mg  total) by mouth 2 (two) times daily. 01/01/20   Mare Ferrari, PA-C  naproxen sodium (ALEVE) 220 MG tablet Take 220 mg by mouth as needed.     [provider]    Allergies    Bee venom  Review of Systems   Review of Systems  Constitutional: Negative for chills, fatigue and fever.  Eyes: Negative for pain and visual disturbance.  Respiratory: Negative for cough and shortness of breath.   Cardiovascular: Negative for chest pain and palpitations.  Gastrointestinal: Negative for abdominal pain, constipation, diarrhea, nausea and vomiting.  Genitourinary: Positive for flank pain. Negative for difficulty urinating, discharge,  dysuria, hematuria, penile pain and penile swelling.  Musculoskeletal: Positive for back pain. Negative for arthralgias, myalgias and neck pain.  Skin: Negative for color change and rash.  Neurological: Negative for dizziness, syncope, weakness and headaches.  Psychiatric/Behavioral: Negative for confusion.  All other systems reviewed and are negative.   Physical Exam Updated Vital Signs BP (!) 111/59 (BP Location: Right Arm)   Pulse 63   Temp 98.6 F (37 C) (Oral)   Resp 16   Ht 5\' 11"  (1.803 m)   Wt 93 kg   SpO2 100%   BMI 28.59 kg/m   Physical Exam Abdominal:     General: Abdomen is flat. There is no distension.     Palpations: Abdomen is soft.     Tenderness: There is abdominal tenderness in the right lower quadrant.     Comments: Generalized right lower quadrant abdominal discomfort on palpation which terminates around the mid clavicular line.Negative McBurney's, psoas.  Right CVA tenderness.  No appreciable rashes.  Musculoskeletal:     Lumbar back: Tenderness present. No swelling, deformity, lacerations or bony tenderness.     Comments: Right paraspinal muscle tenderness.  No bony tenderness/lumbar spine tenderness     ED Results / Procedures / Treatments   Labs (all labs ordered are listed, but only abnormal results are displayed) Labs Reviewed  COMPREHENSIVE METABOLIC PANEL - Abnormal; Notable for the following components:      Result Value   Glucose, Bld 106 (*)    All other components within normal limits  CBC - Abnormal; Notable for the following components:   RDW 11.2 (*)    All other components within normal limits  LIPASE, BLOOD  URINALYSIS, ROUTINE W REFLEX MICROSCOPIC    EKG None  Radiology No results found.  Procedures Procedures (including critical care time)  Medications Ordered in ED Medications  sodium chloride flush (NS) 0.9 % injection 3 mL (has no administration in time range)    ED Course  I have reviewed the triage vital signs  and the nursing notes.  Pertinent labs & imaging results that were available during my care of the patient were reviewed by me and considered in my medical decision making (see chart for details).    MDM Rules/Calculators/A&P                     34 year old male presents with right lower back pain. Patient afebrile, vitals unremarkable.  Well-appearing, generalized right-sided paraspinal low back pain and TTP to right lower abdomen terminated at midclavicular line. Denies trauma to ribs. No focal tenderness, negative McBurney's and psoas. He does have right flank tenderness but CT abdomen 2 months ago was negative for stones and he denies dysuria/ hematuria. Suspicion for stones low. CBC without leukocytosis, CMP without significant electrolyte abnormalities or worsening kidney function.  Lipase normal.  Urinalysis normal.  No weakness, foot drop, or other noticeable neuro deficits on exam.  5 out of 5 strength in lower extremities.  Suspicion for cauda equina/spinal abscess low. No rashes suggestive of shingles. At this point in ED course, the patient has been appropriately screened and is stable for discharge.  As he has not tried to take anything for his pain today, will prescribe him 800 mg ibuprofen and Robaxin since the patient states that he does have Flexeril but it makes him very drowsy.  I instructed the patient to not take the 2 muscle relaxers together.  Encouraged follow-up with his orthopedics for possible repeat of L-spine MRI.  The patient voices understanding is agreeable with this plan.  Return precautions given. Final Clinical Impression(s) / ED Diagnoses Final diagnoses:  Chronic right-sided low back pain without sciatica    Rx / DC Orders ED Discharge Orders         Ordered    ibuprofen (ADVIL) 800 MG tablet  3 times daily     01/01/20 0047    methocarbamol (ROBAXIN) 500 MG tablet  2 times daily     01/01/20 0047           Garald Balding, PA-C 01/01/20 0115      Mesner, Corene Cornea, MD 01/01/20 [id number]

## 2020-01-01 NOTE — Discharge Instructions (Signed)
You were seen in the ER for right  lower back pain.  Given that your lab work is normal, you recently had MRI of your back and CT scan of your abdomen, it is unlikely that you have a life-threatening cause of back pain.  I will prescribe you some prescription strength Ibuprofen and Robaxin since you have stated that you do not like how Flexeril makes you feel. Make sure to not take the two muscle relaxers together.  Please take as directed for symptomatic management. Please follow-up with your orthopedic doctor, as a updated MRI of your L-spine may be indicated.  Return to the ER if your symptoms worsen, or you develop sudden inability to hold your bowel or bladder, or numbness in your groin or gluteal area, or sudden footdrop .

## 2020-01-16 ENCOUNTER — Ambulatory Visit (INDEPENDENT_AMBULATORY_CARE_PROVIDER_SITE_OTHER): Payer: BC Managed Care – PPO | Admitting: Internal Medicine

## 2020-01-16 ENCOUNTER — Encounter: Payer: Self-pay | Admitting: Internal Medicine

## 2020-01-16 ENCOUNTER — Ambulatory Visit (INDEPENDENT_AMBULATORY_CARE_PROVIDER_SITE_OTHER): Payer: BC Managed Care – PPO

## 2020-01-16 ENCOUNTER — Telehealth: Payer: BC Managed Care – PPO | Admitting: Family Medicine

## 2020-01-16 ENCOUNTER — Telehealth: Payer: Self-pay

## 2020-01-16 ENCOUNTER — Encounter: Payer: Self-pay | Admitting: Family Medicine

## 2020-01-16 ENCOUNTER — Other Ambulatory Visit: Payer: Self-pay

## 2020-01-16 VITALS — BP 152/90 | HR 88 | Temp 98.4°F | Ht 71.0 in | Wt 197.4 lb

## 2020-01-16 DIAGNOSIS — R079 Chest pain, unspecified: Secondary | ICD-10-CM

## 2020-01-16 DIAGNOSIS — M546 Pain in thoracic spine: Secondary | ICD-10-CM

## 2020-01-16 DIAGNOSIS — R030 Elevated blood-pressure reading, without diagnosis of hypertension: Secondary | ICD-10-CM

## 2020-01-16 MED ORDER — GABAPENTIN 300 MG PO CAPS: 300.0000 mg | ORAL_CAPSULE | Freq: Two times a day (BID) | ORAL | 1 refills | Status: DC

## 2020-01-16 MED ORDER — PREDNISONE 20 MG PO TABS: 20.0000 mg | ORAL_TABLET | Freq: Every day | ORAL | 0 refills | Status: DC

## 2020-01-16 NOTE — Progress Notes (Signed)
This visit occurred during the SARS-CoV-2 public health emergency.  Safety protocols were in place, including screening questions prior to the visit, additional usage of staff PPE, and extensive cleaning of exam room while observing appropriate contact time as indicated for disinfecting solutions.    Chief Complaint  Patient presents with  . Abdominal Pain    left side radiating into left mid back/spinal area, severe pain area  . Back Pain    left lower shoulder close to spine area, severe pain    HPI: Marcus Bullock 34 y.o. come in for acute sda   fro poss ruabd pain and left pain different in past week   PCP appt NA  Was in ed 3 31 for lbp  Has on going lbp  Baseline  This week having  More isolated  Pain upper left  perscapular and then anterior left chest pain uppse and lateral   And to the back and now   Spasm pain abdomin bad right  Now on left.  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  Ibu for day  Continuous.  Has to lay down  ? If breathing click in chest when heavy breathing.  No cough . Best  when layi g  on right ide lateral   .  Does  Yoga used to work out a lot but not since  Ms causes  No injury  No cough sob  ROS: See pertinent positives and negatives per HPI. No fever vomiting   But pain is difficult   Doesn't have a bp monitor at home   Past Medical History:  Diagnosis Date  . Back pain   . Left ankle sprain   . Neck pain   . 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 Alcohol abuse Father   . Ulcers Father   . Heart disease Maternal Grandmother   . Cancer Paternal Grandmother        breast  . Thyroid disease Mother     Social History   Socioeconomic History  . Marital status: Single    Spouse name: Not on file  . Number of children: 0  . Years of education: college  . Highest education level: Bachelor's degree (e.g., BA, AB, BS)  Occupational  History  . Occupation: 5th grade teacher  Tobacco Use  . Smoking status: Never Smoker  . Smokeless tobacco: Never Used  Substance and Sexual Activity  . Alcohol use: Yes    Comment: Socially   . Drug use: Not Currently  . Sexual activity: Yes    Comment: male partner  Other Topics Concern  . Not on file  Social History Narrative   Tourist information centre manager    Not married    No kids      Likes to work out and bowl.   Right-handed.   One cup tea per day.      Social Determinants of Health   Financial Resource Strain:   . Difficulty of Paying Living Expenses:   Food Insecurity:   . Worried About Programme researcher, broadcasting/film/video in the Last Year:   . Barista in the Last Year:   Transportation Needs:   . Freight forwarder (Medical):   [name] Lack of Transportation (Non-Medical):   Physical Activity:   . Days of Exercise per Week:   . Minutes  of Exercise per Session:   Stress:   . Feeling of Stress :   Social Connections:   . Frequency of Communication with Friends and Family:   . Frequency of Social Gatherings with Friends and Family:   . Attends Religious Services:   . Active Member of Clubs or Organizations:   . Attends Archivist Meetings:   [name] Marital Status:     Outpatient Medications Prior to Visit  Medication Sig Dispense Refill  . methocarbamol (ROBAXIN) 500 MG tablet Take 1 tablet (500 mg total) by mouth 2 (two) times daily. 20 tablet 0  . naproxen sodium (ALEVE) 220 MG tablet Take 220 mg by mouth as needed.     . DULoxetine (CYMBALTA) 60 MG capsule Take 1 capsule (60 mg total) by mouth daily. (Patient not taking: Reported on 01/16/2020) 30 capsule 12  . ibuprofen (ADVIL) 800 MG tablet Take 1 tablet (800 mg total) by mouth 3 (three) times daily. (Patient not taking: Reported on 01/16/2020) 21 tablet 0   No facility-administered medications prior to visit.     EXAM:  BP (!) 152/90   Pulse 88   Temp 98.4 F (36.9 C) (Temporal)   Ht 5\' 11"  (1.803  m)   Wt 197 lb 6.4 oz (89.5 kg)   SpO2 98%   BMI 27.53 kg/m   Body mass index is 27.53 kg/m.  GENERAL: vitals reviewed and listed above, alert, oriented, appears well hydrated and in no acute distress HEENT: atraumatic, conjunctiva  clear, no obvious abnormalities on inspection of external nose and ears OP : masked  NECK: no obvious masses on inspection palpation  LUNGS: clear to auscultation bilaterally, no wheezes, rales or rhonchi, good air movement CV: HRRR, no clubbing cyanosis or  peripheral edema nl cap refill  Point tenderness lower thoracic ujpper  lumbar area    Left periscapular  Area  And left ant chest upper rubs at times   MS: moves all extremities without noticeable focal  Abnormality nl muscle mass  PSYCH: pleasant and cooperative, no obvious depression or anxiety Lab Results  Component Value Date   WBC 8.0 12/31/2019   HGB 15.0 12/31/2019   HCT 47.3 12/31/2019   PLT 255 12/31/2019   GLUCOSE 106 (H) 12/31/2019   CHOL 146 08/06/2019   TRIG 115.0 08/06/2019   HDL 48.90 08/06/2019   LDLCALC 75 08/06/2019   ALT 31 12/31/2019   AST 30 12/31/2019   NA 137 12/31/2019   K 3.9 12/31/2019   CL 103 12/31/2019   CREATININE 1.04 12/31/2019   BUN 10 12/31/2019   CO2 26 12/31/2019   TSH 1.82 08/06/2019   HGBA1C 4.4 (L) 08/06/2019   BP Readings from Last 3 Encounters:  01/16/20 (!) 152/90  01/01/20 (!) 111/59  10/13/19 133/86  IEKG no acute findings  No change  Early repolarization elevatoin   MPRESSION: MRI cervical spine: Normal  MRI thoracic spine: 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 This is largest at T8-9, indenting the ventral subarachnoid space but not compressing the cord. No foraminal extension or encroachment seen at any level. Findings could possibly be associated with thoracic region back pain.   Electronically Signed   By: Nelson Chimes M.D.   On: 07/17/2019 11:34  FINDINGS: Lower chest:  Unremarkable.  Hepatobiliary: Tiny hypodensities in the inferior right liver are too small to characterize, measuring up to only about 4 mm diameter, but are likely benign and may represent tiny cysts. There is no  evidence for gallstones, gallbladder wall thickening, or pericholecystic fluid. No intrahepatic or extrahepatic biliary dilation.  Pancreas: No focal mass lesion. No dilatation of the main duct. No intraparenchymal cyst. No peripancreatic edema.  Spleen: No splenomegaly. No focal mass lesion.  Adrenals/Urinary Tract: No adrenal nodule or mass. Kidneys unremarkable. No evidence for hydroureter. The urinary bladder appears normal for the degree of distention.  Stomach/Bowel: Stomach is unremarkable. No gastric wall thickening. No evidence of outlet obstruction. Duodenum is normally positioned as is the ligament of Treitz. No small bowel wall thickening. No small bowel dilatation. The terminal ileum is normal. The appendix is normal. No gross colonic mass. No colonic wall thickening.  Vascular/Lymphatic: No abdominal aortic aneurysm. No abdominal aortic atherosclerotic calcification. Upper normal gastrohepatic ligament lymph node evident (16/2). No retroperitoneal lymphadenopathy. No pelvic sidewall lymphadenopathy.  Reproductive: The prostate gland and seminal vesicles are unremarkable.  Other: No intraperitoneal free fluid.  Musculoskeletal: No worrisome lytic or sclerotic osseous abnormality.  IMPRESSION: Unremarkable CT evaluation of the abdomen and pelvis. Specifically, no findings to explain the patient's history of right lower quadrant pain and urinary urgency.   Electronically Signed   By: Kennith Center M.D.   On: 10/22/2019 13:05  ASSESSMENT AND PLAN:  Discussed the following assessment and plan:  Left-sided chest pain - Plan: DG Chest 2 View, DG Chest 2 View  Thoracic spine pain - Plan: DG Chest 2 View, DG Chest 2 View  Elevated  blood pressure reading - plan fu get own monitor  get to goal and discuss with pcp  strong;lu suspect radicular pain   Based on prev imagine and location although atypical  In the settting of many other pains that seem to be MS or neuro   in nature  Caution sedation  abd ct jan reviewed   bp is up today get monitor and follow out of hospital  -Patient advised to return or notify health care team  if  new concerns arise.  Patient Instructions  Suspicious for  Disc in the spine and nerve pain from this .   prednisone trial   And gabapentin  in short rum and then    Fu with [facility] .   consider sports medicine .    Neta Mends. Charlsey Moragne M.D.

## 2020-01-16 NOTE — Telephone Encounter (Signed)
Left pt a detailed message advising that Dr Salomon Fick wants him to come to the office for a face to face visit with Dr Fabian Sharp who has approved for pt to come in at 3 pm,  Not able to get pt on his phone.

## 2020-01-16 NOTE — Progress Notes (Signed)
Good news  chest x ray is normal  No surprises

## 2020-01-16 NOTE — Patient Instructions (Addendum)
Suspicious for  Disc in the spine and nerve pain from this .   prednisone trial   And gabapentin  in short rum and then    Fu with [facility] .   consider sports medicine .

## 2020-01-23 NOTE — Progress Notes (Signed)
Error.  Pt seen in person for visit.  Initially scheduled as a virtual visit.

## 2020-01-30 ENCOUNTER — Other Ambulatory Visit: Payer: Self-pay

## 2020-01-30 ENCOUNTER — Encounter: Payer: Self-pay | Admitting: Adult Health

## 2020-01-30 ENCOUNTER — Ambulatory Visit (INDEPENDENT_AMBULATORY_CARE_PROVIDER_SITE_OTHER): Payer: BC Managed Care – PPO | Admitting: Adult Health

## 2020-01-30 VITALS — BP 120/78 | Temp 97.8°F | Wt 201.0 lb

## 2020-01-30 DIAGNOSIS — M546 Pain in thoracic spine: Secondary | ICD-10-CM | POA: Diagnosis not present

## 2020-01-30 DIAGNOSIS — M5412 Radiculopathy, cervical region: Secondary | ICD-10-CM

## 2020-01-30 NOTE — Progress Notes (Addendum)
Subjective:    Patient ID: Marcus Bullock, male    DOB: 12-21-1985, 34 y.o.   MRN: [id number]  HPI 34 year old male who  has a past medical history of Back pain, Left ankle sprain, Neck pain, and Skin irritation.  He continues to have upper back muscle tightness, especially around his left thoracic paraspinal area. Back pain is constant. He has been seen by different specialists, PT, chiropractor, rheumatologist, gastroenterologist, and orthopedics. He is currently back at the chiropractor and reports that his back pain is relieved somewhat, momentarily after chiropractor and massage therapy.   He continues to frustrated on no clear cut cause  He would like to return to PT and is wondering if there is anyone else he can see to try and find a cause  Review of Systems See HPI   Past Medical History:  Diagnosis Date  . Back pain   . Left ankle sprain   . Neck pain   . 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 Marital status: Single    Spouse name: Not on file  . Number of children: 0  . Years of education: college  . Highest education level: Bachelor's degree (e.g., BA, AB, BS)  Occupational History  . Occupation: 5th grade teacher  Tobacco Use  . Smoking status: Never Smoker  . Smokeless tobacco: Never Used  Substance and Sexual Activity  . Alcohol use: Yes    Comment: Socially   . Drug use: Not Currently  . Sexual activity: Yes    Comment: male partner  Other Topics Concern  . Not on file  Social History Narrative   Automotive engineer    Not married    No kids      Likes to work out and bowl.   Right-handed.   One cup tea per day.      Social Determinants of Health   Financial Resource Strain:   . Difficulty of Paying Living Expenses:   Food Insecurity:   . Worried About Charity fundraiser in the Last Year:   . Arboriculturist in the Last Year:   Transportation  Needs:   . Film/video editor (Medical):   [name] Lack of Transportation (Non-Medical):   Physical Activity:   . Days of Exercise per Week:   . Minutes of Exercise per Session:   Stress:   . Feeling of Stress :   Social Connections:   . Frequency of Communication with Friends and Family:   . Frequency of Social Gatherings with Friends and Family:   . Attends Religious Services:   . Active Member of Clubs or Organizations:   . Attends Archivist Meetings:   [name] Marital Status:   Intimate Partner Violence:   . Fear of Current or Ex-Partner:   . Emotionally Abused:   [name] Physically Abused:   . Sexually Abused:     History reviewed. No pertinent surgical history.  Family History  Problem Relation Age of Onset  . Alcohol abuse Father   . Ulcers Father   . Heart disease Maternal Grandmother   . Cancer Paternal Grandmother        breast  . Thyroid disease Mother     Allergies  Allergen Reactions  . Bee Venom Swelling    No current outpatient medications on file prior to visit.   No current facility-administered medications  on file prior to visit.    BP 120/78   Temp 97.8 F (36.6 C)   Wt 201 lb (91.2 kg)   BMI 28.03 kg/m       Objective:   Physical Exam Vitals and nursing note reviewed.  Constitutional:      Appearance: Normal appearance.  Musculoskeletal:        General: Tenderness ( left thoracic paraspinal area and left trapezius area) present. Normal range of motion.  Skin:    General: Skin is warm and dry.  Neurological:     General: No focal deficit present.     Mental Status: He is alert. Mental status is at baseline. He is disoriented.       Assessment & Plan:  1. Thoracic spine pain - he has been worked up many times for these issues without any conclusive findings. Will refer back to PT and send to Physical Medicine and Rehab  - Ambulatory referral to Physical Therapy - Ambulatory referral to Physical Medicine Rehab  2. Cervical  radiculitis  - Ambulatory referral to Physical Therapy - Ambulatory referral to Physical Medicine Rehab   Time spent on chart review, time with patient; discussion of past treatments and future treatments and documentation was 30 minutes   Shirline Frees, NP

## 2020-02-03 NOTE — Addendum Note (Signed)
Addended by: Nancy Fetter on: 02/03/2020 07:35 AM   Modules accepted: Level of Service

## 2020-02-09 ENCOUNTER — Encounter: Payer: Self-pay | Admitting: Adult Health

## 2020-02-12 ENCOUNTER — Other Ambulatory Visit: Payer: Self-pay

## 2020-02-12 ENCOUNTER — Ambulatory Visit: Payer: BC Managed Care – PPO | Admitting: Physical Therapy

## 2020-02-12 ENCOUNTER — Encounter: Payer: Self-pay | Admitting: Physical Medicine & Rehabilitation

## 2020-02-12 DIAGNOSIS — M62830 Muscle spasm of back: Secondary | ICD-10-CM | POA: Diagnosis not present

## 2020-02-12 DIAGNOSIS — M546 Pain in thoracic spine: Secondary | ICD-10-CM

## 2020-02-13 ENCOUNTER — Encounter: Payer: Self-pay | Admitting: Physical Therapy

## 2020-02-13 ENCOUNTER — Encounter: Payer: Self-pay | Admitting: Adult Health

## 2020-02-13 ENCOUNTER — Ambulatory Visit (INDEPENDENT_AMBULATORY_CARE_PROVIDER_SITE_OTHER): Payer: BC Managed Care – PPO | Admitting: Adult Health

## 2020-02-13 ENCOUNTER — Other Ambulatory Visit: Payer: Self-pay

## 2020-02-13 VITALS — BP 100/70 | Temp 97.6°F | Wt 200.0 lb

## 2020-02-13 DIAGNOSIS — R5383 Other fatigue: Secondary | ICD-10-CM | POA: Diagnosis not present

## 2020-02-13 DIAGNOSIS — R42 Dizziness and giddiness: Secondary | ICD-10-CM | POA: Diagnosis not present

## 2020-02-13 NOTE — Progress Notes (Signed)
Subjective:    Patient ID: Marcus Bullock, male    DOB: 1986-05-31, 34 y.o.   MRN: [id number]  HPI 34 year old male who  has a past medical history of Back pain, Left ankle sprain, Neck pain, and Skin irritation.   She presents to the office today for a new issue of sudden onset of fatigue and dizziness with activity.  This first happened in about 2 weeks ago when he went hiking and became extremely fatigued and dizzy during the hike after about 15 minutes, it took him about 30 minutes to feel back to normal.   The second instance is when he went to the white water rafting center in [place] last weekend after he got done kayaking ( about 30 minutes) he started to feel extremely exhausted and fatigued as well as dizzy and felt as though he was going to faint. He denies a syncopal episode. No symptoms during kayaking. He felt bad for about 4 hours after.   He has not done any exercise since and has not had any symptoms.   He does report mild chest pain intermittently during the episodes. He does report mild nausea as well. No SOB  Does report staying hydrated during exercise  Review of Systems See HPI   Past Medical History:  Diagnosis Date  . Back pain   . Left ankle sprain   . Neck pain   . 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 Marital status: Single    Spouse name: Not on file  . Number of children: 0  . Years of education: college  . Highest education level: Bachelor's degree (e.g., BA, AB, BS)  Occupational History  . Occupation: 5th grade teacher  Tobacco Use  . Smoking status: Never Smoker  . Smokeless tobacco: Never Used  Substance and Sexual Activity  . Alcohol use: Yes    Comment: Socially   . Drug use: Not Currently  . Sexual activity: Yes    Comment: male partner  Other Topics Concern  . Not on file  Social History Narrative   Tourist information centre manager    Not  married    No kids      Likes to work out and bowl.   Right-handed.   One cup tea per day.      Social Determinants of Health   Financial Resource Strain:   . Difficulty of Paying Living Expenses:   Food Insecurity:   . Worried About Programme researcher, broadcasting/film/video in the Last Year:   . Barista in the Last Year:   Transportation Needs:   . Freight forwarder (Medical):   [name] Lack of Transportation (Non-Medical):   Physical Activity:   . Days of Exercise per Week:   . Minutes of Exercise per Session:   Stress:   . Feeling of Stress :   Social Connections:   . Frequency of Communication with Friends and Family:   . Frequency of Social Gatherings with Friends and Family:   . Attends Religious Services:   . Active Member of Clubs or Organizations:   . Attends Banker Meetings:   [name] Marital Status:   Intimate Partner Violence:   . Fear of Current or Ex-Partner:   . Emotionally Abused:   [name] Physically Abused:   . Sexually Abused:     History reviewed. No pertinent surgical history.  Family History  Problem Relation Age of Onset  . Alcohol abuse Father   . Ulcers Father   . Heart disease Maternal Grandmother   . Cancer Paternal Grandmother        breast  . Thyroid disease Mother     Allergies  Allergen Reactions  . Bee Venom Swelling    No current outpatient medications on file prior to visit.   No current facility-administered medications on file prior to visit.    BP 100/70   Temp 97.6 F (36.4 C)   Wt 200 lb (90.7 kg)   BMI 27.89 kg/m       Objective:   Physical Exam Vitals and nursing note reviewed.  Constitutional:      Appearance: Normal appearance.  Cardiovascular:     Rate and Rhythm: Normal rate and regular rhythm.     Pulses: Normal pulses.     Heart sounds: Normal heart sounds.  Pulmonary:     Effort: Pulmonary effort is normal.     Breath sounds: Normal breath sounds.  Musculoskeletal:        General: Normal range of  motion.  Skin:    General: Skin is warm and dry.  Neurological:     General: No focal deficit present.     Mental Status: He is alert and oriented to person, place, and time.  Psychiatric:        Mood and Affect: Mood normal.        Behavior: Behavior normal.        Thought Content: Thought content normal.        Judgment: Judgment normal.       Assessment & Plan:  Unknown cause of his symptoms at this time.  We will recheck labs.  His EKG showed Sinus  Rhythm  -With rate variation Inferolateral ST-elevation -repolarization variant. Rate 82.  It is consistent with previous EKG.  Consider referral to cardiology for stress test.  Encouraged light exercise over the weekend to see how he does.  [name], NP

## 2020-02-13 NOTE — Therapy (Signed)
[facility] [address] [place], [place], [address] Phone: [phone]   Fax:  [phone]  Physical Therapy Evaluation  Patient Details  Name: Marcus Bullock MRN: [id number] Date of Birth: [birth year] Referring Provider (PT): Shirline Frees   Encounter Date: 02/12/2020  PT End of Session - 02/13/20 2207    Visit Number  1    Number of Visits  12    Date for PT Re-Evaluation  03/25/20    Authorization Type  BCBS    PT Start Time  1600    PT Stop Time  1653    PT Time Calculation (min)  53 min    Activity Tolerance  Patient tolerated treatment well    Behavior During Therapy  [facility] for tasks assessed/performed       Past Medical History:  Diagnosis Date  . Back pain   . Left ankle sprain   . Neck pain   . 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 No pertinent surgical history.  There were no vitals filed for this visit.   Subjective Assessment - 02/13/20 2155    Subjective  Pt states ongoing pain in thoracic spine and shoulder blade region. He has had pain in the past, as well as in his low back, but has increased quite a bit lately, no injury to report. He also states tenderness in anterior/lateral ribs at level of sternum and below. Due to location on sympotms, pt has had work up from several disciples, including chiropractor, PCP, He has been to ER with "chest pain" and also had EKG. States mild relief from a couple PT sessions, and chiro, but pain continues. He works as Pension scheme manager, has been able to work through pain. Usually is very active with working out, has not done much at all in last year, due to pain, but would like to resume exercise.    Limitations  Lifting;Walking;House hold activities;Standing    Diagnostic tests  Cervical and thoracic MRI, DG chest, EKG , also has had thoracic injection (no change)    Patient Stated Goals  decreased pain    Currently  in Pain?  Yes    Pain Score  6     Pain Location  Thoracic    Pain Orientation  Left;Upper;Mid;Lower    Pain Descriptors / Indicators  Tightness;Spasm;Sore    Pain Type  Chronic pain    Pain Onset  More than a month ago    Pain Frequency  Constant    Aggravating Factors   most movement    Pain Relieving Factors  none         [facility] PT Assessment - 02/13/20 0001      Assessment   Medical Diagnosis  Thoracic pain    Referring Provider (PT)  Shirline Frees    Prior Therapy  1 visit for LBP      Balance Screen   Has the patient fallen in the past 6 months  No      Prior Function   Level of Independence  Independent      Cognition   Overall Cognitive Status  Within Functional Limits for tasks assessed      Observation/Other Assessments   Skin Integrity  Pt with concern for raised skin on L pec, PCP aware and pt has seen dermatology as well.       Posture/Postural Control   Posture Comments  poor seated  posture, tendency for rounded shoulders and fwd head       ROM / Strength   AROM / PROM / Strength  AROM;Strength      AROM   Overall AROM Comments  Shoulders: WNL bil    AROM Assessment Site  Cervical;Lumbar;Thoracic    Cervical Flexion  wfl    Cervical Extension  wfl    Cervical - Right Rotation  wfl    Cervical - Left Rotation  wfl    Lumbar Flexion  mild/mod deficit, increased motion from hamstrings    Lumbar Extension  mild deficit    Lumbar - Right Side Bend  mild deficit/ pain    Lumbar - Left Side Bend  mild deficit    Thoracic Flexion  wfl    Thoracic Extension  wfl    Thoracic - Right Side Bend  wfl/ sore    Thoracic - Left Side Bend  wfl/sore      Strength   Overall Strength Comments  Shoulders: 4+/5 bil;  scapular: 4/5  on L       Palpation   Palpation comment  Mild tenderness of anterior rib insertion at sternum, as well as lateral, lower ribs,  Significant hypertonicity and soreness of L thoracici paraspinal/ erector spinae (entire length),  Sorness and  trigger points in L infraspinatus and teres, and rhomboid.  Tightness in R QL, and low lumbar region. Tenderness and hypomobility of several thoracic spinous process with PAs.       Special Tests   Other special tests  Neg for radicular pain in UE or LE                   Objective measurements completed on examination: See above findings.      [facility] Adult PT Treatment/Exercise - 02/13/20 0001      Modalities   Modalities  Moist Heat      Moist Heat Therapy   Number Minutes Moist Heat  10 Minutes    Moist Heat Location  Other (comment)   L thoracic spine     Manual Therapy   Manual Therapy  Joint mobilization;Soft tissue mobilization    Soft tissue mobilization  STM/IASTM to L thoracic paraspinals, rhomboid and posterior shoulder        Trigger Point Dry Needling - 02/13/20 0001    Consent Given?  Yes    Education Handout Provided  Yes    Muscles Treated Upper Quadrant  Infraspinatus;Longissimus    Infraspinatus Response  Twitch response elicited;Palpable increased muscle length   L   Longissimus Response  Twitch response elicited;Palpable increased muscle length   L thoracic           PT Education - 02/13/20 2206    Education Details  education on Dry needling, PT POC, Exam findings,    Person(s) Educated  Patient    Methods  Explanation;Demonstration    Comprehension  Verbalized understanding       PT Short Term Goals - 02/13/20 2210      PT SHORT TERM GOAL #1   Title  independent with initial HEP    Time  2    Period  Weeks    Status  New    Target Date  02/26/20      PT SHORT TERM GOAL #2   Title  Pt to report decreased pain to 4/10 in t-spine    Time  2    Period  Weeks    Status  New  Target Date  02/26/20        PT Long Term Goals - 02/13/20 2211      PT LONG TERM GOAL #1   Title  Pt to report decreased pain in thoracic spine to 0-2/10 with activity    Time  6    Period  Weeks    Status  New    Target Date  03/25/20       PT LONG TERM GOAL #2   Title  Pt to demo soft tissue restirctions in t-spine to be WN    Time  6    Period  Weeks    Status  New    Target Date  03/25/20      PT LONG TERM GOAL #3   Title  Pt to demo ability for optimal seated posture, as well as ability for proper lift/squat/bend technique for improved pain in back.    Time  6    Period  Weeks    Status  New    Target Date  03/25/20      PT LONG TERM GOAL #4   Title  Pt to be independent with long term HEP for thoracic and lumbar spine    Time  6    Period  Weeks    Status  New    Target Date  03/25/20             Plan - 02/13/20 2214    Clinical Impression Statement  Pt presents wtih primary complaint of increased pain in L sided thoracic reigon. He has pain and hypertonicity in L paraspinals, rhomboid, and into posterior shoulder (infraspinatus and teres). Pt also with tenderness at anterior ribs with palaption. He has tightness and soreness in R lumbar musculature as well with chroninc low back pain. Pt has had several other negative tests, as well as thoracic injection that did not help pain. Pt with much decrease in normal activitiy level or ability for workouts, due to pain. Pt to benefit from skilled PT to improve deficits and pain, and return to plof and exercise. Dry needling performed today, with good twitch response and pt tolerance, will asses effects next visit.    Personal Factors and Comorbidities  Time since onset of injury/illness/exacerbation    Examination-Activity Limitations  Reach Overhead;Bend;Sleep;Squat;Carry;Stand;Lift    Examination-Participation Restrictions  Meal Prep;Cleaning;Community Activity;Shop;Driving;Laundry;Yard Work    Stability/Clinical Decision Making  Stable/Uncomplicated    Designer, jewellery  Low    Rehab Potential  Good    PT Frequency  2x / week    PT Duration  6 weeks    PT Treatment/Interventions  ADLs/Self Care Home Management;Electrical  Stimulation;Cryotherapy;Traction;Moist Heat;Iontophoresis 4mg /ml Dexamethasone;Ultrasound;Gait training;Neuromuscular re-education;Therapeutic exercise;Therapeutic activities;Functional mobility training;Stair training;Patient/family education;Manual techniques;Taping;Dry needling;Passive range of motion;Spinal Manipulations;Joint Manipulations    Consulted and Agree with Plan of Care  Patient       Patient will benefit from skilled therapeutic intervention in order to improve the following deficits and impairments:  Increased muscle spasms, Decreased activity tolerance, Pain, Improper body mechanics, Decreased strength, Decreased mobility  Visit Diagnosis: Pain in thoracic spine  Muscle spasm of back     Problem List Patient Active Problem List   Diagnosis Date Noted  . Muscle spasm 09/11/2019  . Bilateral low back pain without sciatica 09/11/2019  . Ankle impingement syndrome 08/11/2018  . Breast skin changes 01/18/2017  . Seasonal allergic rhinitis 01/18/2017  . Acute low back pain 01/18/2017    Lyndee Hensen, PT, DPT 10:37 PM  02/13/20    [facility] [address] [place], [place], [address] Phone: [phone]   Fax:  [phone]  Name: Marcus Bullock MRN: [id number] Date of Birth: 07/31/1986

## 2020-02-16 LAB — TSH: TSH: 1.87 mIU/L (ref 0.40–4.50)

## 2020-02-16 LAB — IRON,TIBC AND FERRITIN PANEL
%SAT: 17 % (calc) — ABNORMAL LOW (ref 20–48)
Ferritin: 150 ng/mL (ref 38–380)
Iron: 49 ug/dL — ABNORMAL LOW (ref 50–180)
TIBC: 294 mcg/dL (calc) (ref 250–425)

## 2020-02-16 LAB — HEMOGLOBIN A1C
Hgb A1c MFr Bld: 4.3 % of total Hgb (ref <5.7)
Mean Plasma Glucose: 77 (calc)
eAG (mmol/L): 4.2 (calc)

## 2020-02-16 LAB — COMPREHENSIVE METABOLIC PANEL
AG Ratio: 1.9 (calc) (ref 1.0–2.5)
ALT: 24 U/L (ref 9–46)
AST: 22 U/L (ref 10–40)
Albumin: 4.3 g/dL (ref 3.6–5.1)
Alkaline phosphatase (APISO): 44 U/L (ref 36–130)
BUN: 11 mg/dL (ref 7–25)
CO2: 26 mmol/L (ref 20–32)
Calcium: 9.2 mg/dL (ref 8.6–10.3)
Chloride: 104 mmol/L (ref 98–110)
Creat: 1.12 mg/dL (ref 0.60–1.35)
Globulin: 2.3 g/dL (calc) (ref 1.9–3.7)
Glucose, Bld: 80 mg/dL (ref 65–99)
Potassium: 4 mmol/L (ref 3.5–5.3)
Sodium: 139 mmol/L (ref 135–146)
Total Bilirubin: 0.4 mg/dL (ref 0.2–1.2)
Total Protein: 6.6 g/dL (ref 6.1–8.1)

## 2020-02-16 LAB — CBC WITH DIFFERENTIAL/PLATELET
Absolute Monocytes: 764 cells/uL (ref 200–950)
Basophils Absolute: 40 cells/uL (ref 0–200)
Basophils Relative: 0.7 %
Eosinophils Absolute: 91 cells/uL (ref 15–500)
Eosinophils Relative: 1.6 %
HCT: 40.8 % (ref 38.5–50.0)
Hemoglobin: 13.6 g/dL (ref 13.2–17.1)
Lymphs Abs: 1397 cells/uL (ref 850–3900)
MCH: 30 pg (ref 27.0–33.0)
MCHC: 33.3 g/dL (ref 32.0–36.0)
MCV: 89.9 fL (ref 80.0–100.0)
MPV: 10.7 fL (ref 7.5–12.5)
Monocytes Relative: 13.4 %
Neutro Abs: 3409 cells/uL (ref 1500–7800)
Neutrophils Relative %: 59.8 %
Platelets: 255 10*3/uL (ref 140–400)
RBC: 4.54 10*6/uL (ref 4.20–5.80)
RDW: 11.3 % (ref 11.0–15.0)
Total Lymphocyte: 24.5 %
WBC: 5.7 10*3/uL (ref 3.8–10.8)

## 2020-02-16 LAB — VITAMIN D 25 HYDROXY (VIT D DEFICIENCY, FRACTURES): Vit D, 25-Hydroxy: 20 ng/mL — ABNORMAL LOW (ref 30–100)

## 2020-02-16 LAB — HEPATITIS C ANTIBODY
Hepatitis C Ab: NONREACTIVE
SIGNAL TO CUT-OFF: 0.04 (ref <1.00)

## 2020-02-17 ENCOUNTER — Ambulatory Visit: Payer: BC Managed Care – PPO | Admitting: Physical Therapy

## 2020-02-17 ENCOUNTER — Encounter: Payer: Self-pay | Admitting: Physical Therapy

## 2020-02-17 ENCOUNTER — Other Ambulatory Visit: Payer: Self-pay

## 2020-02-17 DIAGNOSIS — M546 Pain in thoracic spine: Secondary | ICD-10-CM

## 2020-02-17 DIAGNOSIS — M62830 Muscle spasm of back: Secondary | ICD-10-CM | POA: Diagnosis not present

## 2020-02-17 NOTE — Therapy (Signed)
[facility] [address] [place], [place], [address] Phone: [phone]   Fax:  [phone]  Physical Therapy Treatment  Patient Details  Name: Marcus Bullock MRN: [id number] Date of Birth: 04/07/1986 Referring Provider (PT): [name]   Encounter Date: 02/17/2020  PT End of Session - 02/17/20 1542    Visit Number  2    Number of Visits  12    Date for PT Re-Evaluation  03/25/20    Authorization Type  BCBS    PT Start Time  1500    PT Stop Time  1550    PT Time Calculation (min)  50 min    Activity Tolerance  Patient tolerated treatment well    Behavior During Therapy  [facility] for tasks assessed/performed       Past Medical History:  Diagnosis Date  . Back pain   . Left ankle sprain   . Neck pain   . 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 No pertinent surgical history.  There were no vitals filed for this visit.  Subjective Assessment - 02/17/20 1541    Subjective  Pt states soreness after last visit from DN but has not noticed much pain relief . Also notes increased pain in R low lumbar region. He played basketball this week and noticed increased soreness in entire thoraic and lumbar region after playing.    Currently in Pain?  Yes    Pain Score  6     Pain Location  Thoracic    Pain Orientation  Left;Upper    Pain Descriptors / Indicators  Tightness;Stabbing;Squeezing    Pain Onset  More than a month ago    Pain Frequency  Intermittent                        [facility] Adult PT Treatment/Exercise - 02/17/20 0001      Exercises   Exercises  Lumbar      Lumbar Exercises: Stretches   Lower Trunk Rotation  5 reps;10 seconds    Other Lumbar Stretch Exercise  Standing QL stretch 30 sec x 3 bil;       Lumbar Exercises: Standing   Row  20 reps    Theraband Level (Row)  Level 3 (Green)      Modalities   Modalities  Moist Heat      Moist  Heat Therapy   Number Minutes Moist Heat  10 Minutes    Moist Heat Location  Other (comment)   L thoracic spine      Manual Therapy   Manual therapy comments  skilled palpation and monitoring of soft tissue with dry needling.     Soft tissue mobilization  DMT/IASTM to R QL,  L thoracic paraspinals, rhomboid, and mid/low trap.        Trigger Point Dry Needling - 02/17/20 0001    Consent Given?  Yes    Education Handout Provided  Previously provided    Muscles Treated Upper Quadrant  Rhomboids;Longissimus    Other Dry Needling  low trap / twitch response, increased tissue length.     Rhomboids Response  Twitch response elicited;Palpable increased muscle length   L   Longissimus Response  Twitch response elicited;Palpable increased muscle length   L            PT Short Term Goals - 02/13/20 2210      PT  SHORT TERM GOAL #1   Title  independent with initial HEP    Time  2    Period  Weeks    Status  New    Target Date  02/26/20      PT SHORT TERM GOAL #2   Title  Pt to report decreased pain to 4/10 in t-spine    Time  2    Period  Weeks    Status  New    Target Date  02/26/20        PT Long Term Goals - 02/13/20 2211      PT LONG TERM GOAL #1   Title  Pt to report decreased pain in thoracic spine to 0-2/10 with activity    Time  6    Period  Weeks    Status  New    Target Date  03/25/20      PT LONG TERM GOAL #2   Title  Pt to demo soft tissue restirctions in t-spine to be WN    Time  6    Period  Weeks    Status  New    Target Date  03/25/20      PT LONG TERM GOAL #3   Title  Pt to demo ability for optimal seated posture, as well as ability for proper lift/squat/bend technique for improved pain in back.    Time  6    Period  Weeks    Status  New    Target Date  03/25/20      PT LONG TERM GOAL #4   Title  Pt to be independent with long term HEP for thoracic and lumbar spine    Time  6    Period  Weeks    Status  New    Target Date  03/25/20             Plan - 02/17/20 1544    Clinical Impression Statement  Pt with good tolerance for dry needling today, did get more twitch response than previously. Pt has most painful trigger point at L rhomboid and mid trap region. Reviewed stretches for HEP today. Pt to benefit from continued manual therapy, and progression of ther ex as pain improves.    Personal Factors and Comorbidities  Time since onset of injury/illness/exacerbation    Examination-Activity Limitations  Reach Overhead;Bend;Sleep;Squat;Carry;Stand;Lift    Examination-Participation Restrictions  Meal Prep;Cleaning;Community Activity;Shop;Driving;Laundry;Yard Work    Stability/Clinical Decision Making  Stable/Uncomplicated    Rehab Potential  Good    PT Frequency  2x / week    PT Duration  6 weeks    PT Treatment/Interventions  ADLs/Self Care Home Management;Electrical Stimulation;Cryotherapy;Traction;Moist Heat;Iontophoresis 4mg /ml Dexamethasone;Ultrasound;Gait training;Neuromuscular re-education;Therapeutic exercise;Therapeutic activities;Functional mobility training;Stair training;Patient/family education;Manual techniques;Taping;Dry needling;Passive range of motion;Spinal Manipulations;Joint Manipulations    Consulted and Agree with Plan of Care  Patient       Patient will benefit from skilled therapeutic intervention in order to improve the following deficits and impairments:  Increased muscle spasms, Decreased activity tolerance, Pain, Improper body mechanics, Decreased strength, Decreased mobility  Visit Diagnosis: Pain in thoracic spine  Muscle spasm of back     Problem List Patient Active Problem List   Diagnosis Date Noted  . Muscle spasm 09/11/2019  . Bilateral low back pain without sciatica 09/11/2019  . Ankle impingement syndrome 08/11/2018  . Breast skin changes 01/18/2017  . Seasonal allergic rhinitis 01/18/2017  . Acute low back pain 01/18/2017    01/20/2017, PT, DPT 3:50 PM  02/17/20    [facility] [address] [place], [place], [address] Phone: [phone]   Fax:  [phone]  Name: Marcus Bullock MRN: [id number] Date of Birth: 1986-08-16

## 2020-02-24 ENCOUNTER — Encounter: Payer: BC Managed Care – PPO | Admitting: Physical Therapy

## 2020-03-02 ENCOUNTER — Other Ambulatory Visit: Payer: Self-pay

## 2020-03-02 ENCOUNTER — Ambulatory Visit (INDEPENDENT_AMBULATORY_CARE_PROVIDER_SITE_OTHER): Payer: BC Managed Care – PPO | Admitting: Physical Therapy

## 2020-03-02 DIAGNOSIS — M62830 Muscle spasm of back: Secondary | ICD-10-CM | POA: Diagnosis not present

## 2020-03-02 DIAGNOSIS — M546 Pain in thoracic spine: Secondary | ICD-10-CM

## 2020-03-03 ENCOUNTER — Encounter: Payer: Self-pay | Admitting: Physical Therapy

## 2020-03-03 NOTE — Therapy (Addendum)
[facility] [address] [place], [place], [address] Phone: [phone]   Fax:  [phone]  Physical Therapy Treatment  Patient Details  Name: Marcus Bullock MRN: [id number] Date of Birth: January 08, 1986 Referring Provider (PT): [name]   Encounter Date: 03/02/2020  PT End of Session - 03/03/20 2145    Visit Number  3    Number of Visits  12    Date for PT Re-Evaluation  03/25/20    Authorization Type  BCBS    PT Start Time  1605    PT Stop Time  1647    PT Time Calculation (min)  42 min    Activity Tolerance  Patient tolerated treatment well    Behavior During Therapy  [facility] for tasks assessed/performed       Past Medical History:  Diagnosis Date  . Back pain   . Left ankle sprain   . Neck pain   . 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 No pertinent surgical history.  There were no vitals filed for this visit.  Subjective Assessment - 03/03/20 2141    Subjective  Pt states decreased pain since last session. He still has pain in upper back, but decreased. Improved pain with deep breath. Was able to work out one day, did chest presses, pecs are now sore. He also had massage with good pain relief. Pt also reports another incidence of feeling very hot, tired, dizzy over the weekend, he felt better after rest and eating something.    Currently in Pain?  Yes    Pain Score  4     Pain Location  Thoracic    Pain Orientation  Left;Upper;Mid    Pain Descriptors / Indicators  Aching;Spasm;Tightness    Pain Type  Chronic pain    Pain Onset  More than a month ago    Pain Frequency  Intermittent                        [facility] Adult PT Treatment/Exercise - 03/03/20 0001      Lumbar Exercises: Stretches   Lower Trunk Rotation  5 reps;10 seconds    Pelvic Tilt  20 reps    Other Lumbar Stretch Exercise  childs pose 30 sec x 3 l, r , center    Other Lumbar  Stretch Exercise  Doorway chest stretch 30 sec x 3 90 deg ;       Lumbar Exercises: Standing   Row  20 reps    Theraband Level (Row)  Level 4 (Blue)      Lumbar Exercises: Supine   Ab Set  10 reps    Bent Knee Raise  20 reps      Manual Therapy   Manual therapy comments  skilled palpation and monitoring of soft tissue with dry needling.     Soft tissue mobilization  DMT/IASTM to R  L thoracic paraspinals, rhomboid, and mid/low trap.        Trigger Point Dry Needling - 03/03/20 0001    Consent Given?  Yes    Education Handout Provided  Previously provided    Muscles Treated Upper Quadrant  Rhomboids;Longissimus    Other Dry Needling  mid trap / twitch response, increased tissue length.     Rhomboids Response  Twitch response elicited;Palpable increased muscle length    Longissimus Response  Twitch response elicited;Palpable increased muscle length  PT Short Term Goals - 02/13/20 2210      PT SHORT TERM GOAL #1   Title  independent with initial HEP    Time  2    Period  Weeks    Status  New    Target Date  02/26/20      PT SHORT TERM GOAL #2   Title  Pt to report decreased pain to 4/10 in t-spine    Time  2    Period  Weeks    Status  New    Target Date  02/26/20        PT Long Term Goals - 02/13/20 2211      PT LONG TERM GOAL #1   Title  Pt to report decreased pain in thoracic spine to 0-2/10 with activity    Time  6    Period  Weeks    Status  New    Target Date  03/25/20      PT LONG TERM GOAL #2   Title  Pt to demo soft tissue restirctions in t-spine to be WN    Time  6    Period  Weeks    Status  New    Target Date  03/25/20      PT LONG TERM GOAL #3   Title  Pt to demo ability for optimal seated posture, as well as ability for proper lift/squat/bend technique for improved pain in back.    Time  6    Period  Weeks    Status  New    Target Date  03/25/20      PT LONG TERM GOAL #4   Title  Pt to be independent with long term HEP  for thoracic and lumbar spine    Time  6    Period  Weeks    Status  New    Target Date  03/25/20            Plan - 03/03/20 2146    Clinical Impression Statement  Pt educated on TA contraction, req max cuing to achieve. Discussed posture with exercise, and need for TA when doing UE weight lifting. Pt to benefit from continued education and practice on core stabilization for decreased pain in upper and lower back. Pt with good response to dry needling and manual DTM today. Plan to progress strength as pain improves.    Personal Factors and Comorbidities  Time since onset of injury/illness/exacerbation    Examination-Activity Limitations  Reach Overhead;Bend;Sleep;Squat;Carry;Stand;Lift    Examination-Participation Restrictions  Meal Prep;Cleaning;Community Activity;Shop;Driving;Laundry;Yard Work    Stability/Clinical Decision Making  Stable/Uncomplicated    Rehab Potential  Good    PT Frequency  2x / week    PT Duration  6 weeks    PT Treatment/Interventions  ADLs/Self Care Home Management;Electrical Stimulation;Cryotherapy;Traction;Moist Heat;Iontophoresis 66m/ml Dexamethasone;Ultrasound;Gait training;Neuromuscular re-education;Therapeutic exercise;Therapeutic activities;Functional mobility training;Stair training;Patient/family education;Manual techniques;Taping;Dry needling;Passive range of motion;Spinal Manipulations;Joint Manipulations    Consulted and Agree with Plan of Care  Patient       Patient will benefit from skilled therapeutic intervention in order to improve the following deficits and impairments:  Increased muscle spasms, Decreased activity tolerance, Pain, Improper body mechanics, Decreased strength, Decreased mobility  Visit Diagnosis: Pain in thoracic spine  Muscle spasm of back     Problem List Patient Active Problem List   Diagnosis Date Noted  . Muscle spasm 09/11/2019  . Bilateral low back pain without sciatica 09/11/2019  . Ankle impingement syndrome  08/11/2018  . Breast  skin changes 01/18/2017  . Seasonal allergic rhinitis 01/18/2017  . Acute low back pain 01/18/2017   Lyndee Hensen, PT, DPT 9:48 PM  03/03/20    [facility] [place], [place], [address] Phone: [phone]   Fax:  [phone]  Name: DERRICH GABY MRN: [id number] Date of Birth: 05/24/1986   PHYSICAL THERAPY DISCHARGE SUMMARY  Visits from Start of Care:3 Plan: Patient agrees to discharge.  Patient goals were not met. Patient is being discharged due to not returning since the last visit.  ?????     Lyndee Hensen, PT, DPT 2:48 PM  10/13/20

## 2020-03-05 ENCOUNTER — Encounter: Payer: Self-pay | Admitting: Physical Medicine & Rehabilitation

## 2020-03-05 ENCOUNTER — Encounter
Payer: BC Managed Care – PPO | Attending: Physical Medicine & Rehabilitation | Admitting: Physical Medicine & Rehabilitation

## 2020-03-05 ENCOUNTER — Other Ambulatory Visit: Payer: Self-pay

## 2020-03-05 VITALS — BP 123/78 | HR 97 | Temp 97.0°F | Ht 71.0 in | Wt 202.0 lb

## 2020-03-05 DIAGNOSIS — M7918 Myalgia, other site: Secondary | ICD-10-CM

## 2020-03-05 DIAGNOSIS — M47894 Other spondylosis, thoracic region: Secondary | ICD-10-CM

## 2020-03-05 NOTE — Progress Notes (Signed)
Subjective:    Patient ID: GROVE DEFINA, male    DOB: 09-24-1986, 34 y.o.   MRN: [id number]  HPI CC:  Mid Back and Low back pain   55yr hx of insidious and gradual  onset, occurred at the time of a severe Left ankle sprain.Ankle sprain still causes occ sharp pain.  Also did boxing and the twisting motion made the pain worse. Had some left arm pain.  Seen in ED to r/o MI negative  CT abd/pelvis performed for complaints of flank pain, Right side abd pain L4-5 facet injections not helpful  Seen by [facility]- Dr Ron Agee Rheum Neuro-Dr Krista Blue  no compressive lesion Neurosurgery  Tried  Acupuncture  Dry needling- lots of muscle twitching but no long term relief Massage Meds- Ibuprofen 800mg  , helpful,  Cyclobenzaprine  Pt was a weightlifter, ran, biked.  Still does yoga , very limited lifting, bodyweight exercises  Sleeps ok.  Pain Inventory Average Pain 6 Pain Right Now 8 My pain is sharp, burning, dull, stabbing and aching  In the last 24 hours, has pain interfered with the following? General activity 5 Relation with others 0 Enjoyment of life 8 What TIME of day is your pain at its worst? 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 Left-sided thoracic back pain.   FLUOROSCOPY TIME:  Fluoroscopy Time: 23 seconds   Radiation Exposure Index: 15.09 microGray*m^2   PROCEDURE: The procedure, risks, benefits, and alternatives were explained to the patient. Questions regarding the procedure were encouraged and answered. The patient understands and consents to the procedure.   THORACIC  EPIDURAL INJECTION   An interlaminar approach was performed on the left at T8-9. A 3.5 inch 20 gauge epidural needle was advanced using loss-of-resistance technique.   DIAGNOSTIC EPIDURAL INJECTION   Injection of Isovue-M 300 shows a good epidural pattern with spread above and below the level of needle placement, primarily on the left. No vascular opacification is seen.   THERAPEUTIC EPIDURAL INJECTION   1.5 ml of Kenalog 40 mixed with 2 ml of normal saline were then instilled. The procedure was well-tolerated, and the patient was discharged thirty minutes following the injection in good condition.   IMPRESSION: Technically successful interlaminar epidural injection on the left at T8-9.     Electronically Signed   By: Logan Bores M.D.   On: 10/13/2019 16:12   CLINICAL DATA:  Chronic low back pain and tightness. Bilateral leg pain. Symptoms of several years duration.   EXAM: MRI LUMBAR SPINE WITHOUT CONTRAST   TECHNIQUE: Multiplanar, multisequence MR imaging of the lumbar spine was performed. No intravenous contrast was administered.   COMPARISON:  None.   FINDINGS: Segmentation:  5 lumbar type vertebral bodies assumed.   Alignment: Minimal curvature convex to the right with the apex at L2-3.   Vertebrae:  Normal   Conus medullaris and cauda equina: Conus extends to the T12-L1 level. Conus and cauda equina appear normal.   Paraspinal and other soft tissues: Normal   Disc levels:   No degenerative disc disease. No bulge or herniation. No facet arthropathy or pars defect. Wide patency of the canal and foramina.   IMPRESSION: Normal examination,  with the exception of very mild curvature convex to the right. No degenerative changes. No stenosis. No cause of the presenting symptoms is identified.     Electronically Signed   By: Paulina Fusi M.D.   On: 01/05/2018 14:08 CLINICAL DATA:  Neck and back pain which is chronic but worsening. Pain extends more  towards the left.   EXAM: MRI CERVICAL AND THORACIC SPINE WITHOUT CONTRAST   TECHNIQUE: Multiplanar and multiecho pulse sequences of the cervical spine, to include the craniocervical junction and cervicothoracic junction, and the thoracic spine, were obtained without intravenous contrast.   COMPARISON:  Radiography 01/27/2019   FINDINGS: MRI CERVICAL SPINE FINDINGS   Alignment: Normal   Vertebrae: Normal   Cord: Normal   Posterior Fossa, vertebral arteries, paraspinal tissues: Normal   Disc levels:   No evidence of disc degeneration, bulge or herniation. Wide patency of the canal and foramina. No evidence of facet arthropathy.   MRI THORACIC SPINE FINDINGS   Alignment:  Normal   Vertebrae: No fracture or primary bone lesion.   Cord:  No cord compression or primary cord lesion.   Paraspinal and other soft tissues: Negative   Disc levels:   Mild, non-compressive disc bulges at T2-3, and T3-4. more pronounced disc degeneration from T5-6 through T8-9. Small disc protrusions at those for levels, largest at the T8-9 level, which indents the ventral subarachnoid space but does not affect cord. No evidence of foraminal extension. No abnormality seen from T9-10 through T12-L1.   IMPRESSION: MRI cervical spine: Normal   MRI thoracic spine: 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 This is largest at T8-9, indenting the ventral subarachnoid space but not compressing the cord. No foraminal extension or encroachment seen at any level. Findings could possibly be associated with thoracic region back pain.     Electronically Signed   By: Paulina Fusi M.D.   On: 07/17/2019 11:34    Physicians involved in your care new   Family History  Problem Relation Age of Onset  . Alcohol abuse Father   . Ulcers Father   . Heart disease Maternal Grandmother   . Cancer Paternal Grandmother        breast  . Thyroid disease Mother    Social  History   Socioeconomic History  . Marital status: Single    Spouse name: Not on file  . Number of children: 0  . Years of education: college  . Highest education level: Bachelor's degree (e.g., BA, AB, BS)  Occupational History  . Occupation: 5th grade teacher  Tobacco Use  . Smoking status: Never Smoker  . Smokeless tobacco: Never Used  Substance and Sexual Activity  . Alcohol use: Yes    Comment: Socially   . Drug use: Not Currently  . Sexual activity: Yes    Comment: male partner  Other Topics Concern  . Not on file  Social History Narrative   Tourist information centre manager    Not married    No kids      Likes to work out and bowl.   Right-handed.   One cup tea per day.      Social Determinants of Health   Financial Resource Strain:   . Difficulty of Paying Living Expenses:   Food Insecurity:   . Worried About Programme researcher, broadcasting/film/video in the Last Year:   . Barista in the Last Year:   Transportation Needs:   . Freight forwarder (Medical):   [name]  Lack of Transportation (Non-Medical):   Physical Activity:   . Days of Exercise per Week:   . Minutes of Exercise per Session:   Stress:   . Feeling of Stress :   Social Connections:   . Frequency of Communication with Friends and Family:   . Frequency of Social Gatherings with Friends and Family:   . Attends Religious Services:   . Active Member of Clubs or Organizations:   . Attends Banker Meetings:   [name] Marital Status:    History reviewed. No pertinent surgical history. Past Medical History:  Diagnosis Date  . Back pain   . Left ankle sprain   . Neck pain   . 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   Temp (!) 97 F (36.1 C)   Ht 5\' 11"  (1.803 m)   Wt 202 lb (91.6 kg)   BMI 28.17 kg/m   Opioid Risk Score:   Fall Risk Score:  `1  Depression screen PHQ 2/9  Depression screen [facility] 2/9 03/05/2020 08/06/2019 01/22/2018 01/22/2018 01/18/2017  Decreased  Interest 2 2 1  0 0  Down, Depressed, Hopeless 3 3 - 1 2  PHQ - 2 Score 5 5 1 1 2   Altered sleeping 2 3 - - 1  Tired, decreased energy 3 3 - - 1  Change in appetite 1 0 - - 2  Feeling bad or failure about yourself  2 0 - - 0  Trouble concentrating 0 0 - - 0  Moving slowly or fidgety/restless 0 0 - - 0  Suicidal thoughts 2 1 - - 0  PHQ-9 Score 15 12 - - 6  Difficult doing work/chores - Somewhat difficult - - -    Review of Systems  Constitutional: Negative.   Eyes: Negative.   Respiratory: Negative.   Gastrointestinal: Positive for abdominal pain and nausea.  Endocrine: Negative.   Genitourinary: Positive for difficulty urinating.  Musculoskeletal: Positive for back pain and neck pain.  Allergic/Immunologic: Negative.   Neurological: Positive for dizziness.  Psychiatric/Behavioral: Positive for confusion, dysphoric mood and suicidal ideas. The patient is nervous/anxious.   All other systems reviewed and are negative.      Objective:   Physical Exam Vitals and nursing note reviewed.  Constitutional:      Appearance: Normal appearance.  HENT:     Head: Normocephalic and atraumatic.  Eyes:     Extraocular Movements: Extraocular movements intact.     Conjunctiva/sclera: Conjunctivae normal.     Pupils: Pupils are equal, round, and reactive to light.  Cardiovascular:     Rate and Rhythm: Normal rate and regular rhythm.     Pulses: Normal pulses.     Heart sounds: Normal heart sounds. No murmur.  Pulmonary:     Effort: Pulmonary effort is normal. No respiratory distress.     Breath sounds: Normal breath sounds. No wheezing.  Abdominal:     General: Abdomen is flat. Bowel sounds are normal. There is no distension.     Palpations: Abdomen is soft.     Tenderness: There is no abdominal tenderness. There is no guarding.  Musculoskeletal:     Cervical back: Normal range of motion and neck supple. No tenderness.     Comments: Cervical lumbar and thoracic spine in good  alignment. Normal range of motion The patient has pain in the T8-T9 level on the left side paraspinal with twisting extension. With lateral leaning the patient has pain in the far  paraspinal area bilaterally No pain along the spinous processes in the cervical thoracic or lumbar spine.  Neurological:     General: No focal deficit present.     Mental Status: He is alert and oriented to person, place, and time.     Sensory: Sensation is intact.     Motor: Motor function is intact.     Coordination: Coordination is intact.     Gait: Gait is intact.     Deep Tendon Reflexes:     Reflex Scores:      Tricep reflexes are 2+ on the right side and 2+ on the left side.      Bicep reflexes are 2+ on the right side and 2+ on the left side.      Brachioradialis reflexes are 2+ on the right side and 2+ on the left side.      Patellar reflexes are 2+ on the right side and 2+ on the left side.      Achilles reflexes are 2+ on the right side and 2+ on the left side.    Comments: Motor strength is 5/5 bilateral deltoid bicep tricep grip hip flexor knee extensor ankle dorsiflexor Negative straight leg raising  Psychiatric:        Attention and Perception: Attention and perception normal.        Mood and Affect: Mood is anxious.        Speech: Speech normal.        Behavior: Behavior normal. Behavior is cooperative.        Thought Content: Thought content normal.        Cognition and Memory: Cognition normal.        Judgment: Judgment normal.           Assessment & Plan:  #1.  Chronic thoracic pain unresponsive to T8-T9 epidural injection.  We discussed pain generators in that region.  Given left greater than right paraspinal symptoms that are exacerbated by twisting and bending, would recommend T7-T8-T9 medial branch blocks under fluoroscopic guidance.  Will refer to Dr. Alvester Morin for this.  If medial branch blocks are helpful on a temporary basis, radiofrequency neurotomy with provided longer lasting  relief. We discussed that additionally there is likely to be an element of myofascial pain as well.  #2.  Upper lumbar pain appears to be in the L1-L2-L3 region.  In addition to the paraspinal pain there is a far lateral pain which may correspond to quadratus lumborum area.  We discussed side planks.  May consider T12 L1-L2 medial branch blocks in the future that can be done in this office will do this after thoracic issues are addressed.  In terms of medication management the patient has tried Cymbalta which I think is a reasonable choice but he did not like the way it made him feel.  Similarly I would stay away from gabapentin to pregabalin for similar reasons at this time. He can use as needed ibuprofen, OTC patient has had some benefit with this.  No reflux symptoms I will see him back in 1 month

## 2020-03-05 NOTE — Patient Instructions (Addendum)
May try Ibuprofen for pain 800mg  as needed  Try side planks daily work up to 1 min per side  Get left ankle pain checked out

## 2020-03-10 ENCOUNTER — Encounter: Payer: BC Managed Care – PPO | Admitting: Physical Therapy

## 2020-03-11 ENCOUNTER — Encounter (HOSPITAL_COMMUNITY): Payer: Self-pay

## 2020-03-11 ENCOUNTER — Ambulatory Visit (HOSPITAL_COMMUNITY)
Admission: EM | Admit: 2020-03-11 | Discharge: 2020-03-11 | Disposition: A | Discharge status: Home / Self Care | Payer: BC Managed Care – PPO | Attending: Family Medicine | Admitting: Family Medicine

## 2020-03-11 ENCOUNTER — Other Ambulatory Visit: Payer: Self-pay

## 2020-03-11 DIAGNOSIS — M549 Dorsalgia, unspecified: Secondary | ICD-10-CM | POA: Diagnosis not present

## 2020-03-11 DIAGNOSIS — R42 Dizziness and giddiness: Secondary | ICD-10-CM

## 2020-03-11 DIAGNOSIS — G8929 Other chronic pain: Secondary | ICD-10-CM | POA: Diagnosis not present

## 2020-03-11 MED ORDER — IBUPROFEN 800 MG PO TABS: 800.0000 mg | ORAL_TABLET | Freq: Three times a day (TID) | ORAL | 0 refills | Status: DC

## 2020-03-11 MED ORDER — TRAMADOL HCL 50 MG PO TABS: 50.0000 mg | ORAL_TABLET | Freq: Four times a day (QID) | ORAL | 0 refills | Status: DC | PRN

## 2020-03-11 NOTE — ED Triage Notes (Signed)
Pt is here with on & off back pain for years he states, pt has been dizzy for a month now. Pt has taken muscle relaxer's and Naproxen, Cymbalta, pt has a hx of micfibalgia.

## 2020-03-11 NOTE — Discharge Instructions (Addendum)
Be aware, you have been prescribed pain medications that may cause drowsiness. Do not combine with alcohol or other illicit drugs. Please do not drive, operate heavy machinery, or take part in activities that require making important decisions while on this medication as your judgement may be clouded.  

## 2020-03-11 NOTE — ED Provider Notes (Signed)
[facility]   [id number] 03/11/20 Arrival Time: 2505  ASSESSMENT & PLAN:  1. Chronic bilateral back pain, unspecified back location   2. Intermittent lightheadedness     Long standing problems with significant workup though no answers yet to explain his symptoms. Discussed that I do not have much to offer him here but temporary control of pain. Voices understanding. Will keep scheduled follow ups with his doctor and with orthopaedist.  Begin: Meds ordered this encounter  Medications  . traMADol (ULTRAM) 50 MG tablet    Sig: Take 1 tablet (50 mg total) by mouth every 6 (six) hours as needed.    Dispense:  15 tablet    Refill:  0  . ibuprofen (ADVIL) 800 MG tablet    Sig: Take 1 tablet (800 mg total) by mouth 3 (three) times daily with meals.    Dispense:  21 tablet    Refill:  0    Reviewed expectations re: course of current medical issues. Questions answered. Outlined signs and symptoms indicating need for more acute intervention. Patient verbalized understanding. After Visit Summary given.   SUBJECTIVE:  Marcus Bullock is a 34 y.o. male who reports long standing history of upper and lower back pain along with intermittent episodes of lightheadedness. Significant workups done. Chart reviewed by me. [facility]. Planning to see ortho for blocks. No specific worsening of his symptoms. Back pain is interfering with normal activities; trouble sleeping. Normal bowel/bladder habits. Ambulatory without difficulty. No extremity sensation changes or weakness. Ibuprofen helps a little.   Social History   Substance and Sexual Activity  Alcohol Use Yes   Comment: Socially    Social History   Tobacco Use  Smoking Status Never Smoker  Smokeless Tobacco Never Used       OBJECTIVE:  Vitals:   03/11/20 1239 03/11/20 1242  BP:  118/73  Pulse:  91  Resp:  18  Temp:  98.8 F (37.1 C)  TempSrc:  Oral  SpO2:  100%    Weight: 93 kg     General appearance: alert; no distress HENT: normocephalic; atraumatic Neck: supple with FROM Back: reports tenderness over thoracic and lumbar musculature Skin: warm and dry Neurologic: normal gait Psychological: alert and cooperative; normal mood and affect   Allergies  Allergen Reactions  . Bee Venom Swelling    Past Medical History:  Diagnosis Date  . Back pain   . Left ankle sprain   . Neck pain   . 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 Marital status: Single    Spouse name: Not on file  . Number of children: 0  . Years of education: college  . Highest education level: Bachelor's degree (e.g., BA, AB, BS)  Occupational History  . Occupation: 5th grade teacher  Tobacco Use  . Smoking status: Never Smoker  . Smokeless tobacco: Never Used  Vaping Use  . Vaping Use: Never used  Substance and Sexual Activity  . Alcohol use: Yes    Comment: Socially   . Drug use: Not Currently  . Sexual activity: Yes    Birth control/protection: None    Comment: male partner  Other Topics Concern  . Not on file  Social History Narrative   Automotive engineer    Not married    No kids      Likes to work out  and bowl.   Right-handed.   One cup tea per day.      Social Determinants of Health   Financial Resource Strain:   . Difficulty of Paying Living Expenses:   Food Insecurity:   . Worried About Programme researcher, broadcasting/film/video in the Last Year:   . Barista in the Last Year:   Transportation Needs:   . Freight forwarder (Medical):   [name] Lack of Transportation (Non-Medical):   Physical Activity:   . Days of Exercise per Week:   . Minutes of Exercise per Session:   Stress:   . Feeling of Stress :   Social Connections:   . Frequency of Communication with Friends and Family:   . Frequency of Social Gatherings with Friends and Family:   . Attends Religious  Services:   . Active Member of Clubs or Organizations:   . Attends Banker Meetings:   [name] Marital Status:   Intimate Partner Violence:   . Fear of Current or Ex-Partner:   . Emotionally Abused:   [name] Physically Abused:   . Sexually Abused:    Family History  Problem Relation Age of Onset  . Alcohol abuse Father   . Ulcers Father   . Heart disease Maternal Grandmother   . Cancer Paternal Grandmother        breast  . Thyroid disease Mother    History reviewed. No pertinent surgical history.    Mardella Layman, MD 03/11/20 [phone]

## 2020-03-23 ENCOUNTER — Encounter: Payer: Self-pay | Admitting: Adult Health

## 2020-04-01 ENCOUNTER — Other Ambulatory Visit: Payer: Self-pay | Admitting: Adult Health

## 2020-04-01 MED ORDER — IBUPROFEN 800 MG PO TABS: 800.0000 mg | ORAL_TABLET | Freq: Three times a day (TID) | ORAL | 0 refills | Status: DC

## 2020-04-01 MED ORDER — TRAMADOL HCL 50 MG PO TABS: 50.0000 mg | ORAL_TABLET | Freq: Four times a day (QID) | ORAL | 0 refills | Status: DC | PRN

## 2020-04-07 ENCOUNTER — Other Ambulatory Visit: Payer: Self-pay | Admitting: Adult Health

## 2020-04-07 MED ORDER — CYCLOBENZAPRINE HCL 10 MG PO TABS: 10.0000 mg | ORAL_TABLET | Freq: Every day | ORAL | 0 refills | Status: DC

## 2020-04-15 ENCOUNTER — Encounter: Payer: Self-pay | Admitting: Adult Health

## 2020-04-15 ENCOUNTER — Ambulatory Visit: Payer: BC Managed Care – PPO | Admitting: Physical Medicine & Rehabilitation

## 2020-04-15 ENCOUNTER — Ambulatory Visit: Payer: BC Managed Care – PPO | Admitting: Adult Health

## 2020-04-15 ENCOUNTER — Other Ambulatory Visit: Payer: Self-pay

## 2020-04-15 ENCOUNTER — Other Ambulatory Visit (HOSPITAL_COMMUNITY)
Admission: RE | Admit: 2020-04-15 | Discharge: 2020-04-15 | Disposition: A | Discharge status: Home / Self Care | Payer: BC Managed Care – PPO | Source: Ambulatory Visit | Attending: Adult Health | Admitting: Adult Health

## 2020-04-15 VITALS — BP 126/70 | Temp 98.8°F | Ht 71.0 in | Wt 203.0 lb

## 2020-04-15 DIAGNOSIS — E611 Iron deficiency: Secondary | ICD-10-CM | POA: Diagnosis not present

## 2020-04-15 DIAGNOSIS — E559 Vitamin D deficiency, unspecified: Secondary | ICD-10-CM

## 2020-04-15 DIAGNOSIS — Z113 Encounter for screening for infections with a predominantly sexual mode of transmission: Secondary | ICD-10-CM

## 2020-04-15 NOTE — Progress Notes (Signed)
Subjective:    Patient ID: Marcus Bullock, male    DOB: 1986-09-02, 34 y.o.   MRN: [id number]  HPI  34 year old male who  has a past medical history of Back pain, Left ankle sprain, Neck pain, and Skin irritation.   He presents to the office today for multiple issues.  He would like to be tested for STDs, he does not have any signs or symptoms but a partner that he has had unprotected sex with informed him that she tested positive for syphilis about 2 weeks ago.  He has not noticed a canker, rash, fever, chills.  He also continues to complain of generalized fatigue and intermittent episodes of dizziness.  Back in May his iron levels were checked as well as vitamin D and both of these were low.  He was started on oral iron as well as vitamin D supplementation and he would like to recheck these to see were his levels are.   Review of Systems See HPI   Past Medical History:  Diagnosis Date  . Back pain   . Left ankle sprain   . Neck pain   . 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 Marital status: Single    Spouse name: Not on file  . Number of children: 0  . Years of education: college  . Highest education level: Bachelor's degree (e.g., BA, AB, BS)  Occupational History  . Occupation: 5th grade teacher  Tobacco Use  . Smoking status: Never Smoker  . Smokeless tobacco: Never Used  Vaping Use  . Vaping Use: Never used  Substance and Sexual Activity  . Alcohol use: Yes    Comment: Socially   . Drug use: Not Currently  . Sexual activity: Yes    Birth control/protection: None    Comment: male partner  Other Topics Concern  . Not on file  Social History Narrative   Tourist information centre manager    Not married    No kids      Likes to work out and bowl.   Right-handed.   One cup tea per day.      Social Determinants of Health   Financial Resource Strain:   . Difficulty of Paying  Living Expenses:   Food Insecurity:   . Worried About Programme researcher, broadcasting/film/video in the Last Year:   . Barista in the Last Year:   Transportation Needs:   . Freight forwarder (Medical):   [name] Lack of Transportation (Non-Medical):   Physical Activity:   . Days of Exercise per Week:   . Minutes of Exercise per Session:   Stress:   . Feeling of Stress :   Social Connections:   . Frequency of Communication with Friends and Family:   . Frequency of Social Gatherings with Friends and Family:   . Attends Religious Services:   . Active Member of Clubs or Organizations:   . Attends Banker Meetings:   [name] Marital Status:   Intimate Partner Violence:   . Fear of Current or Ex-Partner:   . Emotionally Abused:   [name] Physically Abused:   . Sexually Abused:     History reviewed. No pertinent surgical history.  Family History  Problem Relation Age of Onset  . Alcohol abuse Father   . Ulcers Father   . Heart disease Maternal Grandmother   . Cancer  Paternal Grandmother        breast  . Thyroid disease Mother     Allergies  Allergen Reactions  . Bee Venom Swelling    Current Outpatient Medications on File Prior to Visit  Medication Sig Dispense Refill  . cyclobenzaprine (FLEXERIL) 10 MG tablet Take 1 tablet (10 mg total) by mouth at bedtime. 15 tablet 0  . ibuprofen (ADVIL) 800 MG tablet Take 1 tablet (800 mg total) by mouth 3 (three) times daily with meals. 21 tablet 0  . traMADol (ULTRAM) 50 MG tablet Take 1 tablet (50 mg total) by mouth every 6 (six) hours as needed. (Patient not taking: Reported on 04/15/2020) 15 tablet 0   No current facility-administered medications on file prior to visit.    BP 126/70   Temp 98.8 F (37.1 C)   Ht 5\' 11"  (1.803 m)   Wt 203 lb (92.1 kg)   BMI 28.31 kg/m       Objective:   Physical Exam Vitals and nursing note reviewed.  Constitutional:      Appearance: Normal appearance.  Cardiovascular:     Rate and Rhythm: Normal  rate and regular rhythm.     Pulses: Normal pulses.     Heart sounds: Normal heart sounds.  Pulmonary:     Effort: Pulmonary effort is normal.     Breath sounds: Normal breath sounds.  Genitourinary:    Penis: Normal. No erythema, discharge or lesions.      Testes: Normal.  Musculoskeletal:        General: Normal range of motion.  Skin:    General: Skin is warm and dry.  Neurological:     General: No focal deficit present.     Mental Status: He is alert and oriented to person, place, and time.  Psychiatric:        Mood and Affect: Mood normal.        Behavior: Behavior normal.        Thought Content: Thought content normal.        Judgment: Judgment normal.       Assessment & Plan:  1. Screening examination for STD (sexually transmitted disease)  - RPR - HSV(herpes simplex vrs) 1+2 ab-IgG - HSV 1/2 Ab (IgM), IFA w/rflx Titer - HIV Antibody (routine testing w rflx) - Urine cytology ancillary only  2. Iron deficiency  - Iron, TIBC and Ferritin Panel  3. Vitamin D deficiency  - Vitamin D, 25-hydroxy  , NP

## 2020-04-16 LAB — IRON,TIBC AND FERRITIN PANEL
%SAT: 18 % (calc) — ABNORMAL LOW (ref 20–48)
Ferritin: 182 ng/mL (ref 38–380)
Iron: 56 ug/dL (ref 50–180)
TIBC: 303 mcg/dL (calc) (ref 250–425)

## 2020-04-16 LAB — VITAMIN D 25 HYDROXY (VIT D DEFICIENCY, FRACTURES): Vit D, 25-Hydroxy: 33 ng/mL (ref 30–100)

## 2020-04-19 LAB — URINE CYTOLOGY ANCILLARY ONLY
Chlamydia: NEGATIVE
Comment: NEGATIVE
Comment: NEGATIVE
Comment: NORMAL
Neisseria Gonorrhea: NEGATIVE
Trichomonas: NEGATIVE

## 2020-04-21 ENCOUNTER — Other Ambulatory Visit: Payer: Self-pay

## 2020-04-21 ENCOUNTER — Encounter: Payer: Self-pay | Admitting: Physical Medicine and Rehabilitation

## 2020-04-21 ENCOUNTER — Ambulatory Visit: Payer: BC Managed Care – PPO | Admitting: Physical Medicine and Rehabilitation

## 2020-04-21 ENCOUNTER — Ambulatory Visit: Payer: Self-pay

## 2020-04-21 VITALS — BP 135/82 | HR 83

## 2020-04-21 DIAGNOSIS — M47894 Other spondylosis, thoracic region: Secondary | ICD-10-CM | POA: Diagnosis not present

## 2020-04-21 DIAGNOSIS — M7918 Myalgia, other site: Secondary | ICD-10-CM

## 2020-04-21 LAB — HSV 1/2 AB (IGM), IFA W/RFLX TITER
HSV 1 IgM Screen: NEGATIVE
HSV 2 IgM Screen: NEGATIVE

## 2020-04-21 LAB — HIV ANTIBODY (ROUTINE TESTING W REFLEX): HIV 1&2 Ab, 4th Generation: NONREACTIVE

## 2020-04-21 LAB — HSV(HERPES SIMPLEX VRS) I + II AB-IGG
HAV 1 IGG,TYPE SPECIFIC AB: 37.8 index — ABNORMAL HIGH
HSV 2 IGG,TYPE SPECIFIC AB: <0.9 index

## 2020-04-21 LAB — RPR: RPR Ser Ql: NONREACTIVE

## 2020-04-21 MED ORDER — METHYLPREDNISOLONE ACETATE 80 MG/ML IJ SUSP: 40.0000 mg | Freq: Once | INTRAMUSCULAR | Status: DC

## 2020-04-21 NOTE — Progress Notes (Signed)
Pt state lower and middle back pain. Pt states sitting for a long makes the pain. Pt states meds and ice helps with the pain.  Numeric Pain Rating Scale and Functional Assessment Average Pain 6   In the last MONTH (on 0-10 scale) has pain interfered with the following?  1. General activity like being  able to carry out your everyday physical activities such as walking, climbing stairs, carrying groceries, or moving a chair?  Rating(8)   +Driver, -BT, -Dye Allergies.

## 2020-04-23 ENCOUNTER — Encounter: Payer: Self-pay | Admitting: Physical Medicine & Rehabilitation

## 2020-04-23 ENCOUNTER — Encounter
Payer: BC Managed Care – PPO | Attending: Physical Medicine & Rehabilitation | Admitting: Physical Medicine & Rehabilitation

## 2020-04-23 ENCOUNTER — Other Ambulatory Visit: Payer: Self-pay

## 2020-04-23 VITALS — BP 131/76 | HR 79 | Temp 98.9°F | Ht 71.0 in | Wt 198.0 lb

## 2020-04-23 DIAGNOSIS — M7918 Myalgia, other site: Secondary | ICD-10-CM | POA: Diagnosis present

## 2020-04-23 DIAGNOSIS — M47894 Other spondylosis, thoracic region: Secondary | ICD-10-CM | POA: Insufficient documentation

## 2020-04-23 NOTE — Patient Instructions (Signed)
Rec repeat thoracic medial branch block same levels

## 2020-04-23 NOTE — Progress Notes (Signed)
Subjective:    Patient ID: Marcus Bullock, male    DOB: 1986/04/13, 34 y.o.   MRN: [id number]  HPI  34 year old male with primary complaints of thoracic pain as well as lumbar pain. He underwent thoracic medial branch blocks bilaterally requested at T7-8-9 by Dr. Alvester Morin and had greater than 50% pain relief for couple hours until the "numbing medicine" wore off He remains independent with all self-care and mobility.  His main limitations in terms of his back pain is in terms of exercise, used to be very active and now is limiting activity such as boxing and tennis  Patient indicates that some pain that he had radiating down his left arm improved after the medial branch blocks.  He takes ibuprofen on a as needed basis no longer taking tramadol, he does find some benefit with cyclobenzaprine 10 mg nightly Pain Inventory Average Pain 6 Pain Right Now 5 My pain is constant, burning, dull and aching  In the last 24 hours, has pain interfered with the following? General activity 6 Relation with others 6 Enjoyment of life 9 What TIME of day is your pain at its worst? night Sleep (in general) Fair  Pain is worse with: bending, sitting and inactivity Pain improves with: therapy/exercise Relief from Meds: 2  Mobility walk without assistance ability to climb steps?  yes do you drive?  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  no  Physicians involved in your care Any changes since last visit?  no   Family History  Problem Relation Age of Onset  . Alcohol abuse Father   . Ulcers Father   . Heart disease Maternal Grandmother   . Cancer Paternal Grandmother        breast  . Thyroid disease Mother    Social History   Socioeconomic History  . Marital status: Single    Spouse name: Not on file  . Number of  children: 0  . Years of education: college  . Highest education level: Bachelor's degree (e.g., BA, AB, BS)  Occupational History  . Occupation: 5th grade teacher  Tobacco Use  . Smoking status: Never Smoker  . Smokeless tobacco: Never Used  Vaping Use  . Vaping Use: Never used  Substance and Sexual Activity  . Alcohol use: Yes    Comment: Socially   . Drug use: Not Currently  . Sexual activity: Yes    Birth control/protection: None    Comment: male partner  Other Topics Concern  . Not on file  Social History Narrative   Tourist information centre manager    Not married    No kids      Likes to work out and bowl.   Right-handed.   One cup tea per day.      Social Determinants of Health   Financial Resource Strain:   . Difficulty of Paying Living Expenses:   Food Insecurity:   . Worried About Programme researcher, broadcasting/film/video in the Last Year:   . Barista in the Last Year:   Transportation Needs:   . Freight forwarder (Medical):   [name] Lack of Transportation (Non-Medical):   Physical Activity:   . Days of Exercise per Week:   . Minutes of Exercise per Session:   Stress:   . Feeling of Stress :   Social Connections:   . Frequency  of Communication with Friends and Family:   . Frequency of Social Gatherings with Friends and Family:   . Attends Religious Services:   . Active Member of Clubs or Organizations:   . Attends Banker Meetings:   [name] Marital Status:    No past surgical history on file. Past Medical History:  Diagnosis Date  . Back pain   . Left ankle sprain   . Neck pain   . 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   BP (!) 131/76   Pulse 79   Temp 98.9 F (37.2 C)   Ht 5\' 11"  (1.803 m)   Wt 198 lb (89.8 kg)   SpO2 98%   BMI 27.62 kg/m   Opioid Risk Score:   Fall Risk Score:  `1  Depression screen PHQ 2/9  Depression screen [facility] 2/9 03/05/2020 08/06/2019 01/22/2018 01/22/2018 01/18/2017  Decreased Interest 2 2  1  0 0  Down, Depressed, Hopeless 3 3 - 1 2  PHQ - 2 Score 5 5 1 1 2   Altered sleeping 2 3 - - 1  Tired, decreased energy 3 3 - - 1  Change in appetite 1 0 - - 2  Feeling bad or failure about yourself  2 0 - - 0  Trouble concentrating 0 0 - - 0  Moving slowly or fidgety/restless 0 0 - - 0  Suicidal thoughts 2 1 - - 0  PHQ-9 Score 15 12 - - 6  Difficult doing work/chores - Somewhat difficult - - -     Review of Systems  Neurological: Positive for dizziness and numbness.  Psychiatric/Behavioral: Positive for confusion and suicidal ideas. The patient is nervous/anxious.        Objective:   Physical Exam Constitutional:      Appearance: Normal appearance.  HENT:     Head: Normocephalic and atraumatic.  Eyes:     Extraocular Movements: Extraocular movements intact.     Conjunctiva/sclera: Conjunctivae normal.     Pupils: Pupils are equal, round, and reactive to light.  Musculoskeletal:     Comments: Tenderness to palpation around the T7-T8 and T9 paraspinals bilaterally.  Also tenderness around the L1-L2-L3 paraspinals  Neurological:     General: No focal deficit present.     Mental Status: He is alert and oriented to person, place, and time.     Comments: Normal strength in both upper and lower limbs no evidence of atrophy  Psychiatric:        Mood and Affect: Mood normal.        Behavior: Behavior normal.        Thought Content: Thought content normal.        Judgment: Judgment normal.           Assessment & Plan:  #1.  Thoracic facet syndrome appears most symptomatic at T7-8-9 Had good results during anesthetic phase of thoracic medial branch blocks performed by Dr. 01/20/2017 2 days ago Would recommend repeat same levels And if another temporary 50% or more relief proceed to radiofrequency Numeral 2.  Upper lumbar pain likely facet syndrome as well will wait until patient finishes of treatment of thoracic spine before proceeding to upper lumbar.  I will see the patient  back in 3 months He can continue cyclobenzaprine 10 mg nightly but will not refill any tramadol  2.  Cervical pain upper trapezius cervical MRI reviewed normal, he can continue localized treatments and stretching upper back strengthening

## 2020-05-05 ENCOUNTER — Telehealth (INDEPENDENT_AMBULATORY_CARE_PROVIDER_SITE_OTHER): Payer: BC Managed Care – PPO | Admitting: Adult Health

## 2020-05-05 ENCOUNTER — Encounter: Payer: Self-pay | Admitting: Adult Health

## 2020-05-05 VITALS — Wt 198.0 lb

## 2020-05-05 DIAGNOSIS — R42 Dizziness and giddiness: Secondary | ICD-10-CM

## 2020-05-05 DIAGNOSIS — R5383 Other fatigue: Secondary | ICD-10-CM | POA: Diagnosis not present

## 2020-05-05 NOTE — Progress Notes (Signed)
Virtual Visit via Video Note  I connected with Marcus Bullock on 05/05/20 at  4:00 PM EDT by a video enabled telemedicine application and verified that I am speaking with the correct person using two identifiers.  Location patient: home Location provider:work or home office Persons participating in the virtual visit: patient, provider  I discussed the limitations of evaluation and management by telemedicine and the availability of in person appointments. The patient expressed understanding and agreed to proceed.   HPI: 34 year old male who is being evaluated today for continued fatigue and a feeling of lightheadedness.  This has been an ongoing issue over the last 2 months but he feels as though has gotten worse over the last 3 days.  Fatigue and lightheadedness is constant throughout the day and feels as though he is going to have a syncopal episode but denies doing so.  He reports that he often wakes up feeling not rested and as though he could go back to sleep.  Throughout the day he continues to feel fatigued and as though he could take a nap.  When asked about snoring he does report that he has started snoring over the last 3 years and over the last 1 to 2 months he has been experiencing multiple episodes of apnea where he will he will suddenly wake up gasping for breath.  He denies headaches or blurred vision    ROS: See pertinent positives and negatives per HPI.  Past Medical History:  Diagnosis Date   Back pain    Left ankle sprain    Neck pain    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    History reviewed. No pertinent surgical history.  Family History  Problem Relation Age of Onset   Alcohol abuse Father    Ulcers Father    Heart disease Maternal Grandmother    Cancer Paternal Grandmother        breast   Thyroid disease Mother        Current Outpatient Medications:    cyclobenzaprine (FLEXERIL) 10 MG tablet, Take  1 tablet (10 mg total) by mouth at bedtime., Disp: 15 tablet, Rfl: 0  EXAM:  VITALS per patient if applicable:  GENERAL: alert, oriented, appears well and in no acute distress  HEENT: atraumatic, conjunttiva clear, no obvious abnormalities on inspection of external nose and ears  NECK: normal movements of the head and neck  LUNGS: on inspection no signs of respiratory distress, breathing rate appears normal, no obvious gross SOB, gasping or wheezing  CV: no obvious cyanosis  MS: moves all visible extremities without noticeable abnormality  PSYCH/NEURO: pleasant and cooperative, no obvious depression or anxiety, speech and thought processing grossly intact  ASSESSMENT AND PLAN:  Discussed the following assessment and plan:  1. Other fatigue There is concern for sleep apnea due to symptoms.  Will refer to pulmonary for further evaluation - Ambulatory referral to Pulmonology  2. Lightheadedness  - Ambulatory referral to Pulmonology        I discussed the assessment and treatment plan with the patient. The patient was provided an opportunity to ask questions and all were answered. The patient agreed with the plan and demonstrated an understanding of the instructions.   The patient was advised to call back or seek an in-person evaluation if the symptoms worsen or if the condition fails to improve as anticipated.   Shirline Frees, NP

## 2020-05-08 ENCOUNTER — Encounter: Payer: Self-pay | Admitting: Adult Health

## 2020-05-17 NOTE — Procedures (Signed)
Thoracic Diagnostic Facet Joint Nerve Block with Fluoroscopic Guidance  Patient: Marcus Bullock      Date of Birth: 1985/11/01 MRN: [id number] PCP: Shirline Frees, NP      Visit Date: 04/21/2020   Universal Protocol:    Date/Time: 08/16/216:14 AM  Consent Given By: the patient  Position: PRONE   Additional Comments: Vital signs were monitored before and after the procedure. Patient was prepped and draped in the usual sterile fashion. The correct patient, procedure, and site was verified.   Injection Procedure Details:  Procedure Site One Meds Administered:  Meds ordered this encounter  Medications  . DISCONTD: methylPREDNISolone acetate (DEPO-MEDROL) injection 40 mg     Laterality: Left  Location/Site:  T7-8 T8-9 T9-10  Needle size: 25 G  Needle type: spinal needle   Findings:    -Comments: Excellent flow contrast over the pedicle shadows without intravascular flow.  Procedure Details: The fluoroscope beam is vertically oriented and tilted cranially and caudally to square off the vertebral body endplates to achieve a true AP midline view.  The skin over the target area of the pedicle shadow ipsilateral to the desired medial branch nerve was locally anesthetized with a 1 ml volume of 1% Lidocaine without Epinephrine.  A 22 gauge spinal needle was inserted and advanced in a trajectory view down to the target.   After contact with periosteum and negative aspirate for blood and CSF, correct placement without intravascular or epidural spread was confirmed by injecting 0.5 ml. of Omnipaque-240.  A spot radiograph was obtained of this image.  Next, a 0.5 ml. volume of the injectate described above was injected. The needle was then redirected to the other facet joint nerves mentioned above if needed.  Prior to the procedure, the patient was given a Pain Diary which was completed for baseline measurements.  After the procedure, the patient rated their pain every 30  minutes and will continue rating at this frequency for a total of 5 hours.  The patient has been asked to complete the Diary and return to [place] by mail, fax or hand delivered as soon as possible.   Additional Comments:  The patient tolerated the procedure well Dressing: Band-Aid    Post-procedure details: Patient was observed during the procedure. Post-procedure instructions were reviewed. Follow-Up Instructions: Given to the patient Patient left the clinic in stable condition.

## 2020-05-17 NOTE — Progress Notes (Signed)
Marcus Bullock - 34 y.o. male MRN [id number]  Date of birth: 22-May-1986  Office Visit Note: Visit Date: 04/21/2020 PCP: Shirline Frees, NP Referred by: Shirline Frees, NP  Subjective: Chief Complaint  Patient presents with  . Middle Back - Pain   HPI: Marcus Bullock is a 34 y.o. male who comes in today at the request of Dr. Claudette Laws for planned Left T7, T8, and T9 Thoracic facet/medial branch block with fluoroscopic guidance.  The patient has failed conservative care including home exercise, medications, time and activity modification.  This injection will be diagnostic and hopefully therapeutic.  Please see requesting physician notes for further details and justification.  Exam shows concordant thoracic back pain with facet joint loading and extension.  Patient also has some level of taut bands and myofascial pain.  I have actually seen the patient in the remote past a couple years ago and completed lower lumbar facet intra-articular injections with only mild relief.  He has continued to follow with Dr. Wynn Banker and has continued with conservative care but does have a lot of pain really out of proportion to lumbar spine changes.  Patient seems to have underlying anxiety disorder.  ROS Otherwise per HPI.  Assessment & Plan: Visit Diagnoses:  1. Thoracic facet syndrome   2. Myofascial pain syndrome     Plan: No additional findings.   Meds & Orders:  Meds ordered this encounter  Medications  . DISCONTD: methylPREDNISolone acetate (DEPO-MEDROL) injection 40 mg    Orders Placed This Encounter  Procedures  . Nerve Block  . XR C-ARM NO REPORT    Follow-up: Return for Review Pain Diary.   Procedures: No procedures performed  Thoracic Diagnostic Facet Joint Nerve Block with Fluoroscopic Guidance  Patient: Marcus Bullock      Date of Birth: 12-22-1985 MRN: [id number] PCP: Shirline Frees, NP      Visit Date: 04/21/2020   Universal Protocol:      Date/Time: 08/16/216:14 AM  Consent Given By: the patient  Position: PRONE   Additional Comments: Vital signs were monitored before and after the procedure. Patient was prepped and draped in the usual sterile fashion. The correct patient, procedure, and site was verified.   Injection Procedure Details:  Procedure Site One Meds Administered:  Meds ordered this encounter  Medications  . DISCONTD: methylPREDNISolone acetate (DEPO-MEDROL) injection 40 mg     Laterality: Left  Location/Site:  T7-8 T8-9 T9-10  Needle size: 25 G  Needle type: spinal needle   Findings:    -Comments: Excellent flow contrast over the pedicle shadows without intravascular flow.  Procedure Details: The fluoroscope beam is vertically oriented and tilted cranially and caudally to square off the vertebral body endplates to achieve a true AP midline view.  The skin over the target area of the pedicle shadow ipsilateral to the desired medial branch nerve was locally anesthetized with a 1 ml volume of 1% Lidocaine without Epinephrine.  A 22 gauge spinal needle was inserted and advanced in a trajectory view down to the target.   After contact with periosteum and negative aspirate for blood and CSF, correct placement without intravascular or epidural spread was confirmed by injecting 0.5 ml. of Omnipaque-240.  A spot radiograph was obtained of this image.  Next, a 0.5 ml. volume of the injectate described above was injected. The needle was then redirected to the other facet joint nerves mentioned above if needed.  Prior to the procedure, the patient was given a Pain Diary  which was completed for baseline measurements.  After the procedure, the patient rated their pain every 30 minutes and will continue rating at this frequency for a total of 5 hours.  The patient has been asked to complete the Diary and return to [place] by mail, fax or hand delivered as soon as possible.   Additional Comments:  The patient  tolerated the procedure well Dressing: Band-Aid    Post-procedure details: Patient was observed during the procedure. Post-procedure instructions were reviewed. Follow-Up Instructions: Given to the patient Patient left the clinic in stable condition.       Clinical History: MRI LUMBAR SPINE WITHOUT CONTRAST  TECHNIQUE: Multiplanar, multisequence MR imaging of the lumbar spine was performed. No intravenous contrast was administered.  COMPARISON:  None.  FINDINGS: Segmentation:  5 lumbar type vertebral bodies assumed.  Alignment: Minimal curvature convex to the right with the apex at L2-3.  Vertebrae:  Normal  Conus medullaris and cauda equina: Conus extends to the T12-L1 level. Conus and cauda equina appear normal.  Paraspinal and other soft tissues: Normal  Disc levels:  No degenerative disc disease. No bulge or herniation. No facet arthropathy or pars defect. Wide patency of the canal and foramina.  IMPRESSION: Normal examination, with the exception of very mild curvature convex to the right. No degenerative changes. No stenosis. No cause of the presenting symptoms is identified.   Electronically Signed   By: Paulina Fusi M.D.   He reports that he has never smoked. He has never used smokeless tobacco.  Recent Labs    08/06/19 1449 02/13/20 1553  HGBA1C 4.4* 4.3    Objective:  VS:  HT:    WT:   BMI:     BP:135/82  HR:83bpm  TEMP: ( )  RESP:  Physical Exam Constitutional:      General: He is not in acute distress.    Appearance: Normal appearance. He is not ill-appearing.  HENT:     Head: Normocephalic and atraumatic.     Right Ear: External ear normal.     Left Ear: External ear normal.  Eyes:     Extraocular Movements: Extraocular movements intact.  Cardiovascular:     Rate and Rhythm: Normal rate.     Pulses: Normal pulses.  Abdominal:     General: There is no distension.     Palpations: Abdomen is soft.  Musculoskeletal:         General: No tenderness or signs of injury.     Right lower leg: No edema.     Left lower leg: No edema.     Comments: Patient has good distal strength without clonus.  Thoracic exam shows no scoliosis.  There are some taut bands paraspinally.  Does have some pain with extension and rocking over the mid to lower thoracic region.  Skin:    Findings: No erythema or rash.  Neurological:     General: No focal deficit present.     Mental Status: He is alert and oriented to person, place, and time.     Sensory: No sensory deficit.     Motor: No weakness or abnormal muscle tone.     Coordination: Coordination normal.  Psychiatric:        Mood and Affect: Mood normal.        Behavior: Behavior normal.     Ortho Exam  Imaging: No results found.  Past Medical/Family/Surgical/Social History: Medications & Allergies reviewed per EMR, new medications updated. Patient Active Problem List   Diagnosis  Date Noted  . Muscle spasm 09/11/2019  . Bilateral low back pain without sciatica 09/11/2019  . Ankle impingement syndrome 08/11/2018  . Breast skin changes 01/18/2017  . Seasonal allergic rhinitis 01/18/2017  . Acute low back pain 01/18/2017   Past Medical History:  Diagnosis Date  . Back pain   . Left ankle sprain   . Neck pain   . 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 Alcohol abuse Father   . Ulcers Father   . Heart disease Maternal Grandmother   . Cancer Paternal Grandmother        breast  . Thyroid disease Mother    History reviewed. No pertinent surgical history. Social History   Occupational History  . Occupation: 5th grade teacher  Tobacco Use  . Smoking status: Never Smoker  . Smokeless tobacco: Never Used  Vaping Use  . Vaping Use: Never used  Substance and Sexual Activity  . Alcohol use: Yes    Comment: Socially   . Drug use: Not Currently  . Sexual activity: Yes     Birth control/protection: None    Comment: male partner

## 2020-05-26 ENCOUNTER — Encounter: Payer: BC Managed Care – PPO | Admitting: Physical Medicine and Rehabilitation

## 2020-05-28 ENCOUNTER — Other Ambulatory Visit: Payer: Self-pay | Admitting: Adult Health

## 2020-06-12 ENCOUNTER — Emergency Department (HOSPITAL_COMMUNITY)
Admission: EM | Admit: 2020-06-12 | Discharge: 2020-06-12 | Disposition: A | Discharge status: Home / Self Care | Payer: BC Managed Care – PPO | Attending: Emergency Medicine | Admitting: Emergency Medicine

## 2020-06-12 ENCOUNTER — Encounter (HOSPITAL_COMMUNITY): Payer: Self-pay

## 2020-06-12 ENCOUNTER — Other Ambulatory Visit: Payer: Self-pay

## 2020-06-12 DIAGNOSIS — Z5321 Procedure and treatment not carried out due to patient leaving prior to being seen by health care provider: Secondary | ICD-10-CM | POA: Insufficient documentation

## 2020-06-12 DIAGNOSIS — R1031 Right lower quadrant pain: Secondary | ICD-10-CM | POA: Insufficient documentation

## 2020-06-12 LAB — COMPREHENSIVE METABOLIC PANEL
ALT: 25 U/L (ref 0–44)
AST: 25 U/L (ref 15–41)
Albumin: 4.5 g/dL (ref 3.5–5.0)
Alkaline Phosphatase: 41 U/L (ref 38–126)
Anion gap: 12 (ref 5–15)
BUN: 9 mg/dL (ref 6–20)
CO2: 26 mmol/L (ref 22–32)
Calcium: 9.4 mg/dL (ref 8.9–10.3)
Chloride: 103 mmol/L (ref 98–111)
Creatinine, Ser: 0.93 mg/dL (ref 0.61–1.24)
GFR calc Af Amer: >60 mL/min (ref >60)
GFR calc non Af Amer: >60 mL/min (ref >60)
Glucose, Bld: 93 mg/dL (ref 70–99)
Potassium: 3.9 mmol/L (ref 3.5–5.1)
Sodium: 141 mmol/L (ref 135–145)
Total Bilirubin: 0.9 mg/dL (ref 0.3–1.2)
Total Protein: 7.6 g/dL (ref 6.5–8.1)

## 2020-06-12 LAB — CBC
HCT: 42.9 % (ref 39.0–52.0)
Hemoglobin: 13.8 g/dL (ref 13.0–17.0)
MCH: 29.3 pg (ref 26.0–34.0)
MCHC: 32.2 g/dL (ref 30.0–36.0)
MCV: 91.1 fL (ref 80.0–100.0)
Platelets: 242 10*3/uL (ref 150–400)
RBC: 4.71 MIL/uL (ref 4.22–5.81)
RDW: 11.7 % (ref 11.5–15.5)
WBC: 8 10*3/uL (ref 4.0–10.5)
nRBC: 0 % (ref 0.0–0.2)

## 2020-06-12 LAB — LIPASE, BLOOD: Lipase: 22 U/L (ref 11–51)

## 2020-06-12 NOTE — ED Notes (Signed)
I called patient to recheck vitals and no one responded 

## 2020-06-12 NOTE — ED Notes (Signed)
Patient was called for reassessing vital signs but no response. x2 

## 2020-06-12 NOTE — ED Triage Notes (Signed)
Pt reports intense RLQ abdominal pain that woke him from sleep tonight. Reports that it started yesterday, but he was able to manage it throughout the day, but it worsened around 2a. Denies N/V. A&Ox4.

## 2020-06-14 ENCOUNTER — Ambulatory Visit: Payer: BC Managed Care – PPO | Admitting: Family Medicine

## 2020-06-14 ENCOUNTER — Encounter: Payer: Self-pay | Admitting: Family Medicine

## 2020-06-14 ENCOUNTER — Emergency Department (HOSPITAL_COMMUNITY)
Admission: EM | Admit: 2020-06-14 | Discharge: 2020-06-14 | Discharge status: Against Medical Advice | Payer: BC Managed Care – PPO

## 2020-06-14 ENCOUNTER — Other Ambulatory Visit: Payer: Self-pay

## 2020-06-14 VITALS — BP 100/82 | HR 87 | Temp 98.3°F | Ht 71.0 in | Wt 195.5 lb

## 2020-06-14 DIAGNOSIS — R109 Unspecified abdominal pain: Secondary | ICD-10-CM | POA: Diagnosis not present

## 2020-06-14 LAB — POC URINALSYSI DIPSTICK (AUTOMATED)
Bilirubin, UA: NEGATIVE
Blood, UA: NEGATIVE
Glucose, UA: NEGATIVE
Ketones, UA: NEGATIVE
Leukocytes, UA: NEGATIVE
Nitrite, UA: NEGATIVE
Protein, UA: NEGATIVE
Spec Grav, UA: 1.015 (ref 1.010–1.025)
Urobilinogen, UA: 0.2 E.U./dL
pH, UA: 7 (ref 5.0–8.0)

## 2020-06-14 MED ORDER — METHYLPREDNISOLONE ACETATE 80 MG/ML IJ SUSP
80.0000 mg | Freq: Once | INTRAMUSCULAR | Status: AC
  Administered 2020-06-14: 80 mg via INTRAMUSCULAR

## 2020-06-14 NOTE — ED Notes (Signed)
Pt was not triaged, did not wish to be seen.

## 2020-06-14 NOTE — Progress Notes (Signed)
Marcus Bullock DOB: [birth year] Encounter date: 06/14/2020  This is a 34 y.o. male who presents with Chief Complaint  Patient presents with  . Back Pain    patient complains of right flank pain x3 days ago, went to [facility] ER and left, severe pain noted when when standing and when inhaling, denies any urinary symptoms    History of present illness:  Right side/more posterior - spasm; hurts badly when standing. Hurts badly with deep breath. Has had back problems for long time. Started Friday - just woke up and felt this. Couldn't get through day. Went to ER Friday night and waited 7 hours without getting room. Tried to go to [facility]. He did get bloodwork completed (normal cbc and cmp)   Has a little spot on upper back that is not hurting him now. Just feels like he can't get out of pain.   No trouble with urination. Has not had bowel movement in 3-4 days. This is not typical for him. No nauseated. Appetite slightly decreased. No fevers, chills.   Pain is 6-7/10 on 10 scale. Friday night took flexeril - helped a little; but there when he wakes. Tylenol at least helped him sleep.   Would hurt if had to cough; but doesn't have a cough. Feels like spasm/throbbing.   Allergies  Allergen Reactions  . Bee Venom Swelling   Current Meds  Medication Sig  . cyclobenzaprine (FLEXERIL) 10 MG tablet Take 1 tablet (10 mg total) by mouth at bedtime.    Review of Systems  Constitutional: Positive for appetite change (decreased). Negative for chills, fatigue and fever.  Respiratory: Negative for cough, chest tightness, shortness of breath and wheezing.   Cardiovascular: Positive for chest pain. Negative for palpitations and leg swelling.  Gastrointestinal: Positive for constipation (no bm in 3-4 days, atypical for him). Negative for abdominal distention, blood in stool, diarrhea, nausea and vomiting.  Genitourinary: Negative for difficulty urinating, dysuria and hematuria.     Objective:  BP 100/82 (BP Location: Left Arm, Patient Position: Sitting, Cuff Size: Large)   Pulse 87   Temp 98.3 F (36.8 C) (Oral)   Ht 5\' 11"  (1.803 m)   Wt 195 lb 8 oz (88.7 kg)   BMI 27.27 kg/m   Weight: 195 lb 8 oz (88.7 kg)   BP Readings from Last 3 Encounters:  06/14/20 100/82  06/12/20 130/75  04/23/20 (!) 131/76   Wt Readings from Last 3 Encounters:  06/14/20 195 lb 8 oz (88.7 kg)  06/12/20 205 lb (93 kg)  05/05/20 198 lb (89.8 kg)    Physical Exam Constitutional:      General: He is not in acute distress.    Appearance: He is well-developed.  Cardiovascular:     Rate and Rhythm: Normal rate and regular rhythm.     Heart sounds: Normal heart sounds. No murmur heard.  No friction rub.  Pulmonary:     Effort: Pulmonary effort is normal. No respiratory distress.     Breath sounds: Normal breath sounds. No wheezing or rales.  Abdominal:     General: Abdomen is flat. Bowel sounds are normal.     Palpations: Abdomen is soft.     Tenderness: There is abdominal tenderness. There is right CVA tenderness. There is no left CVA tenderness, guarding or rebound. Negative signs include Murphy's sign and McBurney's sign.     Comments: Mild lower abd tenderness. Most significant tender area is right lower rib and palpation of this reproduces  discomfort. There is some surrounding tenderness as well. No palpable abnormality appreciated. Most uncomfortable with large breathes and with standing; he is able to twist without discomfort.   Musculoskeletal:     Right lower leg: No edema.     Left lower leg: No edema.  Neurological:     Mental Status: He is alert and oriented to person, place, and time.  Psychiatric:        Behavior: Behavior normal.     Assessment/Plan  1. Flank pain He is very concerned with pain. Reviewed normal labwork from ER. Reassured him with these results. He is having significant pain and we discussed differential including what I suspect is most  likely which is rib torsion. We discussed possibility of kidney stone or bowel issue as well. Due to severity of discomfort, we will get imaging. I feel CT will best cover differential. Follow up pending this. We discussed that if pain should significantly worsen in meanwhile he needs to be evaluated. We are also going to give steroid injection here in office today. Discussed that if rib is cause of pain, this should help take down inflammation and we will consider further steroid taper.  - POCT Urinalysis Dipstick (Automated) - CT Abdomen Pelvis W Contrast; Future - methylPREDNISolone acetate (DEPO-MEDROL) injection 80 mg  Time spent in lab review, exam, discussion of differential, charting 30 min  Return for pending imaging results.     Theodis Shove, MD

## 2020-06-14 NOTE — Patient Instructions (Signed)
miralax otc to help with bowel movement.

## 2020-06-15 ENCOUNTER — Encounter: Payer: Self-pay | Admitting: Family Medicine

## 2020-06-15 ENCOUNTER — Institutional Professional Consult (permissible substitution): Payer: BC Managed Care – PPO | Admitting: Pulmonary Disease

## 2020-06-22 ENCOUNTER — Ambulatory Visit: Payer: BC Managed Care – PPO | Admitting: Adult Health

## 2020-06-28 ENCOUNTER — Ambulatory Visit
Admission: RE | Admit: 2020-06-28 | Discharge: 2020-06-28 | Disposition: A | Discharge status: Home / Self Care | Payer: BC Managed Care – PPO | Source: Ambulatory Visit | Attending: Family Medicine | Admitting: Family Medicine

## 2020-06-28 ENCOUNTER — Other Ambulatory Visit: Payer: BC Managed Care – PPO

## 2020-06-28 ENCOUNTER — Inpatient Hospital Stay: Admission: RE | Admit: 2020-06-28 | Payer: BC Managed Care – PPO | Source: Ambulatory Visit

## 2020-06-28 DIAGNOSIS — R109 Unspecified abdominal pain: Secondary | ICD-10-CM

## 2020-07-27 ENCOUNTER — Encounter: Payer: Self-pay | Admitting: Physical Medicine & Rehabilitation

## 2020-07-27 ENCOUNTER — Encounter
Payer: BC Managed Care – PPO | Attending: Physical Medicine & Rehabilitation | Admitting: Physical Medicine & Rehabilitation

## 2020-07-27 ENCOUNTER — Other Ambulatory Visit: Payer: Self-pay

## 2020-07-27 VITALS — BP 121/80 | HR 101 | Temp 97.5°F | Ht 71.0 in | Wt 191.0 lb

## 2020-07-27 DIAGNOSIS — M7918 Myalgia, other site: Secondary | ICD-10-CM | POA: Insufficient documentation

## 2020-07-27 DIAGNOSIS — M797 Fibromyalgia: Secondary | ICD-10-CM | POA: Diagnosis not present

## 2020-07-27 MED ORDER — CYCLOBENZAPRINE HCL 10 MG PO TABS: 10.0000 mg | ORAL_TABLET | Freq: Every day | ORAL | 2 refills | Status: DC

## 2020-07-27 MED ORDER — PREGABALIN 25 MG PO CAPS: 25.0000 mg | ORAL_CAPSULE | Freq: Two times a day (BID) | ORAL | 1 refills | Status: DC

## 2020-07-27 NOTE — Patient Instructions (Signed)
Pregabalin capsules What is this medicine? PREGABALIN (pre GAB a lin) is used to treat nerve pain from diabetes, shingles, spinal cord injury, and fibromyalgia. It is also used to control seizures in epilepsy. This medicine may be used for other purposes; ask your health care provider or pharmacist if you have questions. COMMON BRAND NAME(S): Lyrica What should I tell my health care provider before I take this medicine? They need to know if you have any of these conditions:  heart disease  history of drug abuse or alcohol abuse problem  kidney disease  lung or breathing disease  suicidal thoughts, plans, or attempt; a previous suicide attempt by you or a family member  an unusual or allergic reaction to pregabalin, gabapentin, other medicines, foods, dyes, or preservatives  pregnant or trying to get pregnant  breast-feeding How should I use this medicine? Take this medicine by mouth with a glass of water. Follow the directions on the prescription label. You can take it with or without food. If it upsets your stomach, take it with food. Take your medicine at regular intervals. Do not take it more often than directed. Do not stop taking except on your doctor's advice. A special MedGuide will be given to you by the pharmacist with each prescription and refill. Be sure to read this information carefully each time. Talk to your pediatrician regarding the use of this medicine in children. While this drug may be prescribed for children as young as 1 month for selected conditions, precautions do apply. Overdosage: If you think you have taken too much of this medicine contact a poison control center or emergency room at once. NOTE: This medicine is only for you. Do not share this medicine with others. What if I miss a dose? If you miss a dose, take it as soon as you can. If it is almost time for your next dose, take only that dose. Do not take double or extra doses. What may interact with this  medicine? This medicine may interact with the following medications:  alcohol  antihistamines for allergy, cough, and cold  certain medicines for anxiety or sleep  certain medicines for depression like amitriptyline, fluoxetine, sertraline  certain medicines for diabetes  certain medicines for seizures like phenobarbital, primidone  general anesthetics like halothane, isoflurane, methoxyflurane, propofol  local anesthetics like lidocaine, pramoxine, tetracaine  medicines that relax muscles for surgery  narcotic medicines for pain  phenothiazines like chlorpromazine, mesoridazine, prochlorperazine, thioridazine This list may not describe all possible interactions. Give your health care provider a list of all the medicines, herbs, non-prescription drugs, or dietary supplements you use. Also tell them if you smoke, drink alcohol, or use illegal drugs. Some items may interact with your medicine. What should I watch for while using this medicine? Tell your doctor or healthcare professional if your symptoms do not start to get better or if they get worse. Visit your doctor or health care professional for regular checks on your progress. Do not stop taking except on your doctor's advice. You may develop a severe reaction. Your doctor will tell you how much medicine to take. Wear a medical identification bracelet or chain if you are taking this medicine for seizures, and carry a card that describes your disease and details of your medicine and dosage times. You may get drowsy or dizzy. Do not drive, use machinery, or do anything that needs mental alertness until you know how this medicine affects you. Do not stand or sit up quickly, especially if  you are an older patient. This reduces the risk of dizzy or fainting spells. Alcohol may interfere with the effect of this medicine. Avoid alcoholic drinks. If you have a heart condition, like congestive heart failure, and notice that you are retaining  water and have swelling in your hands or feet, contact your health care provider immediately. The use of this medicine may increase the chance of suicidal thoughts or actions. Pay special attention to how you are responding while on this medicine. Any worsening of mood, or thoughts of suicide or dying should be reported to your health care professional right away. This medicine has caused reduced sperm counts in some men. This may interfere with the ability to father a child. You should talk to your doctor or health care professional if you are concerned about your fertility. Women who become pregnant while using this medicine for seizures may enroll in the [place] Pregnancy Registry by calling [phone]. This registry collects information about the safety of antiepileptic drug use during pregnancy. What side effects may I notice from receiving this medicine? Side effects that you should report to your doctor or health care professional as soon as possible:  allergic reactions like skin rash, itching or hives, swelling of the face, lips, or tongue  breathing problems  changes in vision  chest pain  confusion  jerking or unusual movements of any part of your body  loss of memory  muscle pain, tenderness, or weakness  suicidal thoughts or other mood changes  swelling of the ankles, feet, hands  unusual bruising or bleeding Side effects that usually do not require medical attention (report to your doctor or health care professional if they continue or are bothersome):  dizziness  drowsiness  dry mouth  headache  nausea  tremors  trouble sleeping  weight gain This list may not describe all possible side effects. Call your doctor for medical advice about side effects. You may report side effects to FDA at 1-800-FDA-1088. Where should I keep my medicine? Keep out of the reach of children. This medicine can be abused. Keep your medicine in a safe  place to protect it from theft. Do not share this medicine with anyone. Selling or giving away this medicine is dangerous and against the law. This medicine may cause accidental overdose and death if it taken by other adults, children, or pets. Mix any unused medicine with a substance like cat litter or coffee grounds. Then throw the medicine away in a sealed container like a sealed bag or a coffee can with a lid. Do not use the medicine after the expiration date. Store at room temperature between 15 and 30 degrees C (59 and 86 degrees F). NOTE: This sheet is a summary. It may not cover all possible information. If you have questions about this medicine, talk to your doctor, pharmacist, or health care provider.  2020 Elsevier/Gold Standard (2018-09-20 13:15:55)  Yoga Tai Chi Walking

## 2020-07-27 NOTE — Progress Notes (Signed)
Subjective:    Patient ID: Marcus Bullock, male    DOB: 1986/04/20, 34 y.o.   MRN: [id number]  HPI  chief complaint: Widespread body pain 34 year old male with history of mid back as well as low back pain.  He states that he really has pain all over meaning that he has pain in the neck shoulder upper chest mid back low back and buttock area.  He has had no trauma to the area.  He does exercise on a regular basis but he has not been able to exercise as vigorously as he once did.  He has been seen by rheumatology who did a work-up of his complaints and diagnosed fibromyalgia syndrome.  The patient also was seen by orthopedics, physical medicine and rehabilitation, neurology and neurosurgery.  MRI of the thoracic spine showed a small T8-T9 disc protrusion without central canal stenosis.  Cervical and lumbar MRIs in 2020 did not show any significant abnormalities.   Pain Inventory Average Pain 6 Pain Right Now 5 My pain is constant  In the last 24 hours, has pain interfered with the following? General activity 7 Relation with others 7 Enjoyment of life 7 What TIME of day is your pain at its worst? night Sleep (in general) Fair  Pain is worse with: sitting and inactivity Pain improves with: other Relief from Meds: 5  Family History  Problem Relation Age of Onset  . Alcohol abuse Father   . Ulcers Father   . Heart disease Maternal Grandmother   . Cancer Paternal Grandmother        breast  . Thyroid disease Mother    Social History   Socioeconomic History  . Marital status: Single    Spouse name: Not on file  . Number of children: 0  . Years of education: college  . Highest education level: Bachelor's degree (e.g., BA, AB, BS)  Occupational History  . Occupation: 5th grade teacher  Tobacco Use  . Smoking status: Never Smoker  . Smokeless tobacco: Never Used  Vaping Use  . Vaping Use: Never used  Substance and Sexual Activity  . Alcohol use: Yes    Comment: Socially    . Drug use: Not Currently  . Sexual activity: Yes    Birth control/protection: None    Comment: male partner  Other Topics Concern  . Not on file  Social History Narrative   Automotive engineer    Not married    No kids      Likes to work out and bowl.   Right-handed.   One cup tea per day.      Social Determinants of Health   Financial Resource Strain:   . Difficulty of Paying Living Expenses: Not on file  Food Insecurity:   . Worried About Charity fundraiser in the Last Year: Not on file  . Ran Out of Food in the Last Year: Not on file  Transportation Needs:   . Lack of Transportation (Medical): Not on file  . Lack of Transportation (Non-Medical): Not on file  Physical Activity:   . Days of Exercise per Week: Not on file  . Minutes of Exercise per Session: Not on file  Stress:   . Feeling of Stress : Not on file  Social Connections:   . Frequency of Communication with Friends and Family: Not on file  . Frequency of Social Gatherings with Friends and Family: Not on file  . Attends Religious Services: Not on file  . Active  Member of Clubs or Organizations: Not on file  . Attends Archivist Meetings: Not on file  . Marital Status: Not on file   History reviewed. No pertinent surgical history. History reviewed. No pertinent surgical history. Past Medical History:  Diagnosis Date  . Back pain   . Left ankle sprain   . Neck pain   . 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   BP 121/80   Pulse (!) 101   Temp (!) 97.5 F (36.4 C)   Ht '5\' 11"'  (1.803 m)   Wt 191 lb (86.6 kg)   SpO2 98%   BMI 26.64 kg/m   Opioid Risk Score:   Fall Risk Score:  `1  Depression screen PHQ 2/9  Depression screen [facility] 2/9 03/05/2020 08/06/2019 01/22/2018 01/22/2018 01/18/2017  Decreased Interest '2 2 1 ' 0 0  Down, Depressed, Hopeless 3 3 - 1 2  PHQ - 2 Score '5 5 1 1 2  ' Altered sleeping 2 3 - - 1  Tired, decreased energy 3 3 - - 1    Change in appetite 1 0 - - 2  Feeling bad or failure about yourself  2 0 - - 0  Trouble concentrating 0 0 - - 0  Moving slowly or fidgety/restless 0 0 - - 0  Suicidal thoughts 2 1 - - 0  PHQ-9 Score 15 12 - - 6  Difficult doing work/chores - Somewhat difficult - - -   Review of Systems  Constitutional: Positive for fatigue.  HENT: Negative.   Eyes: Negative.   Respiratory: Negative.   Cardiovascular: Negative.   Gastrointestinal: Negative.   Endocrine: Negative.   Genitourinary: Negative.   Musculoskeletal: Positive for arthralgias, back pain, myalgias and neck pain.  Skin: Negative.   Allergic/Immunologic: Negative.   Neurological: Negative.   Hematological: Negative.   Psychiatric/Behavioral: Positive for sleep disturbance.  All other systems reviewed and are negative.      Objective:   Physical Exam Constitutional:      Appearance: He is normal weight.  HENT:     Head: Normocephalic and atraumatic.  Eyes:     Extraocular Movements: Extraocular movements intact.     Conjunctiva/sclera: Conjunctivae normal.     Pupils: Pupils are equal, round, and reactive to light.  Musculoskeletal:     Comments: Full range of motion bilateral upper and lower extremities no evidence of joint swelling or effusion. Tenderness palpation bilateral upper traps bilateral sternocostal area bilateral upper medial scapular border as well as midpoint of infraspinatus fossa also tenderness along the thoracic and lumbar paraspinal areas.  Neurological:     Mental Status: He is alert and oriented to person, place, and time.     Sensory: No sensory deficit.     Motor: No weakness.     Coordination: Coordination normal.     Gait: Gait normal.     Deep Tendon Reflexes: Reflexes normal.     Comments: Negative straight leg raising test motor strength is 5/5 bilateral deltoid bicep tricep grip hip flexion knee extension ankle dorsiflexion plantarflexion no evidence of atrophy sensations intact to  light touch bilateral upper and lower limbs  Psychiatric:        Mood and Affect: Mood normal.        Behavior: Behavior normal.           Assessment & Plan:  #1.  We discussed the patient's widespread pain complaints.  We discussed that this fits in  with his prior diagnosis of fibromyalgia syndrome.  There is a chance this could be more of a myofascial pain syndrome given that it is mainly confined to the torso.  We discussed medication management he did not like the way duloxetine made him feel.  He has had gabapentin prescribed in the past and did not like how it made him feel.  We will try a very low dose of pregabalin 25 mg twice daily and titrate upward very slowly.  We will see him back in 6 weeks. We discussed exercise that may be beneficial for this condition shying away from heavy weightlifting or very vigorous running and trying yoga or tai chi walking and gentle stretching Over half of the 30 min visit was spent counseling and coordinating care.

## 2020-08-03 ENCOUNTER — Encounter: Payer: Self-pay | Admitting: Adult Health

## 2020-08-17 ENCOUNTER — Ambulatory Visit: Payer: BC Managed Care – PPO | Admitting: Adult Health

## 2020-08-17 ENCOUNTER — Other Ambulatory Visit: Payer: Self-pay

## 2020-08-17 ENCOUNTER — Encounter: Payer: Self-pay | Admitting: Adult Health

## 2020-08-17 VITALS — BP 100/78 | HR 86 | Temp 98.6°F | Ht 71.0 in | Wt 191.0 lb

## 2020-08-17 DIAGNOSIS — M797 Fibromyalgia: Secondary | ICD-10-CM

## 2020-08-17 DIAGNOSIS — M7918 Myalgia, other site: Secondary | ICD-10-CM | POA: Diagnosis not present

## 2020-08-17 NOTE — Progress Notes (Signed)
Subjective:    Patient ID: Marcus Bullock, male    DOB: 07/21/1986, 34 y.o.   MRN: [id number]  HPI 34 year old male who is being evaluated today for continued widespread body pain.  He has been seen by multiple specialties including rheumatology and most recent physical medicine and rehab.[name]  He was diagnosed with fibromyalgia syndrome by rheumatology.  Most recently was started on Lyrica 25 mg by physical medicine and rehab about 4 weeks ago.  He reports he has been taking this as directed twice a day and he may or may not be feeling any improvement.  Reports that he accidentally took 2 tabs 1 day and did notice that he felt a little bit better with the 50 mg dose.  Is going to talk to physical medicine and rehab in 2 weeks at a follow-up appointment about increasing.  He is wondering if there is any other tests or consultations that can be done to further look at where his symptoms are coming from   Review of Systems See HPI   Past Medical History:  Diagnosis Date  . Back pain   . Left ankle sprain   . Neck pain   . 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 Marital status: Single    Spouse name: Not on file  . Number of children: 0  . Years of education: college  . Highest education level: Bachelor's degree (e.g., BA, AB, BS)  Occupational History  . Occupation: 5th grade teacher  Tobacco Use  . Smoking status: Never Smoker  . Smokeless tobacco: Never Used  Vaping Use  . Vaping Use: Never used  Substance and Sexual Activity  . Alcohol use: Yes    Comment: Socially   . Drug use: Not Currently  . Sexual activity: Yes    Birth control/protection: None    Comment: male partner  Other Topics Concern  . Not on file  Social History Narrative   Tourist information centre manager    Not married    No kids      Likes to work out and bowl.   Right-handed.   One cup tea per day.      Social  Determinants of Health   Financial Resource Strain:   . Difficulty of Paying Living Expenses: Not on file  Food Insecurity:   . Worried About Programme researcher, broadcasting/film/video in the Last Year: Not on file  . Ran Out of Food in the Last Year: Not on file  Transportation Needs:   . Lack of Transportation (Medical): Not on file  . Lack of Transportation (Non-Medical): Not on file  Physical Activity:   . Days of Exercise per Week: Not on file  . Minutes of Exercise per Session: Not on file  Stress:   . Feeling of Stress : Not on file  Social Connections:   . Frequency of Communication with Friends and Family: Not on file  . Frequency of Social Gatherings with Friends and Family: Not on file  . Attends Religious Services: Not on file  . Active Member of Clubs or Organizations: Not on file  . Attends Banker Meetings: Not on file  . Marital Status: Not on file  Intimate Partner Violence:   . Fear of Current or Ex-Partner: Not on file  . Emotionally Abused: Not on file  . Physically Abused: Not on file  . Sexually Abused:  Not on file    History reviewed. No pertinent surgical history.  Family History  Problem Relation Age of Onset  . Alcohol abuse Father   . Ulcers Father   . Heart disease Maternal Grandmother   . Cancer Paternal Grandmother        breast  . Thyroid disease Mother     Allergies  Allergen Reactions  . Bee Venom Swelling    Current Outpatient Medications on File Prior to Visit  Medication Sig Dispense Refill  . cyclobenzaprine (FLEXERIL) 10 MG tablet Take 1 tablet (10 mg total) by mouth at bedtime. 30 tablet 2  . pregabalin (LYRICA) 25 MG capsule Take 1 capsule (25 mg total) by mouth 2 (two) times daily. 60 capsule 1   No current facility-administered medications on file prior to visit.    BP 100/78 (BP Location: Left Arm, Patient Position: Sitting, Cuff Size: Large)   Pulse 86   Temp 98.6 F (37 C) (Oral)   Ht 5\' 11"  (1.803 m)   Wt 191 lb (86.6 kg)    SpO2 98%   BMI 26.64 kg/m       Objective:   Physical Exam Vitals reviewed.  Constitutional:      Appearance: Normal appearance. He is well-developed.  Cardiovascular:     Rate and Rhythm: Normal rate and regular rhythm.     Pulses: Normal pulses.     Heart sounds: Normal heart sounds.  Pulmonary:     Effort: Pulmonary effort is normal.     Breath sounds: Normal breath sounds.  Musculoskeletal:        General: Tenderness (Palpation throughout his body most noticeable in the torso) present.  Skin:    General: Skin is warm and dry.     Capillary Refill: Capillary refill takes less than 2 seconds.  Neurological:     General: No focal deficit present.     Mental Status: He is alert.  Psychiatric:        Mood and Affect: Mood normal.        Behavior: Behavior normal.        Thought Content: Thought content normal.        Judgment: Judgment normal.       Assessment & Plan:  He has been worked up extensively in the past including seen multiple specialists as well as advanced imaging.  Has had a lot of blood work done over the last year year and a half as well.  I do not know of anything else that we can do.  He was advised to follow-up with rheumatology and physical medicine and rehab.  We talked riding low-dose Prozac as well as low-dose amitriptyline which I had prescribed in the past but he never took to help with his pain.  There is also a new medication called Savella which may be an option for him, I do not know a lot about this medication but will look into this.  He will follow-up with physical medicine and rehab in 2 weeks and see what they say about increasing lyrica   Time spent on chart review, time with patient; discussion of fibromyalgia , treatment, follow up plan, and documentation 30 minutes   , NP

## 2020-09-07 ENCOUNTER — Encounter: Payer: Self-pay | Admitting: Physical Medicine & Rehabilitation

## 2020-09-07 ENCOUNTER — Other Ambulatory Visit: Payer: Self-pay

## 2020-09-07 ENCOUNTER — Encounter
Payer: BC Managed Care – PPO | Attending: Physical Medicine & Rehabilitation | Admitting: Physical Medicine & Rehabilitation

## 2020-09-07 VITALS — BP 118/77 | HR 98 | Temp 99.0°F | Ht 71.0 in | Wt 193.2 lb

## 2020-09-07 DIAGNOSIS — F4323 Adjustment disorder with mixed anxiety and depressed mood: Secondary | ICD-10-CM | POA: Diagnosis not present

## 2020-09-07 MED ORDER — PREGABALIN 50 MG PO CAPS: 50.0000 mg | ORAL_CAPSULE | Freq: Two times a day (BID) | ORAL | 2 refills | Status: DC

## 2020-09-07 NOTE — Progress Notes (Signed)
Subjective:    Patient ID: Marcus Bullock, male    DOB: 12/31/1985, 34 y.o.   MRN: [id number]  HPI 34 year old male with several year history of widespread body pain has been diagnosed with fibromyalgia syndrome after a work-up that included cervical and thoracic MRI, EMG/NCV by neurology, he has undergone rheumatologic as well as orthopedic evaluation.  This is all been negative.  The patient states that he cannot exercise like he used to and this is a source of frustration for him.  He gets very anxious about the areas of his body that are hurting despite getting testing and reassurance by his physicians. Started on Lyrica 25 mg twice daily last visit in October 2021 after reporting that both gabapentin and duloxetine made him feel funny in the past.  Patient did not have any drowsiness from the Lyrica.  After being on the medicine for almost a month he developed an episode of 3 days of chest pain or rather chest tenderness.  He has had episodes of this in the past and has been to the ER for evaluation of these episodes with negative troponins and EKGs. Pain Inventory Average Pain 6 Pain Right Now 6 My pain is burning, dull, tingling and aching  In the last 24 hours, has pain interfered with the following? General activity 6 Relation with others 6 Enjoyment of life 6 What TIME of day is your pain at its worst? night Sleep (in general) Fair  Pain is worse with: unsure Pain improves with: therapy/exercise Relief from Meds: 0  Family History  Problem Relation Age of Onset  . Alcohol abuse Father   . Ulcers Father   . Heart disease Maternal Grandmother   . Cancer Paternal Grandmother        breast  . Thyroid disease Mother    Social History   Socioeconomic History  . Marital status: Single    Spouse name: Not on file  . Number of children: 0  . Years of education: college  . Highest education level: Bachelor's degree (e.g., BA, AB, BS)  Occupational History  .  Occupation: 5th grade teacher  Tobacco Use  . Smoking status: Never Smoker  . Smokeless tobacco: Never Used  Vaping Use  . Vaping Use: Never used  Substance and Sexual Activity  . Alcohol use: Yes    Comment: Socially   . Drug use: Not Currently  . Sexual activity: Yes    Birth control/protection: None    Comment: male partner  Other Topics Concern  . Not on file  Social History Narrative   Automotive engineer    Not married    No kids      Likes to work out and bowl.   Right-handed.   One cup tea per day.      Social Determinants of Health   Financial Resource Strain:   . Difficulty of Paying Living Expenses: Not on file  Food Insecurity:   . Worried About Charity fundraiser in the Last Year: Not on file  . Ran Out of Food in the Last Year: Not on file  Transportation Needs:   . Lack of Transportation (Medical): Not on file  . Lack of Transportation (Non-Medical): Not on file  Physical Activity:   . Days of Exercise per Week: Not on file  . Minutes of Exercise per Session: Not on file  Stress:   . Feeling of Stress : Not on file  Social Connections:   . Frequency of  Communication with Friends and Family: Not on file  . Frequency of Social Gatherings with Friends and Family: Not on file  . Attends Religious Services: Not on file  . Active Member of Clubs or Organizations: Not on file  . Attends Archivist Meetings: Not on file  . Marital Status: Not on file   No past surgical history on file. No past surgical history on file. Past Medical History:  Diagnosis Date  . Back pain   . Left ankle sprain   . Neck pain   . 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   BP 118/77   Pulse 98   Temp 99 F (37.2 C)   Ht _0  (1.803 m)   Wt 193 lb 3.2 oz (87.6 kg)   SpO2 97%   BMI 26.95 kg/m   Opioid Risk Score:   Fall Risk Score:  `1  Depression screen PHQ 2/9  Depression screen [facility] 2/9 03/05/2020 08/06/2019  01/22/2018 01/22/2018 01/18/2017  Decreased Interest _1 0 0  Down, Depressed, Hopeless 3 3 - 1 2  PHQ - 2 Score _2 Altered sleeping 2 3 - - 1  Tired, decreased energy 3 3 - - 1  Change in appetite 1 0 - - 2  Feeling bad or failure about yourself  2 0 - - 0  Trouble concentrating 0 0 - - 0  Moving slowly or fidgety/restless 0 0 - - 0  Suicidal thoughts 2 1 - - 0  PHQ-9 Score 15 12 - - 6  Difficult doing work/chores - Somewhat difficult - - -     Review of Systems  Musculoskeletal: Positive for back pain and neck pain.       Chest pain  All other systems reviewed and are negative.      Objective:   Physical Exam Vitals and nursing note reviewed.  Constitutional:      Appearance: He is normal weight.  HENT:     Head: Normocephalic and atraumatic.  Eyes:     Extraocular Movements: Extraocular movements intact.     Conjunctiva/sclera: Conjunctivae normal.     Pupils: Pupils are equal, round, and reactive to light.  Musculoskeletal:     Comments: There is mild tenderness bilateral upper traps no muscle spasm palpated in the upper back mid back low back There is no evidence of spinal deformity Upper extremity lower extremity range of motion is full There is no evidence of joint swelling in the fingers wrist elbows or knees.  No pain with range of motion in the upper or lower limbs. Lumbar range of motion is normal with flexion extension lateral bending and rotation  Neurological:     Mental Status: He is alert and oriented to person, place, and time.     Cranial Nerves: No dysarthria or facial asymmetry.     Sensory: No sensory deficit.     Motor: No weakness.     Coordination: Coordination normal.     Gait: Gait is intact.     Comments: Motor strength is 5/5 bilateral deltoid bicep tricep grip hip flexion knee extension ankle dorsiflexion Negative foraminal compression test   Psychiatric:        Mood and Affect: Mood normal.           Assessment & Plan:   #1  fibromyalgia syndrome with widespread body pain.  We discussed that his Lyrica dose was likely too  small to be beneficial and will need to increase to 50 mg twice daily We discussed the importance of exercise and rather than doing high intensity exercise like he previously did would recommend yoga or tai chi or even daily walks 30 minutes  Follow-up in 2 months physical medicine rehab   2.  Anxiety regarding physical symptoms.  We discussed that psychology referral would be helpful to deal with this.  Help manage his anxiety. We also discussed that it would not be inappropriate to try some alternative treatments such as acupuncture

## 2020-09-07 NOTE — Patient Instructions (Signed)
Please try yoga or tai Chi or at least walk 80mn per day

## 2020-09-14 ENCOUNTER — Encounter: Payer: BC Managed Care – PPO | Admitting: Psychology

## 2020-09-22 ENCOUNTER — Ambulatory Visit: Payer: BC Managed Care – PPO | Admitting: Psychology

## 2020-10-27 DIAGNOSIS — G894 Chronic pain syndrome: Secondary | ICD-10-CM | POA: Insufficient documentation

## 2020-10-27 DIAGNOSIS — M47814 Spondylosis without myelopathy or radiculopathy, thoracic region: Secondary | ICD-10-CM | POA: Insufficient documentation

## 2020-11-29 ENCOUNTER — Encounter: Payer: Self-pay | Admitting: Family Medicine

## 2020-11-29 ENCOUNTER — Telehealth: Payer: Self-pay

## 2020-11-29 ENCOUNTER — Other Ambulatory Visit: Payer: Self-pay

## 2020-11-29 ENCOUNTER — Ambulatory Visit (INDEPENDENT_AMBULATORY_CARE_PROVIDER_SITE_OTHER): Payer: BC Managed Care – PPO | Admitting: Family Medicine

## 2020-11-29 VITALS — BP 100/70 | HR 104 | Ht 71.0 in | Wt 200.0 lb

## 2020-11-29 DIAGNOSIS — R200 Anesthesia of skin: Secondary | ICD-10-CM

## 2020-11-29 DIAGNOSIS — E538 Deficiency of other specified B group vitamins: Secondary | ICD-10-CM | POA: Insufficient documentation

## 2020-11-29 DIAGNOSIS — R5383 Other fatigue: Secondary | ICD-10-CM

## 2020-11-29 NOTE — Progress Notes (Signed)
Established Patient Office Visit  Subjective:  Patient ID: Marcus Bullock, male    DOB: 10/03/1985  Age: 35 y.o. MRN: [id number]  CC:  Chief Complaint  Patient presents with  . Back Pain  . Shoulder Pain  . Pain    Right side of abdomen    HPI Marcus Bullock presents for multiple complaints.  His predominant complaint is intermittent left upper extremity numbness for several months.  He has history of chronic myofascial pain and has seen multiple specialists (rheumatology, neurosurgery, neurology, physical rehab).  Has had multiple treatments for some chronic back pain including physical therapy, acupuncture, and dry needling without much improvement.  He has multiple nonspecific complaints including intermittent headaches, fatigue, "pins-and-needles "sensation involving his trunk and extremities.  His major complaint though is intermittent left arm numbness.  No clear triggers.  MRI cervical spine 10/20 showed no cervical spine abnormalities.  He also had MRI of the thoracic spine at that time.  Numbness does not involve his fingertips.  He thinks there may be some occasional weakness.  No right upper extremity numbness or any lower extremity numbness.  He recently went to another practice and had multiple labs.  Thyroid functions normal.  A1c normal.  B12 182.  He is on oral B12 replacement.  He has seen rheumatology previously and was diagnosed apparently with fibromyalgia.  Denies any recent exertional chest pains.  He had some atypical chest symptoms.  Does have a lot of anxiety regarding his physical symptoms.  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  Past Medical History:  Diagnosis Date  . Back pain   . Left ankle sprain   . Neck pain   . 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  Family History  Problem Relation Age of Onset  . Alcohol abuse Father   . Ulcers Father   . Heart disease  Maternal Grandmother   . Cancer Paternal Grandmother        breast  . Thyroid disease Mother     Social History   Socioeconomic History  . Marital status: Single    Spouse name: Not on file  . Number of children: 0  . Years of education: college  . Highest education level: Bachelor's degree (e.g., BA, AB, BS)  Occupational History  . Occupation: 5th grade teacher  Tobacco Use  . Smoking status: Never Smoker  . Smokeless tobacco: Never Used  Vaping Use  . Vaping Use: Never used  Substance and Sexual Activity  . Alcohol use: Yes    Comment: Socially   . Drug use: Not Currently  . Sexual activity: Yes    Birth control/protection: None    Comment: male partner  Other Topics Concern  . Not on file  Social History Narrative   Tourist information centre manager    Not married    No kids      Likes to work out and bowl.   Right-handed.   One cup tea per day.      Social Determinants of Health   Financial Resource Strain: Not on file  Food Insecurity: Not on file  Transportation Needs: Not on file  Physical Activity: Not on file  Stress: Not on file  Social Connections: Not on file  Intimate Partner Violence: Not on file    Outpatient Medications Prior to Visit  Medication Sig Dispense Refill  . cetirizine (ZYRTEC) 10 MG tablet  Take by mouth.    . Cyanocobalamin (B-12 PO) Take by mouth.    . cyclobenzaprine (FLEXERIL) 10 MG tablet Take 1 tablet (10 mg total) by mouth at bedtime. 30 tablet 2  . pregabalin (LYRICA) 75 MG capsule Take by mouth.    . pregabalin (LYRICA) 50 MG capsule Take 1 capsule (50 mg total) by mouth 2 (two) times daily. 60 capsule 2   No facility-administered medications prior to visit.    Allergies  Allergen Reactions  . Bee Venom Swelling    ROS Review of Systems  Constitutional: Negative for appetite change and unexpected weight change.  Eyes: Negative for visual disturbance.  Respiratory: Negative for cough.   Cardiovascular: Negative for  chest pain and palpitations.  Musculoskeletal: Positive for back pain.  Neurological: Positive for numbness. Negative for seizures, syncope, facial asymmetry and speech difficulty.      Objective:    Physical Exam Vitals reviewed.  Constitutional:      Appearance: Normal appearance.  Cardiovascular:     Rate and Rhythm: Normal rate and regular rhythm.  Pulmonary:     Effort: Pulmonary effort is normal.     Breath sounds: Normal breath sounds.  Musculoskeletal:     Right lower leg: No edema.     Left lower leg: No edema.     Comments: Left upper extremity reveals no edema.  No localized tenderness.  Neurological:     Mental Status: He is alert.     BP 100/70   Pulse (!) 104   Ht 5\' 11"  (1.803 m)   Wt 200 lb (90.7 kg)   SpO2 99%   BMI 27.89 kg/m  Wt Readings from Last 3 Encounters:  11/29/20 200 lb (90.7 kg)  09/07/20 193 lb 3.2 oz (87.6 kg)  08/17/20 191 lb (86.6 kg)     Health Maintenance Due  Topic Date Due  . INFLUENZA VACCINE  05/02/2020  . COVID-19 Vaccine (3 - Booster for Pfizer series) 06/27/2020    There are no preventive care reminders to display for this patient.  Lab Results  Component Value Date   TSH 1.87 02/13/2020   Lab Results  Component Value Date   WBC 8.0 06/12/2020   HGB 13.8 06/12/2020   HCT 42.9 06/12/2020   MCV 91.1 06/12/2020   PLT 242 06/12/2020   Lab Results  Component Value Date   NA 141 06/12/2020   K 3.9 06/12/2020   CO2 26 06/12/2020   GLUCOSE 93 06/12/2020   BUN 9 06/12/2020   CREATININE 0.93 06/12/2020   BILITOT 0.9 06/12/2020   ALKPHOS 41 06/12/2020   AST 25 06/12/2020   ALT 25 06/12/2020   PROT 7.6 06/12/2020   ALBUMIN 4.5 06/12/2020   CALCIUM 9.4 06/12/2020   ANIONGAP 12 06/12/2020   GFR 115.29 08/06/2019   Lab Results  Component Value Date   CHOL 146 08/06/2019   Lab Results  Component Value Date   HDL 48.90 08/06/2019   Lab Results  Component Value Date   LDLCALC 75 08/06/2019   Lab Results   Component Value Date   TRIG 115.0 08/06/2019   Lab Results  Component Value Date   CHOLHDL 3 08/06/2019   Lab Results  Component Value Date   HGBA1C 4.3 02/13/2020      Assessment & Plan:   #1 Subjective intermittent left arm/forearm numbness.  No neck pain.  No focal weakness.  Etiology unclear.  No evidence for CTS or likely cervical radiculitis and prior cervical MRI  was normal.   -we discussed getting him back in to see neurology- whom he has seen in past to further assess LUE numbness  #2  B12 deficiency.  He is on oral replacement. Needs repeat B12 in 3-4 months  #3  Multiple somatic complaints.  Pt has pending neuropsychological assessment.   He did score high on PHQ-9 today.  Denies any active suicidal ideation.   We recommend close follow up with primary   No orders of the defined types were placed in this encounter.   Follow-up: No follow-ups on file.    Evelena Peat, MD

## 2020-11-29 NOTE — Telephone Encounter (Signed)
Called patient to discuss PHQ9 results.  He said he feels hopeless at times and that he may be better off dead.  He denies a plan.  Spoke with patient for .  He cried.  I offered to call 911, he denied and said he is just frustrated.  Advised patient where behavioral health hospital was and that he could go there anytime he feels uneasy.  Suggested patient go outside and take in the sun and breath.  Patient did this.  I asked if he had anyone he can call, he said his dad.  Patient is frustrated he can't exercise or seem to enjoy life like he used to.  Suggested patient call neuropsycholgy and reschedule appointment.  Informed Dr Caryl Never of concerns.  Was able to schedule appointment with Dr Kieth Brightly for 03/3.  Patient informed and says he is feeling better.

## 2020-11-29 NOTE — Patient Instructions (Signed)
Orthopedic physical assessment (7th ed., pp. 164- 242). Elsevier.">  Paresthesia Paresthesia is an abnormal burning or prickling sensation. It is usually felt in the hands, arms, legs, or feet. However, it may occur in any part of the body. Usually, paresthesia is not painful. It may feel like:  Tingling or numbness.  Buzzing.  Itching. Paresthesia may occur without any clear cause, or it may be caused by:  Breathing too quickly (hyperventilation).  Pressure on a nerve.  An underlying medical condition.  Side effects of a medication.  Nutritional deficiencies.  Exposure to toxic chemicals. Most people experience temporary (transient) paresthesia at some time in their lives. For some people, it may be long-lasting (chronic) because of an underlying medical condition. If you have paresthesia that lasts a long time, you need to be evaluated by your health care provider. Follow these instructions at home: Alcohol use  Do not drink alcohol if: ? Your health care provider tells you not to drink. ? You are pregnant, may be pregnant, or are planning to become pregnant.  If you drink alcohol: ? Limit how much you use to:  0-1 drink a day for women.  0-2 drinks a day for men. ? Be aware of how much alcohol is in your drink. In the [place] one drink equals one 12 oz bottle of beer (355 mL), one 5 oz glass of wine (148 mL), or one 1 oz glass of hard liquor (44 mL).   Nutrition  Eat a healthy diet. This includes: ? Eating foods that are high in fiber, such as fresh fruits and vegetables, whole grains, and beans. ? Limiting foods that are high in fat and processed sugars, such as fried or sweet foods.   General instructions  Take over-the-counter and prescription medicines only as told by your health care provider.  Do not use any products that contain nicotine or tobacco, such as cigarettes and e-cigarettes. These can keep blood from reaching damaged nerves. If you need help quitting,  ask your health care provider.  If you have diabetes, work closely with your health care provider to keep your blood sugar under control.  If you have numbness in your feet: ? Check every day for signs of injury or infection. Watch for redness, warmth, and swelling. ? Wear padded socks and comfortable shoes. These help protect your feet.  Keep all follow-up visits as told by your health care provider. This is important. Contact a health care provider if you:  Have paresthesia that gets worse or does not go away.  Have numbness after an injury.  Have a burning or prickling feeling that gets worse when you walk.  Have pain, cramps, or dizziness, or you faint.  Develop a rash. Get help right away if you:  Feel muscle weakness.  Develop new weakness in an arm or leg.  Have trouble walking or moving.  Have problems with speech, understanding, or vision.  Feel confused.  Cannot control your bladder or bowel movements. Summary  Paresthesia is an abnormal burning or prickling sensation that is usually felt in the hands, arms, legs, or feet. It may also occur in other parts of the body.  Paresthesia may occur without any clear cause, or it may be caused by breathing too quickly (hyperventilation), pressure on a nerve, an underlying medical condition, side effects of a medication, nutritional deficiencies, or exposure to toxic chemicals.  If you have paresthesia that lasts a long time, you need to be evaluated by your health care provider.   This information is not intended to replace advice given to you by your health care provider. Make sure you discuss any questions you have with your health care provider. Document Revised: 06/29/2020 Document Reviewed: 06/29/2020 Elsevier Patient Education  2021 Elsevier Inc.  

## 2020-12-01 ENCOUNTER — Encounter: Payer: Self-pay | Admitting: Adult Health

## 2020-12-02 ENCOUNTER — Encounter: Payer: BC Managed Care – PPO | Attending: Physical Medicine & Rehabilitation | Admitting: Psychology

## 2020-12-02 ENCOUNTER — Other Ambulatory Visit: Payer: Self-pay

## 2020-12-02 DIAGNOSIS — M797 Fibromyalgia: Secondary | ICD-10-CM | POA: Diagnosis present

## 2020-12-02 DIAGNOSIS — G479 Sleep disorder, unspecified: Secondary | ICD-10-CM | POA: Diagnosis present

## 2020-12-02 DIAGNOSIS — G894 Chronic pain syndrome: Secondary | ICD-10-CM | POA: Insufficient documentation

## 2020-12-02 DIAGNOSIS — F411 Generalized anxiety disorder: Secondary | ICD-10-CM | POA: Insufficient documentation

## 2020-12-02 DIAGNOSIS — M2550 Pain in unspecified joint: Secondary | ICD-10-CM | POA: Diagnosis not present

## 2020-12-03 ENCOUNTER — Encounter: Payer: Self-pay | Admitting: Adult Health

## 2020-12-08 ENCOUNTER — Ambulatory Visit (INDEPENDENT_AMBULATORY_CARE_PROVIDER_SITE_OTHER): Payer: BC Managed Care – PPO | Admitting: Adult Health

## 2020-12-08 ENCOUNTER — Other Ambulatory Visit (HOSPITAL_COMMUNITY)
Admission: RE | Admit: 2020-12-08 | Discharge: 2020-12-08 | Disposition: A | Discharge status: Home / Self Care | Payer: BC Managed Care – PPO | Source: Ambulatory Visit | Attending: Adult Health | Admitting: Adult Health

## 2020-12-08 ENCOUNTER — Other Ambulatory Visit: Payer: Self-pay

## 2020-12-08 VITALS — BP 130/82 | HR 100 | Temp 98.7°F | Resp 18 | Ht 71.0 in | Wt 202.0 lb

## 2020-12-08 DIAGNOSIS — Z113 Encounter for screening for infections with a predominantly sexual mode of transmission: Secondary | ICD-10-CM | POA: Insufficient documentation

## 2020-12-08 DIAGNOSIS — F419 Anxiety disorder, unspecified: Secondary | ICD-10-CM

## 2020-12-08 DIAGNOSIS — M797 Fibromyalgia: Secondary | ICD-10-CM | POA: Diagnosis not present

## 2020-12-08 DIAGNOSIS — Z0000 Encounter for general adult medical examination without abnormal findings: Secondary | ICD-10-CM | POA: Diagnosis not present

## 2020-12-08 DIAGNOSIS — G894 Chronic pain syndrome: Secondary | ICD-10-CM | POA: Diagnosis not present

## 2020-12-08 DIAGNOSIS — F32A Depression, unspecified: Secondary | ICD-10-CM

## 2020-12-08 DIAGNOSIS — M7918 Myalgia, other site: Secondary | ICD-10-CM | POA: Diagnosis not present

## 2020-12-08 DIAGNOSIS — K2100 Gastro-esophageal reflux disease with esophagitis, without bleeding: Secondary | ICD-10-CM | POA: Diagnosis not present

## 2020-12-08 LAB — COMPREHENSIVE METABOLIC PANEL
ALT: 36 U/L (ref 0–53)
AST: 23 U/L (ref 0–37)
Albumin: 4.5 g/dL (ref 3.5–5.2)
Alkaline Phosphatase: 47 U/L (ref 39–117)
BUN: 8 mg/dL (ref 6–23)
CO2: 28 mEq/L (ref 19–32)
Calcium: 9.5 mg/dL (ref 8.4–10.5)
Chloride: 101 mEq/L (ref 96–112)
Creatinine, Ser: 1 mg/dL (ref 0.40–1.50)
GFR: 97.52 mL/min (ref >60.00)
Glucose, Bld: 79 mg/dL (ref 70–99)
Potassium: 3.9 mEq/L (ref 3.5–5.1)
Sodium: 136 mEq/L (ref 135–145)
Total Bilirubin: 0.5 mg/dL (ref 0.2–1.2)
Total Protein: 7.6 g/dL (ref 6.0–8.3)

## 2020-12-08 LAB — CBC WITH DIFFERENTIAL/PLATELET
Basophils Absolute: 0 10*3/uL (ref 0.0–0.1)
Basophils Relative: 0.9 % (ref 0.0–3.0)
Eosinophils Absolute: 0 10*3/uL (ref 0.0–0.7)
Eosinophils Relative: 0.8 % (ref 0.0–5.0)
HCT: 41.5 % (ref 39.0–52.0)
Hemoglobin: 13.8 g/dL (ref 13.0–17.0)
Lymphocytes Relative: 22.2 % (ref 12.0–46.0)
Lymphs Abs: 1.2 10*3/uL (ref 0.7–4.0)
MCHC: 33.2 g/dL (ref 30.0–36.0)
MCV: 87.7 fl (ref 78.0–100.0)
Monocytes Absolute: 0.6 10*3/uL (ref 0.1–1.0)
Monocytes Relative: 11 % (ref 3.0–12.0)
Neutro Abs: 3.6 10*3/uL (ref 1.4–7.7)
Neutrophils Relative %: 65.1 % (ref 43.0–77.0)
Platelets: 238 10*3/uL (ref 150.0–400.0)
RBC: 4.73 Mil/uL (ref 4.22–5.81)
RDW: 12.1 % (ref 11.5–15.5)
WBC: 5.6 10*3/uL (ref 4.0–10.5)

## 2020-12-08 LAB — LIPID PANEL
Cholesterol: 140 mg/dL (ref 0–200)
HDL: 49.6 mg/dL (ref >39.00)
LDL Cholesterol: 79 mg/dL (ref 0–99)
NonHDL: 90.23
Total CHOL/HDL Ratio: 3
Triglycerides: 56 mg/dL (ref 0.0–149.0)
VLDL: 11.2 mg/dL (ref 0.0–40.0)

## 2020-12-08 LAB — TSH: TSH: 1.98 u[IU]/mL (ref 0.35–4.50)

## 2020-12-08 MED ORDER — VORTIOXETINE HBR 10 MG PO TABS: 10.0000 mg | ORAL_TABLET | Freq: Every day | ORAL | 0 refills | Status: DC

## 2020-12-08 MED ORDER — OMEPRAZOLE 40 MG PO CPDR: 40.0000 mg | DELAYED_RELEASE_CAPSULE | Freq: Every day | ORAL | 3 refills | Status: DC

## 2020-12-08 NOTE — Patient Instructions (Signed)
I am going to prescribe you an anti anxiety medication called trintellix- please print off the saving card online   I am going to refer you to [facility] for a second opinion   Please follow up with me in one month   We will follow up with you regarding your blood work

## 2020-12-08 NOTE — Progress Notes (Signed)
Subjective:    Patient ID: Marcus Bullock, male    DOB: 12-28-1985, 35 y.o.   MRN: [id number]  HPI Patient presents for yearly preventative medicine examination. He is a pleasant 35 year old male who  has a past medical history of Back pain, Left ankle sprain, Neck pain, and Skin irritation.  Mid Back pain/low back pain/Fibromyalgia Syndrome/Myofasical pain Syndrome-has been seen by multiple specialists; rheumatology, neurosurgery, neurology, physical medicine and rehab.  He has also gone through multiple treatments for some chronic back pain including physical therapy, acupuncture and dry needling without improvement.  Cervical and thoracic MRI, EMG/NCV has been negative.  Has been trialed on duloxetine and gabapentin, both of these were stopped because he did not like the way they made him feel.  Was then placed on Lyrica 25 mg twice daily and titrated upwards very slowly, but stopped taking this as he did not like the way he felt.  Was recently was seen by another primary care provider at [facility] who prescribed him Lyrica 75 mg; unfortunately this has not made much difference in his discomfort.  Rightfully so, he is becoming more frustrated, anxious, and somewhat depressed due to not necessarily having any answers and be able to live his life like you would like.  He has taken off a month from work in order to [employer] and work on himself.  He has also been seen by neuropsychiatry recently, is seeing a nutritionist and plans on seeing a therapist.  He denies suicidal ideation but does question how he will live the rest of his life in this much discomfort.  GERD - taking prilosec OTC. Continues to have GERd symptoms   STD Testing - would like STD testing. No symptoms   All immunizations and health maintenance protocols were reviewed with the patient and needed orders were placed.  Appropriate screening laboratory values were ordered for the patient including screening of hyperlipidemia,  renal function and hepatic function. If indicated by BPH, a PSA was ordered.  Medication reconciliation,  past medical history, social history, problem list and allergies were reviewed in detail with the patient  Goals were established with regard to weight loss, exercise, and  diet in compliance with medications  Wt Readings from Last 3 Encounters:  12/08/20 202 lb (91.6 kg)  11/29/20 200 lb (90.7 kg)  09/07/20 193 lb 3.2 oz (87.6 kg)    Review of Systems  Constitutional: Positive for fatigue.  Eyes: Negative.   Respiratory: Positive for chest tightness.   Cardiovascular: Positive for chest pain.  Gastrointestinal: Negative.   Endocrine: Negative.   Genitourinary: Negative.   Musculoskeletal: Positive for arthralgias, myalgias, neck pain and neck stiffness.  Skin: Negative.   Allergic/Immunologic: Negative.   Neurological: Negative.   Psychiatric/Behavioral: Positive for dysphoric mood. The patient is nervous/anxious.    Past Medical History:  Diagnosis Date  . Back pain   . Left ankle sprain   . Neck pain   . 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 Marital status: Single    Spouse name: Not on file  . Number of children: 0  . Years of education: college  . Highest education level: Bachelor's degree (e.g., BA, AB, BS)  Occupational History  . Occupation: 5th grade teacher  Tobacco Use  . Smoking status: Never Smoker  . Smokeless tobacco: Never Used  Vaping Use  . Vaping Use: Never  used  Substance and Sexual Activity  . Alcohol use: Yes    Comment: Socially   . Drug use: Not Currently  . Sexual activity: Yes    Birth control/protection: None    Comment: male partner  Other Topics Concern  . Not on file  Social History Narrative   Tourist information centre manager    Not married    No kids      Likes to work out and bowl.   Right-handed.   One cup tea per day.      Social  Determinants of Health   Financial Resource Strain: Not on file  Food Insecurity: Not on file  Transportation Needs: Not on file  Physical Activity: Not on file  Stress: Not on file  Social Connections: Not on file  Intimate Partner Violence: Not on file    No past surgical history on file.  Family History  Problem Relation Age of Onset  . Alcohol abuse Father   . Ulcers Father   . Heart disease Maternal Grandmother   . Cancer Paternal Grandmother        breast  . Thyroid disease Mother     Allergies  Allergen Reactions  . Bee Venom Swelling    Current Outpatient Medications on File Prior to Visit  Medication Sig Dispense Refill  . cetirizine (ZYRTEC) 10 MG tablet Take by mouth.    . Cyanocobalamin (B-12 PO) Take by mouth.    . cyclobenzaprine (FLEXERIL) 10 MG tablet Take 1 tablet (10 mg total) by mouth at bedtime. 30 tablet 2  . pregabalin (LYRICA) 75 MG capsule Take by mouth.     No current facility-administered medications on file prior to visit.    BP 130/82 (BP Location: Left Arm, Patient Position: Sitting, Cuff Size: Normal)   Pulse 100   Temp 98.7 F (37.1 C) (Oral)   Resp 18   Ht 5\' 11"  (1.803 m)   Wt 202 lb (91.6 kg)   SpO2 97%   BMI 28.17 kg/m       Objective:   Physical Exam Vitals and nursing note reviewed.  Constitutional:      Appearance: Normal appearance.  HENT:     Right Ear: Tympanic membrane, ear canal and external ear normal. There is no impacted cerumen.     Left Ear: Tympanic membrane, ear canal and external ear normal. There is no impacted cerumen.     Nose: Nose normal. No congestion or rhinorrhea.     Mouth/Throat:     Mouth: Mucous membranes are moist.     Pharynx: Oropharynx is clear.  Eyes:     Extraocular Movements: Extraocular movements intact.     Conjunctiva/sclera: Conjunctivae normal.     Pupils: Pupils are equal, round, and reactive to light.  Cardiovascular:     Rate and Rhythm: Normal rate and regular rhythm.      Pulses: Normal pulses.     Heart sounds: Normal heart sounds.  Pulmonary:     Effort: Pulmonary effort is normal.     Breath sounds: Normal breath sounds.  Abdominal:     General: Abdomen is flat. Bowel sounds are normal.     Palpations: Abdomen is soft.     Tenderness: There is generalized abdominal tenderness.  Musculoskeletal:        General: Tenderness (multiple joint tenderness and left chest wall tenderness with palpation ) present. Normal range of motion.  Skin:    General: Skin is warm.  Neurological:  General: No focal deficit present.     Mental Status: He is alert and oriented to person, place, and time.  Psychiatric:        Mood and Affect: Mood normal.        Behavior: Behavior normal.        Thought Content: Thought content normal.        Judgment: Judgment normal.           Assessment & Plan:  1. Routine general medical examination at a health care facility  - CBC with Differential/Platelet; Future - Comprehensive metabolic panel; Future - Lipid panel; Future - TSH; Future - TSH - CBC with Differential/Platelet - Comprehensive metabolic panel - Lipid panel  2. Gastroesophageal reflux disease with esophagitis without hemorrhage  - omeprazole (PRILOSEC) 40 MG capsule; Take 1 capsule (40 mg total) by mouth daily.  Dispense: 90 capsule; Refill: 3  3. Chronic pain syndrome -He is interested in a second opinion.  Will refer to [facility] rheumatology  - Ambulatory referral to Rheumatology  4. Fibromyalgia syndrome  - Ambulatory referral to Rheumatology  5. Myofascial pain syndrome  - Ambulatory referral to Rheumatology  6. Anxiety and depression -Has been hesitant to try SSRIs in the past.  He is willing to try one now, will prescribe Trintellix in order to decrease the chance of sexual dysfunction - vortioxetine HBr (TRINTELLIX) 10 MG TABS tablet; Take 1 tablet (10 mg total) by mouth daily.  Dispense: 90 tablet; Refill: 0 - Follow up in one month    7. Screen for STD (sexually transmitted disease)  - HIV antibody (with reflex); Future - RPR; Future - Urine cytology ancillary only; Future - Urine cytology ancillary only - RPR - HIV antibody (with reflex)   Shirline Frees, NP

## 2020-12-09 LAB — URINE CYTOLOGY ANCILLARY ONLY
Chlamydia: NEGATIVE
Comment: NEGATIVE
Comment: NEGATIVE
Comment: NORMAL
Neisseria Gonorrhea: NEGATIVE
Trichomonas: NEGATIVE

## 2020-12-09 LAB — HIV ANTIBODY (ROUTINE TESTING W REFLEX): HIV 1&2 Ab, 4th Generation: NONREACTIVE

## 2020-12-09 LAB — RPR: RPR Ser Ql: NONREACTIVE

## 2020-12-21 ENCOUNTER — Encounter: Payer: Self-pay | Admitting: Adult Health

## 2020-12-22 ENCOUNTER — Ambulatory Visit: Payer: BC Managed Care – PPO | Admitting: Neurology

## 2020-12-22 ENCOUNTER — Encounter: Payer: Self-pay | Admitting: Neurology

## 2020-12-22 VITALS — BP 115/79 | HR 86 | Ht 71.0 in | Wt 198.5 lb

## 2020-12-22 DIAGNOSIS — G8929 Other chronic pain: Secondary | ICD-10-CM | POA: Diagnosis not present

## 2020-12-22 DIAGNOSIS — M25512 Pain in left shoulder: Secondary | ICD-10-CM | POA: Diagnosis not present

## 2020-12-22 NOTE — Patient Instructions (Addendum)
[facility]    Address: [address] [place], [place], [place] [address]  Phone: [phone]   Aleve as needed. Votalren

## 2020-12-22 NOTE — Progress Notes (Signed)
Chief Complaint  Patient presents with  . New Patient (Initial Visit)    Reports numbness, tingling and weakness in his right arm. He does have some neck pain as well. He had cervical and thoracic MRI scans in 07/2019. He was taking Lyrica 75mg  BID but stopped it. At the time, he did not feel it was helping but his pain has increased since discontinuation.       ASSESSMENT AND PLAN  Marcus Bullock is a 35 y.o. male   Constellation of complaints,  Including depression,  Left shoulder pain  X-ray of left shoulder  Laboratory evaluation to rule out treatable etiology    DIAGNOSTIC DATA (LABS, IMAGING, TESTING) - I reviewed patient records, labs, notes, testing and imaging myself where available.  Personally reviewed MRI of cervical and thoracic spine October 2020  MRI of cervical spine was normal  MRI of thoracic spine showed degenerative changes, most obvious T8-9, no evidence of significant canal stenosis  Laboratory evaluation March 2022: Normal or negative HIV, RPR, CBC, CMP, TSH, lipid panel, LDL 79  HISTORICAL  Marcus Bullock, is a 35 year old male, seen in request by his primary care doctor 31 for evaluation of neck pain, left hand paresthesia, initial evaluation was on December 22, 2020.   I reviewed and summarized the referring note.  He began to experience symptoms since spring 2020, he complains of neck pain, intermittent radiating discomfort to left shoulder, left arm, denies persistent sensory loss or weakness,  He also complains of significant depression, no longer feel hungry, thinking slow, extreme fatigue, decreased functional status, he is is 12[birth year]16[birth year]2006 for fifth grade, has to take absent leave for 1 month, but he denies significant recovery of his overall health and functional status even after taking a month break  He reported that in the last few days, it is so hard for him to get up feel tired all the time, woozy was a  prolonged activity, felt prickly sensation throughout his body, he denies gait abnormality, no bowel and bladder incontinence  He already had extensive evaluations, including normal chest x-ray, CT of the chest, echocardiogram, laboratory evaluations, MRI of cervical, thoracic spines, detailed above failed to demonstrate etiology  REVIEW OF SYSTEMS: Full 14 system review of systems performed and notable only for as above All other review of systems were negative.  PHYSICAL EXAM   Vitals:   12/22/20 1341  BP: 115/79  Pulse: 86  Weight: 198 lb 8 oz (90 kg)  Height: 5\' 11"  (1.803 m)   Not recorded     Body mass index is 27.69 kg/m.  PHYSICAL EXAMNIATION:  Gen: NAD, conversant, well nourised, well groomed                     Cardiovascular: Regular rate rhythm, no peripheral edema, warm, nontender. Eyes: Conjunctivae clear without exudates or hemorrhage Neck: Supple, no carotid bruits. Pulmonary: Clear to auscultation bilaterally   NEUROLOGICAL EXAM:  MENTAL STATUS: Speech:    Speech is normal; fluent and spontaneous with normal comprehension.  Cognition:     Orientation to time, place and person     Normal recent and remote memory     Normal Attention span and concentration     Normal Language, naming, repeating,spontaneous speech     Fund of knowledge   CRANIAL NERVES: CN II: Visual fields are full to confrontation. Pupils are round equal and briskly reactive to light. CN III, IV, VI: extraocular movement are normal. No  ptosis. CN V: Facial sensation is intact to light touch CN VII: Face is symmetric with normal eye closure  CN VIII: Hearing is normal to causal conversation. CN IX, X: Phonation is normal. CN XI: Head turning and shoulder shrug are intact  MOTOR: There is no pronator drift of out-stretched arms. Muscle bulk and tone are normal. Muscle strength is normal.  REFLEXES: Reflexes are 2+ and symmetric at the biceps, triceps, knees, and ankles. Plantar  responses are flexor.  SENSORY: Intact to light touch, pinprick and vibratory sensation are intact in fingers and toes.  COORDINATION: There is no trunk or limb dysmetria noted.  GAIT/STANCE: Posture is normal. Gait is steady with normal steps, base, arm swing, and turning. Heel and toe walking are normal. Tandem gait is normal.  Romberg is absent.  ALLERGIES: Allergies  Allergen Reactions  . Bee Venom Swelling    HOME MEDICATIONS: Current Outpatient Medications  Medication Sig Dispense Refill  . cetirizine (ZYRTEC) 10 MG tablet Take 20 mg by mouth daily.    . Cyanocobalamin (B-12 PO) Take by mouth.    . cyclobenzaprine (FLEXERIL) 10 MG tablet Take 1 tablet (10 mg total) by mouth at bedtime. 30 tablet 2  . omeprazole (PRILOSEC) 40 MG capsule Take 1 capsule (40 mg total) by mouth daily. 90 capsule 3  . vortioxetine HBr (TRINTELLIX) 10 MG TABS tablet Take 1 tablet (10 mg total) by mouth daily. 90 tablet 0   No current facility-administered medications for this visit.    PAST MEDICAL HISTORY: Past Medical History:  Diagnosis Date  . Back pain   . Depression   . Left ankle sprain   . Neck pain   . Numbness and tingling   . 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    PAST SURGICAL HISTORY: Past Surgical History:  Procedure Laterality Date  . No past surgery      FAMILY HISTORY: Family History  Problem Relation Age of Onset  . Alcohol abuse Father   . Ulcers Father   . Heart disease Maternal Grandmother   . Cancer Paternal Grandmother        breast  . Thyroid disease Mother     SOCIAL HISTORY: Social History   Socioeconomic History  . Marital status: Single    Spouse name: Not on file  . Number of children: 0  . Years of education: college  . Highest education level: Bachelor's degree (e.g., BA, AB, BS)  Occupational History  . Occupation: 5th grade teacher  Tobacco Use  . Smoking status: Never Smoker  . Smokeless  tobacco: Never Used  Vaping Use  . Vaping Use: Never used  Substance and Sexual Activity  . Alcohol use: Yes    Comment: Socially   . Drug use: Not Currently  . Sexual activity: Yes    Birth control/protection: None    Comment: male partner  Other Topics Concern  . Not on file  Social History Narrative   Tourist information centre manager    Not married    No kids      Likes to workout and bowl.   Right-handed.   One cup tea per day.   Lives alone.      Social Determinants of Health   Financial Resource Strain: Not on file  Food Insecurity: Not on file  Transportation Needs: Not on file  Physical Activity: Not on file  Stress: Not on file  Social Connections: Not on file  Intimate  Partner Violence: Not on file      Levert Feinstein, M.D. Ph.D.  [facility] [address] [place], [place] [address] Ph: [phone] Fax: [phone]  CC:  Kristian Covey, MD [address] [place],  [place] [address]  Shirline Frees, NP

## 2020-12-23 ENCOUNTER — Encounter: Payer: Self-pay | Admitting: Psychology

## 2020-12-23 LAB — SEDIMENTATION RATE: Sed Rate: 2 mm/hr (ref 0–15)

## 2020-12-23 LAB — THYROID PANEL WITH TSH
Free Thyroxine Index: 2.5 (ref 1.2–4.9)
T3 Uptake Ratio: 31 % (ref 24–39)
T4, Total: 8.2 ug/dL (ref 4.5–12.0)
TSH: 1.64 u[IU]/mL (ref 0.450–4.500)

## 2020-12-23 LAB — C-REACTIVE PROTEIN: CRP: <1 mg/L (ref 0–10)

## 2020-12-23 LAB — ANA W/REFLEX IF POSITIVE: Anti Nuclear Antibody (ANA): NEGATIVE

## 2020-12-23 LAB — FERRITIN: Ferritin: 319 ng/mL (ref 30–400)

## 2020-12-23 LAB — VITAMIN D 25 HYDROXY (VIT D DEFICIENCY, FRACTURES): Vit D, 25-Hydroxy: 31.6 ng/mL (ref 30.0–100.0)

## 2020-12-23 NOTE — Progress Notes (Signed)
Neuropsychological Consultation   Patient:   Marcus Bullock   DOB:   09-30-1986  MR Number:  [id number]  Location:  [facility] FOR PAIN AND [facility] MEDICINE [facility] PHYSICAL MEDICINE AND REHABILITATION [address] [place], [address] [id number] [facility] [place] [address] Dept: [phone]           Date of Service:   12/02/2020  Start Time:   3 PM End Time:   5 PM  Today's visit was an in person visit that was conducted in my outpatient clinic office.  The patient myself were present for this visit.  Provider/Observer:  Arley Phenix, Psy.D.       Clinical Neuropsychologist       Billing Code/Service: [phone]   Reason for Service:  Marcus Bullock is a 35 year old male referred by his PCP Shirline Frees, NP.  The patient has been followed by Dr. Wynn Banker through physical medicine as recently as December 2021 due to fibromyalgia type symptoms and adjustment disorder.  The patient has been followed by rheumatology and neurology as well with records being available in his EMR.  During his most recent rheumatology visit he was diagnosed with polyarthralgia and has had various diagnostic considerations including fibromyalgia etc.  The patient also has symptoms consistent with the development of complex regional pain syndrome.  The patient describes significant left arm pain radiating down his arm, abdominal pain, fatigue, periods of lightheadedness and feelings of prickly heat.  He describes left body pain all over.  He reports that is having a significant deleterious effect on his ability to sleep and it is exaggerated at sleep time.  There have been some abnormalities related to disc degeneration from T5-6 through T8-9 with central disc protrusions but these would not be correlated with some of his pain locations.  The patient also had a very bad ankle sprain with avulsion injury 2-1/2 years ago that predated the development of most of his symptoms.  The patient has  been describing radiating pain down his left arm since spring 2020 that has been intermittent.  He also has a burning sensation down his left arm, left shoulder, "inside", and just not feeling very strong.  The patient has reduced appetite and feels sluggish and fatigue with decreased function.  While the patient describes left arm pain for the last year that is radiating through his arm and experiences a chest tightness that does appear that this pain may have been around for some time.  The patient describes 2 years ago that he began feeling significant pain in his left upper back region and was diagnosed with fibromyalgia.  He now has low back pain.  The patient also reports that his left front upper chest region is constantly hurting and he feels fatigued, tired, pain and prickly heat sensations throughout his body.  The patient reports that when he begins to sweat his pain seems to go away.  He reports that his pain is all over his body but most of it is in the upper left side of his body.  The patient does report that his symptoms got progressively worse particularly around 1 year ago.  The patient reports that he has had a fairly good response to medications but continues to have significant difficulties.  The patient is quite concerned and struggling with these types of sensations.  The patient reports that he does get approximately 1 migraine per month.  The patient reports that around 2-1/2 years ago that he had a very  bad ankle sprain with avulsion.[name]  He reports that these other symptoms have not developed yet.  The patient does have medical records showing ED visits and other outpatient visits regarding chronic bilateral back pain going back into 2021.  He also has visits regarding significant fatigue and cervical pain noted.  The earliest date and medical records with visits related to reports of chronic right-sided low back pain date April 2018.  However, medical records also show a primary care  visit in 2015 where he had a primary complaint of itchy skin and reported that he has had this condition for many years that is worse in the winter.  He describes dry itchy skin especially when he sweats or when he is anxious and that he was seen by dermatology and has tried addressing it back then as a allergic reaction.  He also described feeling anxious all of the time back in 2015 but denied fevers malaise weight loss or other symptoms.  There also medical records related to suffering a ankle sprain in 2016 while playing basketball.  He described inability to bear weight for 2 weeks and he had walking boot and crutches borrowed from a family member that he used for 1 week.  He was doing better but continued to have persistent mild pain for some time.  In these records he reports that he sprained his ankle a frequently and therefore did not initially go to the doctor around this particular sprain.    Behavioral Observation: Marcus Bullock  presents as a 35 y.o.-year-old Right handed Male who appeared his stated age. his dress was Appropriate and he was Well Groomed and his manners were Appropriate to the situation.  his participation was indicative of Appropriate and Attentive behaviors.  There were not physical disabilities noted.  he displayed an appropriate level of cooperation and motivation.     Interactions:    Active Appropriate and Attentive  Attention:   within normal limits and attention span and concentration were age appropriate  Memory:   within normal limits; recent and remote memory intact  Visuo-spatial:  not examined  Speech (Volume):  normal  Speech:   normal; normal  Thought Process:  Coherent and Relevant  Though Content:  WNL; not suicidal and not homicidal  Orientation:   person, place, time/date and situation  Judgment:   Good  Planning:   Fair  Affect:    Anxious  Mood:    Anxious  Insight:   Good  Intelligence:   high  Marital Status/Living: The  patient was born and raised in [place] along with 2 siblings.  He is single and lives alone.  The patient has no children.  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  Hobbies and interests have included working out, traveling, yoga and reading although he has been having difficulty with exercise due to significant pain.  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  Medical History:   Past Medical History:  Diagnosis Date  . Back pain   . Depression   . Left ankle sprain   . Neck pain   . Numbness and tingling   . 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 Chronic  left shoulder pain 12/22/2020  . B12 deficiency 11/29/2020  . Chronic pain syndrome 10/27/2020  . Thoracic spondylosis 10/27/2020  . Muscle spasm 09/11/2019  . Bilateral low back pain without sciatica 09/11/2019  . Ankle impingement syndrome 08/11/2018  . Breast skin changes 01/18/2017  . Seasonal allergic rhinitis 01/18/2017  . Acute low back pain 01/18/2017  . Vitamin D deficiency 11/30/2009  . Impacted cerumen 11/18/2009  . Pityriasis versicolor 11/15/2009    Psychiatric History:  Patient has a past psychiatric history including depression as well as multiple descriptions of significant anxiety in his medical records.  Family Med/Psych History:  Family History  Problem Relation Age of Onset  . Alcohol abuse Father   . Ulcers Father   . Heart disease Maternal Grandmother   . Cancer Paternal Grandmother        breast  . Thyroid disease Mother     Impression/DX:  Marcus Bullock is a 35 year old male referred by his PCP Shirline Frees, NP.  The patient has been followed by Dr. Wynn Banker through physical medicine as recently as December 2021 due to  fibromyalgia type symptoms and adjustment disorder.  The patient has been followed by rheumatology and neurology as well with records being available in his EMR.  During his most recent rheumatology visit he was diagnosed with polyarthralgia and has had various diagnostic considerations including fibromyalgia etc.  The patient also has symptoms consistent with the development of complex regional pain syndrome.  The patient describes significant left arm pain radiating down his arm, abdominal pain, fatigue, periods of lightheadedness and feelings of prickly heat.  He describes left body pain all over.  He reports that is having a significant deleterious effect on his ability to sleep and it is exaggerated at sleep time.  There have been some abnormalities related to disc degeneration from T5-6 through T8-9 with central disc protrusions but these would not be correlated with some of his pain locations.  The patient also had a very bad ankle sprain with avulsion injury 2-1/2 years ago that predated the development of most of his symptoms.  The patient has been describing radiating pain down his left arm since spring 2020 that has been intermittent.  He also has a burning sensation down his left arm, left shoulder, "inside", and just not feeling very strong.  The patient has reduced appetite and feels sluggish and fatigue with decreased function.   Disposition/Plan:  We have set the patient up for therapeutic interventions.  The patient has a number of symptoms including symptoms consistent with fibromyalgia type symptoms as well as chronic pain and other difficulties with no definitive disease process identified.  The patient has significant anxiety and is not responded particularly well to various medication interventions although reports his current medication regimen is helping.  The patient describes these difficulties with pain negatively impacting his sleep and that he has isolated himself more and has had  negative impact on his sleep.  Diagnosis:    Fibromyalgia syndrome  Polyarthralgia  Chronic pain syndrome         Electronically Signed   _______________________ Arley Phenix, Psy.D. Clinical Neuropsychologist

## 2021-01-05 ENCOUNTER — Encounter: Payer: Self-pay | Admitting: Adult Health

## 2021-02-03 ENCOUNTER — Institutional Professional Consult (permissible substitution): Payer: BC Managed Care – PPO | Admitting: Neurology

## 2021-03-09 ENCOUNTER — Ambulatory Visit: Payer: BC Managed Care – PPO

## 2021-03-09 ENCOUNTER — Ambulatory Visit: Payer: BC Managed Care – PPO | Admitting: Cardiology

## 2021-03-09 ENCOUNTER — Encounter: Payer: Self-pay | Admitting: Cardiology

## 2021-03-09 ENCOUNTER — Other Ambulatory Visit: Payer: Self-pay

## 2021-03-09 VITALS — BP 110/88 | HR 93 | Ht 71.0 in | Wt 200.6 lb

## 2021-03-09 DIAGNOSIS — R42 Dizziness and giddiness: Secondary | ICD-10-CM

## 2021-03-09 DIAGNOSIS — R002 Palpitations: Secondary | ICD-10-CM

## 2021-03-09 DIAGNOSIS — R079 Chest pain, unspecified: Secondary | ICD-10-CM | POA: Diagnosis not present

## 2021-03-09 NOTE — Progress Notes (Unsigned)
Enrolled patient for a 14 day Zio XT Monitor to be mailed to patients home  

## 2021-03-09 NOTE — Progress Notes (Signed)
Cardiology Office Note:    Date:  03/09/2021   ID:  DERREK Bullock, DOB 03/22/1986, MRN [id number]  PCP:  Marcus Frees, NP  Cardiologist:  None  Electrophysiologist:  None   Referring MD: Marcus Gant, MD   Chief Complaint  Patient presents with  . Chest Pain    History of Present Illness:    Marcus Bullock is a 35 y.o. male with a hx of fibromyalgia who is referred by Dr Marcus Bullock for evaluation of chest pain.  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  He also has started having chest pain, has occurred about once per week.  Describes pain in the left lower chest.  Describes as a dull aching pain, lasts for hours.  Occurs randomly, no clear relationship with exertion, typically occurs at rest.  Does report though that he has been having exertional lightheadedness.  Recently tried playing basketball and running, but felt lightheaded and fatigued.  Denies any dyspnea, syncope, or lower extremity edema.  Does report he has been having palpitations about once per month, can last for hours.  Feels like heart is racing during episodes.  Denies any smoking.  No history of heart disease in his immediate family.    Past Medical History:  Diagnosis Date  . Back pain   . Depression   . Left ankle sprain   . Neck pain   . Numbness and tingling   . 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    Past Surgical History:  Procedure Laterality Date  . No past surgery      Current Medications: Current Meds  Medication Sig  . cetirizine (ZYRTEC) 10 MG tablet Take 20 mg by mouth daily.  . Cyanocobalamin (B-12 PO) Take by mouth.  . cyclobenzaprine (FLEXERIL) 10 MG tablet Take 1 tablet (10 mg total) by mouth at bedtime.  [name] omeprazole (PRILOSEC) 40 MG capsule Take 1 capsule (40 mg total) by mouth daily.  [name] vortioxetine HBr (TRINTELLIX) 10 MG TABS tablet Take 1 tablet  (10 mg total) by mouth daily.     Allergies:   Bee venom   Social History   Socioeconomic History  . Marital status: Single    Spouse name: Not on file  . Number of children: 0  . Years of education: college  . Highest education level: Bachelor's degree (e.g., BA, AB, BS)  Occupational History  . Occupation: 5th grade teacher  Tobacco Use  . Smoking status: Never Smoker  . Smokeless tobacco: Never Used  Vaping Use  . Vaping Use: Never used  Substance and Sexual Activity  . Alcohol use: Yes    Comment: Socially   . Drug use: Not Currently  . Sexual activity: Yes    Birth control/protection: None    Comment: male partner  Other Topics Concern  . Not on file  Social History Narrative   Tourist information centre manager    Not married    No kids      Likes to workout and bowl.   Right-handed.   One cup tea per day.   Lives alone.      Social Determinants of Health   Financial Resource Strain: Not on file  Food Insecurity: Not on file  Transportation Needs: Not on file  Physical Activity: Not on file  Stress: Not on file  Social Connections:  Not on file     Family History: The patient's family history includes Alcohol abuse in his father; Cancer in his paternal grandmother; Heart disease in his maternal grandmother; Thyroid disease in his mother; Ulcers in his father.  ROS:   Please see the history of present illness.     All other systems reviewed and are negative.  EKGs/Labs/Other Studies Reviewed:    The following studies were reviewed today:   EKG:  EKG is  ordered today.  The ekg ordered today demonstrates sinus rhythm, rate 93, mild diffuse concave ST elevations, unchanged from prior, likely representing early repolarization  Recent Labs: 12/08/2020: ALT 36; BUN 8; Creatinine, Ser 1.00; Hemoglobin 13.8; Platelets 238.0; Potassium 3.9; Sodium 136 12/22/2020: TSH 1.640  Recent Lipid Panel    Component Value Date/Time   CHOL 140 12/08/2020 1336   TRIG 56.0  12/08/2020 1336   HDL 49.60 12/08/2020 1336   CHOLHDL 3 12/08/2020 1336   VLDL 11.2 12/08/2020 1336   LDLCALC 79 12/08/2020 1336    Physical Exam:    VS:  BP 110/88   Pulse 93   Ht 5\' 11"  (1.803 m)   Wt 200 lb 9.6 oz (91 kg)   SpO2 97%   BMI 27.98 kg/m     Wt Readings from Last 3 Encounters:  03/09/21 200 lb 9.6 oz (91 kg)  12/22/20 198 lb 8 oz (90 kg)  12/08/20 202 lb (91.6 kg)     GEN:  Well nourished, well developed in no acute distress HEENT: Normal NECK: No JVD; No carotid bruits LYMPHATICS: No lymphadenopathy CARDIAC: RRR, no murmurs, rubs, gallops RESPIRATORY:  Clear to auscultation without rales, wheezing or rhonchi  ABDOMEN: Soft, non-tender, non-distended MUSCULOSKELETAL:  No edema; No deformity  SKIN: Warm and dry NEUROLOGIC:  Alert and oriented x 3 PSYCHIATRIC:  Normal affect   ASSESSMENT:    1. Chest pain of uncertain etiology   2. Palpitations   3. Lightheadedness    PLAN:    Chest pain: Atypical in description.  Given age and lack of risk factors, low probability of obstructive CAD.  He is also having lightheadedness with exertion.  EKG is interpretable and he is able to exercise, recommend ETT for further evaluation.  Palpitations: Description concerning for arrhythmia, will evaluate with Zio patch x2 weeks.  Check echocardiogram to rule out structural heart disease.  RTC in 3 months  Shared Decision Making/Informed Consent The risks [chest pain, shortness of breath, cardiac arrhythmias, dizziness, blood pressure fluctuations, myocardial infarction, stroke/transient ischemic attack, and life-threatening complications (estimated to be 1 in 10,000)], benefits (risk stratification, diagnosing coronary artery disease, treatment guidance) and alternatives of an exercise tolerance test were discussed in detail with Mr. Burzynski and he agrees to proceed.     Medication Adjustments/Labs and Tests Ordered: Current medicines are reviewed at length with  the patient today.  Concerns regarding medicines are outlined above.  Orders Placed This Encounter  Procedures  . EXERCISE TOLERANCE TEST (ETT)  . LONG TERM MONITOR (3-14 DAYS)  . EKG 12-Lead  . ECHOCARDIOGRAM COMPLETE   No orders of the defined types were placed in this encounter.   Patient Instructions  Medication Instructions:  Your physician recommends that you continue on your current medications as directed. Please refer to the Current Medication list given to you today.  Testing/Procedures: Your physician has requested that you have an exercise tolerance test. For further information please visit Nonie Hoyer. Please also follow instruction sheet, as given.  Your physician has requested  that you have an echocardiogram. Echocardiography is a painless test that uses sound waves to create images of your heart. It provides your doctor with information about the size and shape of your heart and how well your heart's chambers and valves are working. This procedure takes approximately one hour. There are no restrictions for this procedure.  ZIO XT- Long Term Monitor Instructions   Your physician has requested you wear a ZIO patch monitor for ___ days.  This is a single patch monitor.   [employer] supplies one patch monitor per enrollment. Additional stickers are not available. Please do not apply patch if you will be having a Nuclear Stress Test, Echocardiogram, Cardiac CT, MRI, or Chest Xray during the period you would be wearing the monitor. The patch cannot be worn during these tests. You cannot remove and re-apply the ZIO XT patch monitor.  Your ZIO patch monitor will be sent Fed Ex from [employer] directly to your home address. It may take 3-5 days to receive your monitor after you have been enrolled.  Once you have received your monitor, please review the enclosed instructions. Your monitor has already been registered assigning a specific monitor serial # to  you.  Billing and Patient Assistance Program Information   We have supplied [employer] with any of your insurance information on file for billing purposes. [employer] offers a sliding scale Patient Assistance Program for patients that do not have insurance, or whose insurance does not completely cover the cost of the ZIO monitor.   You must apply for the Patient Assistance Program to qualify for this discounted rate.     To apply, please call [employer] at [phone], select option 4, then select option 2, and ask to apply for Patient Assistance Program.  Meredeth Ide will ask your household income, and how many people are in your household.  They will quote your out-of-pocket cost based on that information.  [employer] will also be able to set up a 33-month, interest-free payment plan if needed.  Applying the monitor   Shave hair from upper left chest.  Hold abrader disc by orange tab. Rub abrader in 40 strokes over the upper left chest as indicated in your monitor instructions.  Clean area with 4 enclosed alcohol pads. Let dry.  Apply patch as indicated in monitor instructions. Patch will be placed under collarbone on left side of chest with arrow pointing upward.  Rub patch adhesive wings for 2 minutes. Remove white label marked "1". Remove the white label marked "2". Rub patch adhesive wings for 2 additional minutes.  While looking in a mirror, press and release button in center of patch. A small green light will flash 3-4 times. This will be your only indicator that the monitor has been turned on. ?  Do not shower for the first 24 hours. You may shower after the first 24 hours.  Press the button if you feel a symptom. You will hear a small click. Record Date, Time and Symptom in the Patient Logbook.  When you are ready to remove the patch, follow instructions on the last 2 pages of the Patient Logbook. Stick patch monitor onto the last page of Patient Logbook.  Place Patient Logbook in the blue and white  box.  Use locking tab on box and tape box closed securely.  The blue and white box has prepaid postage on it. Please place it in the mailbox as soon as possible. Your physician should have your test results approximately 7 days after the  monitor has been mailed back to [employer].  Call [facility] Customer Care at [phone] if you have questions regarding your ZIO XT patch monitor. Call them immediately if you see an orange light blinking on your monitor.  If your monitor falls off in less than 4 days, contact our Monitor department at [phone]. ?If your monitor becomes loose or falls off after 4 days call [employer] at [phone] for suggestions on securing your monitor.?  Follow-Up: At [facility], you and your health needs are our priority.  As part of our continuing mission to provide you with exceptional heart care, we have created designated Provider Care Teams.  These Care Teams include your primary Cardiologist (physician) and Advanced Practice Providers (APPs -  Physician Assistants and Nurse Practitioners) who all work together to provide you with the care you need, when you need it.  We recommend signing up for the patient portal called "MyChart".  Sign up information is provided on this After Visit Summary.  MyChart is used to connect with patients for Virtual Visits (Telemedicine).  Patients are able to view lab/test results, encounter notes, upcoming appointments, etc.  Non-urgent messages can be sent to your provider as well.   To learn more about what you can do with MyChart, go to [email or web address].    Your next appointment:   3 month(s)  The format for your next appointment:   In Person  Provider:   Epifanio Lescheshristopher Pearly Apachito, MD        Signed, [place] Ish[place]aw[place]opher L Jaiona Simien, MD  03/09/2021 4:48 PM    [facility]

## 2021-03-09 NOTE — Patient Instructions (Signed)
Medication Instructions:  Your physician recommends that you continue on your current medications as directed. Please refer to the Current Medication list given to you today.  Testing/Procedures: Your physician has requested that you have an exercise tolerance test. For further information please visit [email or web address] Please also follow instruction sheet, as given.  Your physician has requested that you have an echocardiogram. Echocardiography is a painless test that uses sound waves to create images of your heart. It provides your doctor with information about the size and shape of your heart and how well your heart's chambers and valves are working. This procedure takes approximately one hour. There are no restrictions for this procedure.  ZIO XT- Long Term Monitor Instructions   Your physician has requested you wear a ZIO patch monitor for ___ days.  This is a single patch monitor.   [employer] supplies one patch monitor per enrollment. Additional stickers are not available. Please do not apply patch if you will be having a Nuclear Stress Test, Echocardiogram, Cardiac CT, MRI, or Chest Xray during the period you would be wearing the monitor. The patch cannot be worn during these tests. You cannot remove and re-apply the ZIO XT patch monitor.  Your ZIO patch monitor will be sent Fed Ex from [employer] directly to your home address. It may take 3-5 days to receive your monitor after you have been enrolled.  Once you have received your monitor, please review the enclosed instructions. Your monitor has already been registered assigning a specific monitor serial # to you.  Billing and Patient Assistance Program Information   We have supplied [employer] with any of your insurance information on file for billing purposes. [employer] offers a sliding scale Patient Assistance Program for patients that do not have insurance, or whose insurance does not completely cover the cost of the ZIO monitor.    You must apply for the Patient Assistance Program to qualify for this discounted rate.     To apply, please call [employer] at [phone], select option 4, then select option 2, and ask to apply for Patient Assistance Program.  Meredeth Ide will ask your household income, and how many people are in your household.  They will quote your out-of-pocket cost based on that information.  [employer] will also be able to set up a 63-month, interest-free payment plan if needed.  Applying the monitor   Shave hair from upper left chest.  Hold abrader disc by orange tab. Rub abrader in 40 strokes over the upper left chest as indicated in your monitor instructions.  Clean area with 4 enclosed alcohol pads. Let dry.  Apply patch as indicated in monitor instructions. Patch will be placed under collarbone on left side of chest with arrow pointing upward.  Rub patch adhesive wings for 2 minutes. Remove white label marked "1". Remove the white label marked "2". Rub patch adhesive wings for 2 additional minutes.  While looking in a mirror, press and release button in center of patch. A small green light will flash 3-4 times. This will be your only indicator that the monitor has been turned on. ?  Do not shower for the first 24 hours. You may shower after the first 24 hours.  Press the button if you feel a symptom. You will hear a small click. Record Date, Time and Symptom in the Patient Logbook.  When you are ready to remove the patch, follow instructions on the last 2 pages of the Patient Logbook. Stick patch monitor onto the last page of  Patient Logbook.  Place Patient Logbook in the blue and white box.  Use locking tab on box and tape box closed securely.  The blue and white box has prepaid postage on it. Please place it in the mailbox as soon as possible. Your physician should have your test results approximately 7 days after the monitor has been mailed back to [facility].  Call [facility] Customer Care at  [phone] if you have questions regarding your ZIO XT patch monitor. Call them immediately if you see an orange light blinking on your monitor.  If your monitor falls off in less than 4 days, contact our Monitor department at [phone]. ?If your monitor becomes loose or falls off after 4 days call [employer] at [phone] for suggestions on securing your monitor.?  Follow-Up: At [facility], you and your health needs are our priority.  As part of our continuing mission to provide you with exceptional heart care, we have created designated Provider Care Teams.  These Care Teams include your primary Cardiologist (physician) and Advanced Practice Providers (APPs -  Physician Assistants and Nurse Practitioners) who all work together to provide you with the care you need, when you need it.  We recommend signing up for the patient portal called "MyChart".  Sign up information is provided on this After Visit Summary.  MyChart is used to connect with patients for Virtual Visits (Telemedicine).  Patients are able to view lab/test results, encounter notes, upcoming appointments, etc.  Non-urgent messages can be sent to your provider as well.   To learn more about what you can do with MyChart, go to [email or web address].    Your next appointment:   3 month(s)  The format for your next appointment:   In Person  Provider:   Epifanio Lesches, MD

## 2021-03-11 ENCOUNTER — Telehealth (HOSPITAL_COMMUNITY): Payer: Self-pay | Admitting: *Deleted

## 2021-03-11 NOTE — Telephone Encounter (Signed)
Close encounter 

## 2021-03-16 ENCOUNTER — Ambulatory Visit (HOSPITAL_COMMUNITY)
Admission: RE | Admit: 2021-03-16 | Discharge: 2021-03-16 | Disposition: A | Discharge status: Home / Self Care | Payer: BC Managed Care – PPO | Source: Ambulatory Visit | Attending: Cardiovascular Disease | Admitting: Cardiovascular Disease

## 2021-03-16 ENCOUNTER — Other Ambulatory Visit: Payer: Self-pay

## 2021-03-16 DIAGNOSIS — R079 Chest pain, unspecified: Secondary | ICD-10-CM | POA: Diagnosis not present

## 2021-03-16 LAB — EXERCISE TOLERANCE TEST
Estimated workload: 12.5 METS
Exercise duration (min): 10 min
Exercise duration (sec): 30 s
MPHR: 185 {beats}/min
Peak HR: 193 {beats}/min
Percent HR: 104 %
Rest HR: 96 {beats}/min

## 2021-03-31 ENCOUNTER — Encounter (HOSPITAL_BASED_OUTPATIENT_CLINIC_OR_DEPARTMENT_OTHER): Payer: Self-pay

## 2021-03-31 ENCOUNTER — Other Ambulatory Visit: Payer: Self-pay

## 2021-03-31 ENCOUNTER — Emergency Department (HOSPITAL_BASED_OUTPATIENT_CLINIC_OR_DEPARTMENT_OTHER)
Admission: EM | Admit: 2021-03-31 | Discharge: 2021-04-01 | Disposition: A | Discharge status: Home / Self Care | Payer: BC Managed Care – PPO | Attending: Emergency Medicine | Admitting: Emergency Medicine

## 2021-03-31 DIAGNOSIS — R5383 Other fatigue: Secondary | ICD-10-CM | POA: Diagnosis not present

## 2021-03-31 DIAGNOSIS — R109 Unspecified abdominal pain: Secondary | ICD-10-CM | POA: Diagnosis not present

## 2021-03-31 DIAGNOSIS — R0781 Pleurodynia: Secondary | ICD-10-CM

## 2021-03-31 LAB — COMPREHENSIVE METABOLIC PANEL
ALT: 25 U/L (ref 0–44)
AST: 19 U/L (ref 15–41)
Albumin: 4.6 g/dL (ref 3.5–5.0)
Alkaline Phosphatase: 41 U/L (ref 38–126)
Anion gap: 7 (ref 5–15)
BUN: 8 mg/dL (ref 6–20)
CO2: 29 mmol/L (ref 22–32)
Calcium: 9.8 mg/dL (ref 8.9–10.3)
Chloride: 102 mmol/L (ref 98–111)
Creatinine, Ser: 1.18 mg/dL (ref 0.61–1.24)
GFR, Estimated: >60 mL/min (ref >60)
Glucose, Bld: 98 mg/dL (ref 70–99)
Potassium: 3.6 mmol/L (ref 3.5–5.1)
Sodium: 138 mmol/L (ref 135–145)
Total Bilirubin: 0.5 mg/dL (ref 0.3–1.2)
Total Protein: 7.7 g/dL (ref 6.5–8.1)

## 2021-03-31 LAB — CBC
HCT: 43.4 % (ref 39.0–52.0)
Hemoglobin: 14.5 g/dL (ref 13.0–17.0)
MCH: 29.2 pg (ref 26.0–34.0)
MCHC: 33.4 g/dL (ref 30.0–36.0)
MCV: 87.5 fL (ref 80.0–100.0)
Platelets: 274 10*3/uL (ref 150–400)
RBC: 4.96 MIL/uL (ref 4.22–5.81)
RDW: 11.8 % (ref 11.5–15.5)
WBC: 8.3 10*3/uL (ref 4.0–10.5)
nRBC: 0 % (ref 0.0–0.2)

## 2021-03-31 LAB — LIPASE, BLOOD: Lipase: 15 U/L (ref 11–51)

## 2021-03-31 NOTE — ED Triage Notes (Addendum)
Pt reports right sided abdominal pain that radiates to his back, onset 6-7 months ago but is not getting better. He denies n/v/d or any abnormal urinary symptoms. Has had CT scans for this same pain and have all come back normal.

## 2021-04-01 LAB — URINALYSIS, ROUTINE W REFLEX MICROSCOPIC
Bilirubin Urine: NEGATIVE
Glucose, UA: NEGATIVE mg/dL
Hgb urine dipstick: NEGATIVE
Ketones, ur: NEGATIVE mg/dL
Leukocytes,Ua: NEGATIVE
Nitrite: NEGATIVE
Protein, ur: NEGATIVE mg/dL
Specific Gravity, Urine: 1.021 (ref 1.005–1.030)
pH: 6.5 (ref 5.0–8.0)

## 2021-04-01 NOTE — ED Notes (Signed)
Pt A&OX4, ambulatory at d/c with independent steady gait, NAD. Pt verbalized understanding of d/c instructions and follow up care. 

## 2021-04-01 NOTE — ED Provider Notes (Signed)
[facility] Provider Note: Lowella Dell, MD, FACEP  CSN: [id number] MRN: [id number] ARRIVAL: 03/31/21 at 2231 ROOM: DB010/DB010   CHIEF COMPLAINT  Abdominal Pain   HISTORY OF PRESENT ILLNESS  04/01/21 12:08 AM Marcus Bullock is a 35 y.o. male who has had more than 6 months of chronic pain.  The pain is located on his right side at about the mid axillary line at the very bottom of his rib cage.  He has had lab work and CT scans for the same pain in the past which were unremarkable.  He is here because the pain has worsened over the last week.  He rates it as a 9 out of 10, worse with movement or palpation but not with deep breathing.  He has also felt more fatigued than usual but thinks this may be because he is not able to exercise as much as he would like.  He has been seen by a physiatrist for back pain but not for this specific pain.  He is not having any nausea, vomiting or diarrhea.  He is not having any dysuria or hematuria.   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 Authorizing Provider  cetirizine (ZYRTEC) 10 MG tablet Take 20 mg by mouth daily.    [provider]  Cyanocobalamin (B-12 PO) Take by mouth.    [provider]  cyclobenzaprine (FLEXERIL) 10 MG tablet Take 1 tablet (10 mg total) by mouth at bedtime. 07/27/20   Kirsteins, Victorino Sparrow, MD  omeprazole (PRILOSEC) 40 MG capsule Take 1 capsule (40 mg total) by mouth daily. 12/08/20   [name], Kandee Keen, NP  vortioxetine HBr (TRINTELLIX) 10 MG TABS tablet Take 1 tablet (10 mg total) by mouth daily. 12/08/20   [name], Kandee Keen, NP    Allergies Bee venom   REVIEW OF SYSTEMS  Negative except as noted here or in the History of Present Illness.   PHYSICAL EXAMINATION  Initial Vital Signs Blood pressure 126/87, pulse 91, temperature 98.8 F (37.1 C), temperature source Oral, resp. rate 18, height 5\' 11"  (1.803 m), weight 92.1 kg, SpO2 97 %.  Examination General: Well-developed, well-nourished male in no acute distress; appearance consistent with age of record HENT: normocephalic; atraumatic Eyes: pupils equal, round and reactive to light; extraocular muscles intact Neck: supple Heart: regular rate and rhythm Lungs: clear to auscultation bilaterally Chest: Point tenderness of the right lower rib cage at about the mid axillary line without palpable mass Abdomen: soft; nondistended; nontender; bowel sounds  present Extremities: No deformity; full range of motion Neurologic: Awake, alert and oriented; motor function intact in all extremities and symmetric; no facial droop Skin: Warm and dry Psychiatric: Normal mood and affect   RESULTS  Summary of this visit's results, reviewed and interpreted by myself:   EKG Interpretation  Date/Time:    Ventricular Rate:    PR Interval:    QRS Duration:   QT Interval:    QTC Calculation:   R Axis:     Text Interpretation:          Laboratory Studies: Results for orders placed or performed during the hospital encounter of 03/31/21 (from the past 24 hour(s))  Lipase, blood     Status: None   Collection Time: 03/31/21 10:48 PM  Result Value Ref Range   Lipase 15 11 - 51 U/L  Comprehensive metabolic  panel     Status: None   Collection Time: 03/31/21 10:48 PM  Result Value Ref Range   Sodium 138 135 - 145 mmol/L   Potassium 3.6 3.5 - 5.1 mmol/L   Chloride 102 98 - 111 mmol/L   CO2 29 22 - 32 mmol/L   Glucose, Bld 98 70 - 99 mg/dL   BUN 8 6 - 20 mg/dL   Creatinine, Ser 5.95 0.61 - 1.24 mg/dL   Calcium 9.8 8.9 - 39.6 mg/dL   Total Protein 7.7 6.5 - 8.1 g/dL   Albumin 4.6 3.5 - 5.0 g/dL   AST 19 15 - 41 U/L   ALT 25 0 - 44 U/L   Alkaline Phosphatase 41 38 - 126 U/L   Total Bilirubin 0.5 0.3 - 1.2 mg/dL   GFR, Estimated >72 >89 mL/min   Anion gap 7 5 - 15  CBC     Status: None   Collection Time: 03/31/21 10:48 PM  Result Value Ref Range   WBC 8.3 4.0 - 10.5 K/uL   RBC 4.96 4.22 - 5.81 MIL/uL   Hemoglobin 14.5 13.0 - 17.0 g/dL   HCT 79.1 50.4 - 13.6 %   MCV 87.5 80.0 - 100.0 fL   MCH 29.2 26.0 - 34.0 pg   MCHC 33.4 30.0 - 36.0 g/dL   RDW 43.8 37.7 - 93.9 %   Platelets 274 150 - 400 K/uL   nRBC 0.0 0.0 - 0.2 %  Urinalysis, Routine w reflex microscopic     Status: None   Collection Time: 03/31/21 10:48 PM  Result Value Ref Range   Color, Urine YELLOW YELLOW   APPearance CLEAR CLEAR   Specific Gravity, Urine 1.021 1.005 - 1.030   pH 6.5 5.0 - 8.0   Glucose, UA NEGATIVE NEGATIVE mg/dL   Hgb urine dipstick NEGATIVE NEGATIVE   Bilirubin Urine NEGATIVE NEGATIVE   Ketones, ur NEGATIVE NEGATIVE mg/dL   Protein, ur NEGATIVE NEGATIVE mg/dL   Nitrite NEGATIVE NEGATIVE   Leukocytes,Ua NEGATIVE NEGATIVE   Imaging Studies: No results found.  ED COURSE and MDM  Nursing notes, initial and subsequent vitals signs, including pulse oximetry, reviewed and interpreted by myself.  Vitals:   03/31/21 2243 03/31/21 2244  BP: 126/87   Pulse: 91   Resp: 18   Temp: 98.8 F (37.1 C)   TempSrc: Oral   SpO2: 97%   Weight:  92.1 kg  Height:  5\' 11"  (1.803 m)   Medications - No data to display  The patient states she is seeing multiple physicians concerning this pain including  urology and orthopedics.  I do not believe imaging  studies would be beneficial given that he had 2 CT scans of the abdomen and pelvis last year (most recently in September) which were unremarkable.  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  He has not had a lumbar spine MRI.  Because his pain is so well localized and is not moving I believe it represents a real condition.  This could represent nerve pain that would not show up on a CT scan.  The patient does not wish to take medications, even drugs like gabapentin.  He was advised to follow-up with his physiatrist as a nerve ablation or other injection might provide some relief.  PROCEDURES  Procedures   ED DIAGNOSES     ICD-10-CM   1. Rib pain on right side  R07.81          Donevin Sainsbury, Jonny Ruiz, MD 04/01/21 [phone]

## 2021-04-05 ENCOUNTER — Ambulatory Visit (HOSPITAL_COMMUNITY): Payer: BC Managed Care – PPO | Attending: Cardiology

## 2021-04-05 ENCOUNTER — Other Ambulatory Visit: Payer: Self-pay

## 2021-04-05 DIAGNOSIS — R002 Palpitations: Secondary | ICD-10-CM | POA: Insufficient documentation

## 2021-04-05 DIAGNOSIS — R079 Chest pain, unspecified: Secondary | ICD-10-CM | POA: Insufficient documentation

## 2021-04-05 LAB — ECHOCARDIOGRAM COMPLETE
Area-P 1/2: 2.87 cm2
S' Lateral: 3.5 cm

## 2021-05-05 ENCOUNTER — Other Ambulatory Visit: Payer: Self-pay

## 2021-05-05 ENCOUNTER — Ambulatory Visit
Admission: RE | Admit: 2021-05-05 | Discharge: 2021-05-05 | Disposition: A | Discharge status: Home / Self Care | Payer: BC Managed Care – PPO | Source: Ambulatory Visit | Attending: Neurology | Admitting: Neurology

## 2021-05-05 DIAGNOSIS — G8929 Other chronic pain: Secondary | ICD-10-CM

## 2021-06-28 ENCOUNTER — Encounter (HOSPITAL_COMMUNITY): Payer: Self-pay | Admitting: Emergency Medicine

## 2021-06-28 ENCOUNTER — Emergency Department (HOSPITAL_COMMUNITY)
Admission: EM | Admit: 2021-06-28 | Discharge: 2021-06-29 | Disposition: A | Discharge status: Home / Self Care | Payer: BC Managed Care – PPO | Attending: Emergency Medicine | Admitting: Emergency Medicine

## 2021-06-28 ENCOUNTER — Other Ambulatory Visit: Payer: Self-pay

## 2021-06-28 DIAGNOSIS — R202 Paresthesia of skin: Secondary | ICD-10-CM | POA: Diagnosis not present

## 2021-06-28 DIAGNOSIS — Z20822 Contact with and (suspected) exposure to covid-19: Secondary | ICD-10-CM | POA: Diagnosis not present

## 2021-06-28 DIAGNOSIS — R509 Fever, unspecified: Secondary | ICD-10-CM | POA: Insufficient documentation

## 2021-06-28 LAB — RESP PANEL BY RT-PCR (FLU A&B, COVID) ARPGX2
Influenza A by PCR: NEGATIVE
Influenza B by PCR: NEGATIVE
SARS Coronavirus 2 by RT PCR: NEGATIVE

## 2021-06-28 LAB — COMPREHENSIVE METABOLIC PANEL
ALT: 39 U/L (ref 0–44)
AST: 29 U/L (ref 15–41)
Albumin: 4.8 g/dL (ref 3.5–5.0)
Alkaline Phosphatase: 44 U/L (ref 38–126)
Anion gap: 7 (ref 5–15)
BUN: 13 mg/dL (ref 6–20)
CO2: 26 mmol/L (ref 22–32)
Calcium: 9.4 mg/dL (ref 8.9–10.3)
Chloride: 109 mmol/L (ref 98–111)
Creatinine, Ser: 1.02 mg/dL (ref 0.61–1.24)
GFR, Estimated: >60 mL/min (ref >60)
Glucose, Bld: 95 mg/dL (ref 70–99)
Potassium: 3.9 mmol/L (ref 3.5–5.1)
Sodium: 142 mmol/L (ref 135–145)
Total Bilirubin: 0.9 mg/dL (ref 0.3–1.2)
Total Protein: 8.2 g/dL — ABNORMAL HIGH (ref 6.5–8.1)

## 2021-06-28 LAB — CBC WITH DIFFERENTIAL/PLATELET
Abs Immature Granulocytes: 0.06 10*3/uL (ref 0.00–0.07)
Basophils Absolute: 0.1 10*3/uL (ref 0.0–0.1)
Basophils Relative: 1 %
Eosinophils Absolute: 0 10*3/uL (ref 0.0–0.5)
Eosinophils Relative: 0 %
HCT: 45.9 % (ref 39.0–52.0)
Hemoglobin: 14.7 g/dL (ref 13.0–17.0)
Immature Granulocytes: 0 %
Lymphocytes Relative: 11 %
Lymphs Abs: 1.5 10*3/uL (ref 0.7–4.0)
MCH: 28.9 pg (ref 26.0–34.0)
MCHC: 32 g/dL (ref 30.0–36.0)
MCV: 90.2 fL (ref 80.0–100.0)
Monocytes Absolute: 1.1 10*3/uL — ABNORMAL HIGH (ref 0.1–1.0)
Monocytes Relative: 8 %
Neutro Abs: 10.7 10*3/uL — ABNORMAL HIGH (ref 1.7–7.7)
Neutrophils Relative %: 80 %
Platelets: 252 10*3/uL (ref 150–400)
RBC: 5.09 MIL/uL (ref 4.22–5.81)
RDW: 11.8 % (ref 11.5–15.5)
WBC: 13.5 10*3/uL — ABNORMAL HIGH (ref 4.0–10.5)
nRBC: 0 % (ref 0.0–0.2)

## 2021-06-28 LAB — URINALYSIS, ROUTINE W REFLEX MICROSCOPIC
Bilirubin Urine: NEGATIVE
Glucose, UA: NEGATIVE mg/dL
Hgb urine dipstick: NEGATIVE
Ketones, ur: NEGATIVE mg/dL
Leukocytes,Ua: NEGATIVE
Nitrite: NEGATIVE
Protein, ur: NEGATIVE mg/dL
Specific Gravity, Urine: 1.02 (ref 1.005–1.030)
pH: 6 (ref 5.0–8.0)

## 2021-06-28 LAB — LIPASE, BLOOD: Lipase: 30 U/L (ref 11–51)

## 2021-06-28 NOTE — ED Triage Notes (Signed)
Pt reports fever/chills that started around 5 pm today. Pt had an endoscopy and coloscopy at 2 pm today. Also c/o constant right sided abdominal pain. Abdomen tender upon palpation.

## 2021-06-28 NOTE — ED Provider Notes (Signed)
Emergency Medicine Provider Triage Evaluation Note  Marcus Bullock , a 35 y.o. male  was evaluated in triage.  Pt complains of fever, abd pain after EGD and colonoscopy earlier today.  Review of Systems  Positive: Abd pain, fever Negative: nv  Physical Exam  BP 133/78 (BP Location: Left Arm)   Pulse (!) 112   Temp 100.1 F (37.8 C) (Oral)   Resp 16   Ht 5\' 11"  (1.803 m)   Wt 86.2 kg   SpO2 100%   BMI 26.50 kg/m  Gen:   Awake, no distress   Resp:  Normal effort  MSK:   Moves extremities without difficulty   Medical Decision Making  Medically screening exam initiated at 9:36 PM.  Appropriate orders placed.  Marcus Bullock was informed that the remainder of the evaluation will be completed by another provider, this initial triage assessment does not replace that evaluation, and the importance of remaining in the ED until their evaluation is complete.     Marcus Bullock 06/28/21 2136    2137, MD 06/29/21 [phone]

## 2021-06-29 NOTE — ED Provider Notes (Signed)
[facility]EMER[facility]ENC[facility] DEPT Provider Note: Lowella Dell, MD, FACEP  CSN: [id number] MRN: [id number] ARRIVAL: 06/28/21 at 2126 ROOM: WA03/WA03   CHIEF COMPLAINT  Fever and Chills   HISTORY OF PRESENT ILLNESS  06/29/21 2:54 AM Marcus Bullock is a 35 y.o. male who has had chronic right lower quadrant pain for the past several months (8 out of 10, well localized, constant and worse with palpation).  He had a colonoscopy and upper endoscopy by Dr. Bosie Clos of [facility] gastroenterology yesterday afternoon about 2 PM.  The endoscopies were uneventful.  About 5:00 that evening he developed fevers and chills.  His fever went as high as 101.3.  He has had no other symptoms with it.  Specifically no nasal congestion, sore throat, cough, shortness of breath, dysuria, nausea or vomiting.  He has also had a chronic discomfort just medial to the inferior aspect of his left scapula.  It feels like a chronic deep muscle spasm and he feels the need to rub it or scratch it which does not provide adequate relief.  There is no obvious external sign of this.   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    Past Surgical History:  Procedure Laterality Date   No past surgery      Family History  Problem Relation Age of Onset   Alcohol abuse Father    Ulcers Father    Heart disease Maternal Grandmother    Cancer Paternal Grandmother        breast   Thyroid disease Mother     Social History   Tobacco Use   Smoking status: Never   Smokeless tobacco: Never  Vaping Use   Vaping Use: Never used  Substance Use Topics   Alcohol use: Yes    Comment: Socially    Drug use: Not Currently    Prior to Admission medications   Not on File    Allergies Bee venom   REVIEW OF SYSTEMS  Negative except as noted here or in the History of  Present Illness.   PHYSICAL EXAMINATION  Initial Vital Signs Blood pressure 114/80, pulse 66, temperature 100.1 F (37.8 C), temperature source Oral, resp. rate 18, height 5\' 11"  (1.803 m), weight 86.2 kg, SpO2 99 %.  Examination General: Well-developed, well-nourished male in no acute distress; appearance consistent with age of record HENT: normocephalic; atraumatic Eyes: pupils equal, round and reactive to light; extraocular muscles intact Neck: supple Heart: regular rate and rhythm Lungs: clear to auscultation bilaterally Abdomen: soft; nondistended; right lateral lower quadrant tenderness at location of chronic pain; bowel sounds present Back: No lesion or tenderness at site of reported discomfort Extremities: No deformity; full range of motion; pulses normal Neurologic: Awake, alert and oriented; motor function intact in all extremities and symmetric; no facial droop Skin: Warm and dry Psychiatric: Normal mood and affect   RESULTS  Summary of this visit's results, reviewed and interpreted by myself:   EKG Interpretation  Date/Time:    Ventricular Rate:    PR Interval:    QRS Duration:   QT Interval:    QTC Calculation:   R Axis:     Text Interpretation:         Laboratory Studies: Results for orders placed or performed during the  hospital encounter of 06/28/21 (from the past 24 hour(s))  CBC with Differential     Status: Abnormal   Collection Time: 06/28/21 10:06 PM  Result Value Ref Range   WBC 13.5 (H) 4.0 - 10.5 K/uL   RBC 5.09 4.22 - 5.81 MIL/uL   Hemoglobin 14.7 13.0 - 17.0 g/dL   HCT 67.3 41.9 - 37.9 %   MCV 90.2 80.0 - 100.0 fL   MCH 28.9 26.0 - 34.0 pg   MCHC 32.0 30.0 - 36.0 g/dL   RDW 02.4 09.7 - 35.3 %   Platelets 252 150 - 400 K/uL   nRBC 0.0 0.0 - 0.2 %   Neutrophils Relative % 80 %   Neutro Abs 10.7 (H) 1.7 - 7.7 K/uL   Lymphocytes Relative 11 %   Lymphs Abs 1.5 0.7 - 4.0 K/uL   Monocytes Relative 8 %   Monocytes Absolute 1.1 (H) 0.1 -  1.0 K/uL   Eosinophils Relative 0 %   Eosinophils Absolute 0.0 0.0 - 0.5 K/uL   Basophils Relative 1 %   Basophils Absolute 0.1 0.0 - 0.1 K/uL   Immature Granulocytes 0 %   Abs Immature Granulocytes 0.06 0.00 - 0.07 K/uL  Comprehensive metabolic panel     Status: Abnormal   Collection Time: 06/28/21 10:06 PM  Result Value Ref Range   Sodium 142 135 - 145 mmol/L   Potassium 3.9 3.5 - 5.1 mmol/L   Chloride 109 98 - 111 mmol/L   CO2 26 22 - 32 mmol/L   Glucose, Bld 95 70 - 99 mg/dL   BUN 13 6 - 20 mg/dL   Creatinine, Ser 2.99 0.61 - 1.24 mg/dL   Calcium 9.4 8.9 - 24.2 mg/dL   Total Protein 8.2 (H) 6.5 - 8.1 g/dL   Albumin 4.8 3.5 - 5.0 g/dL   AST 29 15 - 41 U/L   ALT 39 0 - 44 U/L   Alkaline Phosphatase 44 38 - 126 U/L   Total Bilirubin 0.9 0.3 - 1.2 mg/dL   GFR, Estimated >68 >34 mL/min   Anion gap 7 5 - 15  Lipase, blood     Status: None   Collection Time: 06/28/21 10:06 PM  Result Value Ref Range   Lipase 30 11 - 51 U/L  Urinalysis, Routine w reflex microscopic     Status: None   Collection Time: 06/28/21 10:13 PM  Result Value Ref Range   Color, Urine YELLOW YELLOW   APPearance CLEAR CLEAR   Specific Gravity, Urine 1.020 1.005 - 1.030   pH 6.0 5.0 - 8.0   Glucose, UA NEGATIVE NEGATIVE mg/dL   Hgb urine dipstick NEGATIVE NEGATIVE   Bilirubin Urine NEGATIVE NEGATIVE   Ketones, ur NEGATIVE NEGATIVE mg/dL   Protein, ur NEGATIVE NEGATIVE mg/dL   Nitrite NEGATIVE NEGATIVE   Leukocytes,Ua NEGATIVE NEGATIVE  Resp Panel by RT-PCR (Flu A&B, Covid) Nasopharyngeal Swab     Status: None   Collection Time: 06/28/21 10:13 PM   Specimen: Nasopharyngeal Swab; Nasopharyngeal(NP) swabs in vial transport medium  Result Value Ref Range   SARS Coronavirus 2 by RT PCR NEGATIVE NEGATIVE   Influenza A by PCR NEGATIVE NEGATIVE   Influenza B by PCR NEGATIVE NEGATIVE   Imaging Studies: No results found.  ED COURSE and MDM  Nursing notes, initial and subsequent vitals signs, including  pulse oximetry, reviewed and interpreted by myself.  Vitals:   06/28/21 2134 06/29/21 0029 06/29/21 0030  BP: 133/78 119/78 114/80  Pulse: (!) 112 76  66  Resp: 16 18   Temp: 100.1 F (37.8 C)    TempSrc: Oral    SpO2: 100% 100% 99%  Weight: 86.2 kg    Height: 5\' 11"  (1.803 m)     Medications - No data to display  I suspect the patient's fever and chills were due to transient bacteremia following his endoscopy and colonoscopy.  He appears to be well now apart from a mild leukocytosis and mild fever on arrival.  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  PROCEDURES  Procedures   ED DIAGNOSES     ICD-10-CM   1. 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

## 2021-06-29 NOTE — ED Notes (Signed)
Pt declined to sign for d/c but expressed understanding of discharge instructions. All questions were answered at time of d/c

## 2021-07-21 ENCOUNTER — Encounter: Payer: Self-pay | Admitting: Adult Health

## 2021-10-25 ENCOUNTER — Other Ambulatory Visit: Payer: Self-pay | Admitting: Orthopaedic Surgery

## 2021-10-25 DIAGNOSIS — M25572 Pain in left ankle and joints of left foot: Secondary | ICD-10-CM

## 2021-10-31 ENCOUNTER — Ambulatory Visit
Admission: RE | Admit: 2021-10-31 | Discharge: 2021-10-31 | Disposition: A | Discharge status: Home / Self Care | Payer: BC Managed Care – PPO | Source: Ambulatory Visit | Attending: Orthopaedic Surgery | Admitting: Orthopaedic Surgery

## 2021-10-31 DIAGNOSIS — M25572 Pain in left ankle and joints of left foot: Secondary | ICD-10-CM

## 2022-02-10 ENCOUNTER — Encounter: Payer: BC Managed Care – PPO | Admitting: Adult Health

## 2022-02-10 NOTE — Progress Notes (Deleted)
   Subjective:    Patient ID: Marcus Bullock, male    DOB: 1986-02-01, 36 y.o.   MRN: [id number]  HPI Patient presents for yearly preventative medicine examination. He is a 36 year old male who  has a past medical history of Back pain, Depression, Left ankle sprain, Neck pain, Numbness and tingling, and Skin irritation.     All immunizations and health maintenance protocols were reviewed with the patient and needed orders were placed.  Appropriate screening laboratory values were ordered for the patient including screening of hyperlipidemia, renal function and hepatic function. If indicated by BPH, a PSA was ordered.  Medication reconciliation,  past medical history, social history, problem list and allergies were reviewed in detail with the patient  Goals were established with regard to weight loss, exercise, and  diet in compliance with medications  End of life planning was discussed.    Review of Systems     Objective:   Physical Exam        Assessment & Plan:

## 2022-03-01 ENCOUNTER — Encounter: Payer: BC Managed Care – PPO | Admitting: Adult Health

## 2022-03-17 ENCOUNTER — Encounter: Payer: Self-pay | Admitting: Adult Health

## 2022-03-17 ENCOUNTER — Ambulatory Visit (INDEPENDENT_AMBULATORY_CARE_PROVIDER_SITE_OTHER): Payer: BC Managed Care – PPO | Admitting: Adult Health

## 2022-03-17 ENCOUNTER — Other Ambulatory Visit (HOSPITAL_COMMUNITY)
Admission: RE | Admit: 2022-03-17 | Discharge: 2022-03-17 | Disposition: A | Discharge status: Home / Self Care | Payer: BC Managed Care – PPO | Source: Ambulatory Visit | Attending: Adult Health | Admitting: Adult Health

## 2022-03-17 ENCOUNTER — Encounter: Payer: BC Managed Care – PPO | Admitting: Adult Health

## 2022-03-17 VITALS — BP 110/80 | HR 88 | Temp 99.0°F | Ht 71.0 in | Wt 196.0 lb

## 2022-03-17 DIAGNOSIS — Z23 Encounter for immunization: Secondary | ICD-10-CM

## 2022-03-17 DIAGNOSIS — Z113 Encounter for screening for infections with a predominantly sexual mode of transmission: Secondary | ICD-10-CM | POA: Insufficient documentation

## 2022-03-17 DIAGNOSIS — R5383 Other fatigue: Secondary | ICD-10-CM | POA: Diagnosis not present

## 2022-03-17 DIAGNOSIS — G894 Chronic pain syndrome: Secondary | ICD-10-CM

## 2022-03-17 DIAGNOSIS — K2100 Gastro-esophageal reflux disease with esophagitis, without bleeding: Secondary | ICD-10-CM

## 2022-03-17 DIAGNOSIS — Z0000 Encounter for general adult medical examination without abnormal findings: Secondary | ICD-10-CM | POA: Diagnosis present

## 2022-03-17 LAB — LIPID PANEL
Cholesterol: 155 mg/dL (ref 0–200)
HDL: 53.5 mg/dL (ref >39.00)
LDL Cholesterol: 86 mg/dL (ref 0–99)
NonHDL: 101.36
Total CHOL/HDL Ratio: 3
Triglycerides: 75 mg/dL (ref 0.0–149.0)
VLDL: 15 mg/dL (ref 0.0–40.0)

## 2022-03-17 LAB — COMPREHENSIVE METABOLIC PANEL
ALT: 23 U/L (ref 0–53)
AST: 21 U/L (ref 0–37)
Albumin: 4.6 g/dL (ref 3.5–5.2)
Alkaline Phosphatase: 48 U/L (ref 39–117)
BUN: 9 mg/dL (ref 6–23)
CO2: 28 mEq/L (ref 19–32)
Calcium: 9.8 mg/dL (ref 8.4–10.5)
Chloride: 102 mEq/L (ref 96–112)
Creatinine, Ser: 1.07 mg/dL (ref 0.40–1.50)
GFR: 89.12 mL/min (ref >60.00)
Glucose, Bld: 83 mg/dL (ref 70–99)
Potassium: 4 mEq/L (ref 3.5–5.1)
Sodium: 138 mEq/L (ref 135–145)
Total Bilirubin: 0.5 mg/dL (ref 0.2–1.2)
Total Protein: 8 g/dL (ref 6.0–8.3)

## 2022-03-17 LAB — TSH: TSH: 2.64 u[IU]/mL (ref 0.35–5.50)

## 2022-03-17 LAB — CBC WITH DIFFERENTIAL/PLATELET
Basophils Absolute: 0 10*3/uL (ref 0.0–0.1)
Basophils Relative: 0.7 % (ref 0.0–3.0)
Eosinophils Absolute: 0.1 10*3/uL (ref 0.0–0.7)
Eosinophils Relative: 1.1 % (ref 0.0–5.0)
HCT: 43.7 % (ref 39.0–52.0)
Hemoglobin: 14.3 g/dL (ref 13.0–17.0)
Lymphocytes Relative: 25.9 % (ref 12.0–46.0)
Lymphs Abs: 1.5 10*3/uL (ref 0.7–4.0)
MCHC: 32.6 g/dL (ref 30.0–36.0)
MCV: 88.2 fl (ref 78.0–100.0)
Monocytes Absolute: 0.6 10*3/uL (ref 0.1–1.0)
Monocytes Relative: 10.2 % (ref 3.0–12.0)
Neutro Abs: 3.6 10*3/uL (ref 1.4–7.7)
Neutrophils Relative %: 62.1 % (ref 43.0–77.0)
Platelets: 232 10*3/uL (ref 150.0–400.0)
RBC: 4.96 Mil/uL (ref 4.22–5.81)
RDW: 12.5 % (ref 11.5–15.5)
WBC: 5.8 10*3/uL (ref 4.0–10.5)

## 2022-03-17 LAB — VITAMIN B12: Vitamin B-12: 741 pg/mL (ref 211–911)

## 2022-03-17 LAB — MAGNESIUM: Magnesium: 1.9 mg/dL (ref 1.5–2.5)

## 2022-03-17 NOTE — Progress Notes (Unsigned)
Subjective:    Patient ID: Marcus Bullock, male    DOB: Feb 21, 1986, 36 y.o.   MRN: [id number]  HPI Patient presents for yearly preventative medicine examination. He is a pleasant 36 year old male who  has a past medical history of Back pain, Depression, Left ankle sprain, Neck pain, Numbness and tingling, and Skin irritation.  Mid Back pain/low back pain/Fibromylagia/Myofascial pain syndrome -has been seen by multiple specialists including rheumatology, neurosurgery, neurology, and physical medicine and rehab.  He has gone through multiple treatments including physical therapy, acupuncture, and dry needling without improvement.  Cervical and thoracic MRI, EMG/NCV have been negative.  He has been trialed on duloxetine and gabapentin and both were stopped because he did not like the way they made him feel.  He was then trialed on Lyrica 25 mg twice daily and titrated up slowly but stopped taking this as well.  Today he reports that he feels as though his his upper mid back symptoms have been improving but he still has the myofascial pain and fatigue.  He has been seen at some integrative medicine practices and they have had him on sounds like vitamin therapies.  Chest Tightness -is an ongoing issue as well, has been worked up by cardiology with stress test, echo, and Zio patch without finding cardiac cause.  GERD- takes OTC Prilosec. Symptoms are well controlled for the most part   STD Screening - asymptomatic and is not concerned but would like screening    All immunizations and health maintenance protocols were reviewed with the patient and needed orders were placed.  Appropriate screening laboratory values were ordered for the patient including screening of hyperlipidemia, renal function and hepatic function. If indicated by BPH, a PSA was ordered.  Medication reconciliation,  past medical history, social history, problem list and allergies were reviewed in detail with the  patient  Goals were established with regard to weight loss, exercise, and  diet in compliance with medications  Wt Readings from Last 3 Encounters:  03/17/22 196 lb (88.9 kg)  06/28/21 190 lb (86.2 kg)  03/31/21 203 lb (92.1 kg)    Review of Systems  Constitutional:  Positive for fatigue.  HENT: Negative.    Eyes: Negative.   Respiratory:  Positive for chest tightness.   Cardiovascular: Negative.   Gastrointestinal: Negative.   Endocrine: Negative.   Genitourinary: Negative.   Musculoskeletal:  Positive for myalgias.  Skin: Negative.   Allergic/Immunologic: Negative.   Neurological: Negative.   Hematological: Negative.   Psychiatric/Behavioral: Negative.    All other systems reviewed and are negative.   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    Social History   Socioeconomic History   Marital status: Single    Spouse name: Not on file   Number of children: 0   Years of education: college   Highest education level: Bachelor's degree (e.g., BA, AB, BS)  Occupational History   Occupation: 5th grade teacher  Tobacco Use   Smoking status: Never   Smokeless tobacco: Never  Vaping Use   Vaping Use: Never used  Substance and Sexual Activity   Alcohol use: Yes    Comment: Socially    Drug use: Not Currently   Sexual activity: Yes    Birth  control/protection: None    Comment: male partner  Other Topics Concern   Not on file  Social History Narrative   Tourist information centre manager    Not married    No kids      Likes to workout and bowl.   Right-handed.   One cup tea per day.   Lives alone.      Social Determinants of Health   Financial Resource Strain: Not on file  Food Insecurity: Not on file  Transportation Needs: Not on file  Physical Activity: Not on file  Stress: Not on file  Social  Connections: Not on file  Intimate Partner Violence: Not on file    Past Surgical History:  Procedure Laterality Date   No past surgery      Family History  Problem Relation Age of Onset   Alcohol abuse Father    Ulcers Father    Heart disease Maternal Grandmother    Cancer Paternal Grandmother        breast   Thyroid disease Mother     Allergies  Allergen Reactions   Bee Venom Swelling    No current outpatient medications on file prior to visit.   No current facility-administered medications on file prior to visit.    There were no vitals taken for this visit.      Objective:   Physical Exam Vitals and nursing note reviewed.  Constitutional:      General: He is not in acute distress.    Appearance: Normal appearance. He is well-developed and normal weight.  HENT:     Head: Normocephalic and atraumatic.     Right Ear: Tympanic membrane, ear canal and external ear normal. There is no impacted cerumen.     Left Ear: Tympanic membrane, ear canal and external ear normal. There is no impacted cerumen.     Nose: Nose normal. No congestion or rhinorrhea.     Mouth/Throat:     Mouth: Mucous membranes are moist.     Pharynx: Oropharynx is clear. No oropharyngeal exudate or posterior oropharyngeal erythema.  Eyes:     General:        Right eye: No discharge.        Left eye: No discharge.     Extraocular Movements: Extraocular movements intact.     Conjunctiva/sclera: Conjunctivae normal.     Pupils: Pupils are equal, round, and reactive to light.  Neck:     Vascular: No carotid bruit.     Trachea: No tracheal deviation.  Cardiovascular:     Rate and Rhythm: Normal rate and regular rhythm.     Pulses: Normal pulses.     Heart sounds: Normal heart sounds. No murmur heard.    No friction rub. No gallop.  Pulmonary:     Effort: Pulmonary effort is normal. No respiratory distress.     Breath sounds: Normal breath sounds. No stridor. No wheezing, rhonchi or rales.   Chest:     Chest wall: No tenderness.  Abdominal:     General: Bowel sounds are normal. There is no distension.     Palpations: Abdomen is soft. There is no mass.     Tenderness: There is no abdominal tenderness. There is no right CVA tenderness, left CVA tenderness, guarding or rebound.     Hernia: No hernia is present.  Musculoskeletal:        General: Tenderness (throughout upper back) present. No swelling, deformity or signs of injury. Normal range of motion.  Right lower leg: No edema.     Left lower leg: No edema.  Lymphadenopathy:     Cervical: No cervical adenopathy.  Skin:    General: Skin is warm and dry.     Capillary Refill: Capillary refill takes less than 2 seconds.     Coloration: Skin is not jaundiced or pale.     Findings: No bruising, erythema, lesion or rash.  Neurological:     General: No focal deficit present.     Mental Status: He is alert and oriented to person, place, and time.     Cranial Nerves: No cranial nerve deficit.     Sensory: No sensory deficit.     Motor: No weakness.     Coordination: Coordination normal.     Gait: Gait normal.     Deep Tendon Reflexes: Reflexes normal.  Psychiatric:        Mood and Affect: Mood normal.        Behavior: Behavior normal.        Thought Content: Thought content normal.        Judgment: Judgment normal.       Assessment & Plan:  1. Routine general medical examination at a health care facility - Follow up in one year or sooner if needed - CBC with Differential/Platelet; Future - Comprehensive metabolic panel; Future - Lipid panel; Future - TSH; Future - Magnesium; Future - HIV Antibody (routine testing w rflx); Future - RPR; Future - Urine cytology ancillary only; Future - Urine cytology ancillary only - RPR - HIV Antibody (routine testing w rflx) - Magnesium - TSH - Lipid panel - Comprehensive metabolic panel - CBC with Differential/Platelet  2. Other fatigue - Unknown cause  - CBC with  Differential/Platelet; Future - Comprehensive metabolic panel; Future - Lipid panel; Future - TSH; Future - Magnesium; Future - Vitamin B12; Future - Testosterone,Free and Total; Future - Testosterone,Free and Total - Vitamin B12 - Magnesium - TSH - Lipid panel - Comprehensive metabolic panel - CBC with Differential/Platelet  3. Screening examination for STD (sexually transmitted disease)  - HIV Antibody (routine testing w rflx); Future - RPR; Future - Urine cytology ancillary only; Future - Urine cytology ancillary only - RPR - HIV Antibody (routine testing w rflx)  4. Need for diphtheria-tetanus-pertussis (Tdap) vaccine  - Tdap vaccine greater than or equal to 7yo IM  5. Gastroesophageal reflux disease with esophagitis without hemorrhage - continue PPI  6. Chronic pain syndrome - myofacial pain vs fibromyalgia.  - Does not want medication at this time - Continue with conservative management    Shirline Frees, NP

## 2022-03-19 LAB — URINE CYTOLOGY ANCILLARY ONLY
Chlamydia: NEGATIVE
Comment: NEGATIVE
Comment: NEGATIVE
Comment: NORMAL
Neisseria Gonorrhea: NEGATIVE
Trichomonas: NEGATIVE

## 2022-03-20 LAB — RPR: RPR Ser Ql: NONREACTIVE

## 2022-03-20 LAB — HIV ANTIBODY (ROUTINE TESTING W REFLEX): HIV 1&2 Ab, 4th Generation: NONREACTIVE

## 2022-03-21 ENCOUNTER — Encounter: Payer: Self-pay | Admitting: Adult Health

## 2022-03-21 NOTE — Telephone Encounter (Signed)
FYI

## 2022-03-22 LAB — TESTOSTERONE,FREE AND TOTAL
Testosterone, Free: 15.9 pg/mL (ref 8.7–25.1)
Testosterone: 970 ng/dL — ABNORMAL HIGH (ref 264–916)

## 2022-03-23 ENCOUNTER — Other Ambulatory Visit: Payer: Self-pay | Admitting: Adult Health

## 2022-03-23 DIAGNOSIS — R5383 Other fatigue: Secondary | ICD-10-CM

## 2022-05-04 ENCOUNTER — Ambulatory Visit (INDEPENDENT_AMBULATORY_CARE_PROVIDER_SITE_OTHER): Payer: BC Managed Care – PPO | Admitting: Adult Health

## 2022-05-04 ENCOUNTER — Encounter: Payer: Self-pay | Admitting: Adult Health

## 2022-05-04 ENCOUNTER — Ambulatory Visit (INDEPENDENT_AMBULATORY_CARE_PROVIDER_SITE_OTHER): Payer: BC Managed Care – PPO

## 2022-05-04 VITALS — BP 120/68 | HR 95 | Temp 98.9°F | Ht 71.0 in | Wt 197.4 lb

## 2022-05-04 DIAGNOSIS — M62838 Other muscle spasm: Secondary | ICD-10-CM | POA: Diagnosis not present

## 2022-05-04 DIAGNOSIS — R0683 Snoring: Secondary | ICD-10-CM | POA: Diagnosis not present

## 2022-05-04 DIAGNOSIS — G4733 Obstructive sleep apnea (adult) (pediatric): Secondary | ICD-10-CM

## 2022-05-04 DIAGNOSIS — R0781 Pleurodynia: Secondary | ICD-10-CM

## 2022-05-04 NOTE — Assessment & Plan Note (Signed)
Snoring, daytime sleepiness, restless sleep, witnessed apneic events, daytime fatigue all concerning for underlying sleep apnea.  Patient be set up for home sleep study.  Patient education was given.  - discussed how weight can impact sleep and risk for sleep disordered breathing - discussed options to assist with weight loss: combination of diet modification, cardiovascular and strength training exercises   - had an extensive discussion regarding the adverse health consequences related to untreated sleep disordered breathing - specifically discussed the risks for hypertension, coronary artery disease, cardiac dysrhythmias, cerebrovascular disease, and diabetes - lifestyle modification discussed   - discussed how sleep disruption can increase risk of accidents, particularly when driving - safe driving practices were discussed   Plan  Patient Instructions  Chest xray today .  Warm compresses to back area  follow up with Primary MD if back pain does not resolve   Set up home sleep study  Healthy sleep regimen  Do not drive if sleepy  Work on healthy weight  Follow up in  2 months to discuss results and treatment plan

## 2022-05-04 NOTE — Patient Instructions (Signed)
Chest xray today .  Warm compresses to back area  follow up with Primary MD if back pain does not resolve   Set up home sleep study  Healthy sleep regimen  Do not drive if sleepy  Work on healthy weight  Follow up in  2 months to discuss results and treatment plan

## 2022-05-04 NOTE — Assessment & Plan Note (Signed)
Suspected muscle spasm in posterior thorax.  Has some intermittent pleuritic symptoms.  Patient has no cough or shortness of breath.  We will check chest x-ray to complete work-up.  Patient is advised if symptoms worsen or continue will need further evaluation Advised to use warm heat and conservative therapy.  Continue follow-up with primary care  Plan  Patient Instructions  Chest xray today .  Warm compresses to back area  follow up with Primary MD if back pain does not resolve   Set up home sleep study  Healthy sleep regimen  Do not drive if sleepy  Work on healthy weight  Follow up in  2 months to discuss results and treatment plan      Please contact office for sooner follow up if symptoms do not improve or worsen or seek emergency care

## 2022-05-04 NOTE — Progress Notes (Signed)
Reviewed and agree with assessment/plan.   Coralyn Helling, MD [facility] Pulmonary/Critical Care 05/04/2022, 7:20 PM Pager:  [phone]

## 2022-05-04 NOTE — Progress Notes (Signed)
'@Patient'  ID: Marcus Bullock, male    DOB: 1986-08-21, 36 y.o.   MRN: [id number]  Chief Complaint  Patient presents with   Consult    Referring provider: [name], NP  HPI: 36 year old male seen for sleep consult May 04, 2022 for snoring, daytime sleepiness, witnessed apneic events and restless sleep   TEST/EVENTS :   05/04/2022 Sleep consult  Patient presents for a sleep consult today.  Kindly referred by primary care provider [name], NP.  Patient complains that no matter how much sleep he gets he always feels tired.  He wakes up unrefreshed.  Patient says he has very loud snoring that is interfering with his significant other sleep.  His girlfriend has noticed that he has very loud snoring episodes and episodes where he stops breathing or gasp for air.  Patient says he always wakes up tired.  Feels tired throughout the day.  No matter how much sleep he gets and feels that he does sleep but just always feels tired.  Typically goes to bed about 10 PM.  Goes to sleep very easily.  Is up maybe once or twice throughout the night.  And gets up about 7 AM.  Patient's weight is up 5 pounds over the last 2 years current weight at 197 with a BMI at 27.  Patient is never had a previous sleep study.  He does not use any sleep aids currently.  Has tried melatonin in the past without much improvement.  No symptoms suspicious for cataplexy or sleep paralysis.  Patient has no removable dental work.  Patient says he has lots of muscle and joint discomfort.  Has been seen by rheumatology before and told that he might have fibromyalgia.  He has minimal caffeine intake.  No history of congestive heart failure or stroke.  No removable dental work.  Patient also complains that he gets some pleuritic-like pain in the posterior back between his shoulder blades when he takes in a deep breath.  Aching seems to come and go.  Patient denies any hemoptysis, chest pain, orthopnea, syncope.  Allergies   Allergen Reactions   Bee Venom Swelling    Immunization History  Administered Date(s) Administered   DTaP 08/10/1985, 10/20/1985, 12/05/1985, 12/08/1986, 08/09/1990   HiB (PRP-OMP) 06/29/1987   HiB (PRP-T) 06/29/1987   IPV 08/10/1985, 10/20/1985, 12/08/1986, 08/09/1990   Influenza Split 10/01/2014, 11/23/2015, 08/06/2019   Influenza,inj,Quad PF,6+ Mos 10/01/2014, 11/23/2015, 08/06/2019   Influenza-Unspecified 10/01/2014, 11/23/2015, 08/06/2019   MMR 10/19/1986, 10/18/2011   PFIZER(Purple Top)SARS-COV-2 Vaccination 12/04/2019, 12/26/2019, 12/30/2020   PPD Test 11/23/2015, 11/23/2015, 03/27/2017, 03/27/2017   Tdap 10/18/2011, 03/17/2022    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   No facility-administered medications prior to visit.     Review of Systems:   Constitutional:   No  weight loss, night sweats,  Fevers, chills,  +fatigue, or  lassitude.  HEENT:   No headaches,  Difficulty swallowing,  Tooth/dental problems, or  Sore throat,                No sneezing,  itching, ear ache, nasal congestion, post nasal drip,   CV:  No chest pain,  Orthopnea, PND, swelling in lower extremities, anasarca, dizziness, palpitations, syncope.   GI  No heartburn, indigestion, abdominal pain, nausea, vomiting, diarrhea, change in bowel habits, loss of appetite, bloody stools.   Resp: No shortness of breath with exertion or at rest.  No excess mucus, no productive cough,  No non-productive cough,  No coughing up of blood.  No change in color of mucus.  No wheezing.  No chest wall deformity  Skin: no rash or lesions.  GU: no dysuria, change in  color of urine, no urgency or frequency.  No flank pain, no hematuria   MS:  No joint pain or swelling.  No decreased range of motion.  + back pain.    Physical Exam  BP 120/68 (BP Location: Left Arm, Patient Position: Sitting, Cuff Size: Normal)   Pulse 95   Temp 98.9 F (37.2 C) (Oral)   Ht '5\' 11"'  (1.803 m)   Wt 197 lb 6.4 oz (89.5 kg)   SpO2 97%   BMI 27.53 kg/m   GEN: A/Ox3; pleasant , NAD, well nourished    HEENT:  [place]/AT,  , NOSE-clear, THROAT-clear, no lesions, no postnasal drip or exudate noted. Class 3 MP airway   NECK:  Supple w/ fair ROM; no JVD; normal carotid impulses w/o bruits; no thyromegaly or nodules palpated; no lymphadenopathy.    RESP  Clear  P & A; w/o, wheezes/ rales/ or rhonchi. no accessory muscle use, no dullness to percussion  CARD:  RRR, no m/r/g, no peripheral edema, pulses intact, no cyanosis or clubbing.  GI:   Soft & nt; nml bowel sounds; no organomegaly or masses detected.   Musco: Warm bil, no deformities or joint swelling noted. Tender along posterior thorax, no deformity noted. No ecchymosis.   Neuro: alert, no focal deficits noted.    Skin: Warm, no lesions or rashes    Lab Results:    BNP No results found for: "BNP"  ProBNP No results found for: "PROBNP"  Imaging: No results found.        No data to display          No results found for: "NITRICOXIDE"      Assessment & Plan:   Snoring Snoring, daytime sleepiness, restless sleep, witnessed apneic events, daytime fatigue all concerning for underlying sleep apnea.  Patient be set up for home sleep study.  Patient education was given.  - discussed how weight can impact sleep and risk for sleep disordered breathing - discussed options to assist with weight loss: combination of diet modification, cardiovascular and strength training exercises   - had an extensive discussion regarding the adverse health consequences related to untreated sleep disordered  breathing - specifically discussed the risks for hypertension, coronary artery disease, cardiac dysrhythmias, cerebrovascular disease, and diabetes - lifestyle modification discussed   - discussed how sleep disruption can increase risk of accidents, particularly when driving - safe driving practices were discussed   Plan  Patient Instructions  Chest xray today .  Warm compresses to back area  follow up with Primary MD if back pain does not resolve   Set up home sleep study  Healthy sleep regimen  Do not drive if sleepy  Work on healthy weight  Follow up in  2 months to discuss results and treatment plan       Muscle spasm Suspected muscle spasm in posterior thorax.  Has some intermittent pleuritic  symptoms.  Patient has no cough or shortness of breath.  We will check chest x-ray to complete work-up.  Patient is advised if symptoms worsen or continue will need further evaluation Advised to use warm heat and conservative therapy.  Continue follow-up with primary care  Plan  Patient Instructions  Chest xray today .  Warm compresses to back area  follow up with Primary MD if back pain does not resolve   Set up home sleep study  Healthy sleep regimen  Do not drive if sleepy  Work on healthy weight  Follow up in  2 months to discuss results and treatment plan      Please contact office for sooner follow up if symptoms do not improve or worsen or seek emergency care      Rexene Edison, NP 05/04/2022

## 2022-05-18 ENCOUNTER — Ambulatory Visit: Payer: BC Managed Care – PPO | Admitting: Adult Health

## 2022-05-19 ENCOUNTER — Ambulatory Visit: Payer: BC Managed Care – PPO | Admitting: Adult Health

## 2022-05-19 ENCOUNTER — Ambulatory Visit (INDEPENDENT_AMBULATORY_CARE_PROVIDER_SITE_OTHER): Payer: BC Managed Care – PPO | Admitting: Adult Health

## 2022-05-19 ENCOUNTER — Encounter: Payer: Self-pay | Admitting: Adult Health

## 2022-05-19 VITALS — BP 110/60 | HR 82 | Temp 98.8°F | Wt 191.0 lb

## 2022-05-19 DIAGNOSIS — J029 Acute pharyngitis, unspecified: Secondary | ICD-10-CM | POA: Diagnosis not present

## 2022-05-19 LAB — POCT RAPID STREP A (OFFICE): Rapid Strep A Screen: POSITIVE — AB

## 2022-05-19 LAB — POC COVID19 BINAXNOW

## 2022-05-19 MED ORDER — PENICILLIN V POTASSIUM 500 MG PO TABS: 500.0000 mg | ORAL_TABLET | Freq: Two times a day (BID) | ORAL | 0 refills | Status: AC

## 2022-05-19 MED ORDER — METHYLPREDNISOLONE 4 MG PO TBPK: ORAL_TABLET | ORAL | 0 refills | Status: DC

## 2022-05-19 NOTE — Progress Notes (Signed)
Subjective:    Patient ID: Marcus Bullock, male    DOB: 1986-01-24, 36 y.o.   MRN: [id number]  HPI 36 year old male who  has a past medical history of Back pain, Depression, Left ankle sprain, Neck pain, Numbness and tingling, and Skin irritation.  He presents to the office today for an acute issue of sore throat that started five days ago. Throughout the week he started to developed painful swallowing, chills, and headache.   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    Social History   Socioeconomic History   Marital status: Single    Spouse name: Not on file   Number of children: 0   Years of education: college   Highest education level: Bachelor's degree (e.g., BA, AB, BS)  Occupational History   Occupation: 5th grade teacher  Tobacco Use   Smoking status: Never   Smokeless tobacco: Never  Vaping Use   Vaping Use: Never used  Substance and Sexual Activity   Alcohol use: Yes    Comment: Socially    Drug use: Not Currently   Sexual activity: Yes    Birth control/protection: None    Comment: male partner  Other Topics Concern   Not on file  Social History Narrative   Tourist information centre manager    Not married    No kids      Likes to workout and bowl.   Right-handed.   One cup tea per day.   Lives alone.      Social Determinants of Health   Financial Resource Strain: Not on file  Food Insecurity: Not on file  Transportation Needs: Not on file  Physical Activity: Not on file  Stress: Not on file  Social Connections: Not on file  Intimate Partner Violence: Not on file    Past Surgical History:  Procedure Laterality Date   No past surgery      Family History  Problem Relation Age of Onset   Alcohol abuse Father     Ulcers Father    Heart disease Maternal Grandmother    Cancer Paternal Grandmother        breast   Thyroid disease Mother     Allergies  Allergen Reactions   Bee Venom Swelling    No current outpatient medications on file prior to visit.   No current facility-administered medications on file prior to visit.    BP 110/60   Pulse 82   Temp 98.8 F (37.1 C)   Wt 191 lb (86.6 kg)   SpO2 97%   BMI 26.64 kg/m       Objective:   Physical Exam Vitals and nursing note reviewed.  Constitutional:      Appearance: He is well-developed.  HENT:     Nose: Nose normal.     Mouth/Throat:     Mouth: Mucous membranes are moist.     Pharynx: Oropharynx is clear. Posterior oropharyngeal erythema present.     Tonsils: Tonsillar exudate present. 2+ on the right. 2+ on the left.  Cardiovascular:     Rate and Rhythm: Normal rate and regular rhythm.     Pulses: Normal  pulses.     Heart sounds: Normal heart sounds.  Pulmonary:     Effort: Pulmonary effort is normal.     Breath sounds: Normal breath sounds.  Musculoskeletal:        General: Normal range of motion.  Skin:    General: Skin is warm and dry.     Capillary Refill: Capillary refill takes less than 2 seconds.  Neurological:     General: No focal deficit present.     Mental Status: He is alert and oriented to person, place, and time.  Psychiatric:        Mood and Affect: Mood normal.        Behavior: Behavior normal.        Thought Content: Thought content normal.        Judgment: Judgment normal.       Assessment & Plan:  1. Sore throat  - POC Rapid Strep A- positive - POC COVID-19- Negative - penicillin v potassium (VEETID) 500 MG tablet; Take 1 tablet (500 mg total) by mouth 2 (two) times daily for 10 days.  Dispense: 20 tablet; Refill: 0 - methylPREDNISolone (MEDROL DOSEPAK) 4 MG TBPK tablet; Take as directed  Dispense: 21 tablet; Refill: 0 - Follow up if not improving in the next 2-3 days   Shirline Frees,  NP

## 2022-06-29 ENCOUNTER — Encounter: Payer: Self-pay | Admitting: Adult Health

## 2022-06-29 NOTE — Telephone Encounter (Signed)
Spoke to patient and he stated that he has a hx of headaches that are getting worse. Pt is wanting a MRI done of his head. Pt has been scheduled.

## 2022-06-30 ENCOUNTER — Encounter: Payer: Self-pay | Admitting: Adult Health

## 2022-06-30 ENCOUNTER — Ambulatory Visit: Payer: 59 | Admitting: Adult Health

## 2022-06-30 VITALS — BP 110/70 | HR 92 | Temp 98.9°F | Ht 71.0 in | Wt 192.0 lb

## 2022-06-30 DIAGNOSIS — R519 Headache, unspecified: Secondary | ICD-10-CM

## 2022-06-30 DIAGNOSIS — R5383 Other fatigue: Secondary | ICD-10-CM | POA: Diagnosis not present

## 2022-06-30 LAB — POCT INFLUENZA A/B

## 2022-06-30 LAB — POC COVID19 BINAXNOW: SARS Coronavirus 2 Ag: NEGATIVE

## 2022-06-30 NOTE — Progress Notes (Signed)
Subjective:    Patient ID: Marcus Bullock, male    DOB: 04-04-1986, 36 y.o.   MRN: [id number]  HPI 36 year old male who  has a past medical history of Back pain, Depression, Left ankle sprain, Neck pain, Numbness and tingling, and Skin irritation.  He presents to the office today for headaches.  He does report a history of headaches but over the last few week he has been experiencing a different type of headache. Most recently he was walking to the mailbox, felt dizzy and had a pounding sensation on the left side of his head.  When he got back to the house he felt real dizzy as well. His headaches have felt more intense and had intermittent changes in vision with dizziness. Has had three of these headaches this week and 2-3 last week.   Additional symptoms include that of worsening fatigue past baseline and chest tightness ( chronic)   He does have aura and reports light and sound makes the headaches worse.    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    Social History   Socioeconomic History   Marital status: Single    Spouse name: Not on file   Number of children: 0   Years of education: college   Highest education level: Bachelor's degree (e.g., BA, AB, BS)  Occupational History   Occupation: 5th grade teacher  Tobacco Use   Smoking status: Never   Smokeless tobacco: Never  Vaping Use   Vaping Use: Never used  Substance and Sexual Activity   Alcohol use: Yes    Comment: Socially    Drug use: Not Currently   Sexual activity: Yes    Birth control/protection: None    Comment: male partner  Other Topics Concern   Not on file  Social History Narrative   Automotive engineer    Not married    No kids      Likes to workout and bowl.   Right-handed.   One cup tea  per day.   Lives alone.      Social Determinants of Health   Financial Resource Strain: Not on file  Food Insecurity: Not on file  Transportation Needs: Not on file  Physical Activity: Not on file  Stress: Not on file  Social Connections: Not on file  Intimate Partner Violence: Not on file    Past Surgical History:  Procedure Laterality Date   No past surgery      Family History  Problem Relation Age of Onset   Alcohol abuse Father    Ulcers Father    Heart disease Maternal Grandmother    Cancer Paternal Grandmother        breast   Thyroid disease Mother     Allergies  Allergen Reactions   Bee Venom Swelling    No current outpatient medications on file prior to visit.   No current facility-administered medications on file prior to visit.    BP 110/70   Pulse 92   Temp 98.9 F (37.2 C) (Oral)   Ht 5\' 11"  (1.803 m)   Wt 192 lb (87.1 kg)   SpO2 96%   BMI 26.78 kg/m  Objective:   Physical Exam Vitals reviewed.  Constitutional:      Appearance: Normal appearance.  Eyes:     Extraocular Movements: Extraocular movements intact.     Pupils: Pupils are equal, round, and reactive to light. Pupils are equal.     Comments: Discomfort with shining light in his eyes  Cardiovascular:     Rate and Rhythm: Normal rate and regular rhythm.     Pulses: Normal pulses.     Heart sounds: Normal heart sounds.  Pulmonary:     Effort: Pulmonary effort is normal.     Breath sounds: Normal breath sounds.  Musculoskeletal:        General: Normal range of motion.  Skin:    General: Skin is warm and dry.     Capillary Refill: Capillary refill takes less than 2 seconds.  Neurological:     General: No focal deficit present.     Mental Status: He is alert and oriented to person, place, and time.  Psychiatric:        Mood and Affect: Mood normal.        Behavior: Behavior normal.        Thought Content: Thought content normal.        Judgment: Judgment normal.        Assessment & Plan:  1. Acute intractable headache, unspecified headache type - It appears that he is experiencing migraine headaches. Sample of Nurtec given. Advised to try over the weekend, if not improved than can look at MRI  Samples of this drug were given to the patient, quantity 2, Lot Number [id number]  2. Other fatigue  - POC COVID-19- negative - POC Influenza A/B- negative   [name], NP  Time spent with patient today was 35 minutes which consisted of chart review, discussing migraine headaches and chronic fatigue work up, treatment answering questions and documentation.

## 2022-07-03 ENCOUNTER — Ambulatory Visit: Payer: 59

## 2022-07-03 DIAGNOSIS — G4733 Obstructive sleep apnea (adult) (pediatric): Secondary | ICD-10-CM

## 2022-07-03 DIAGNOSIS — R0683 Snoring: Secondary | ICD-10-CM

## 2022-07-04 DIAGNOSIS — R0683 Snoring: Secondary | ICD-10-CM | POA: Diagnosis not present

## 2022-07-17 ENCOUNTER — Encounter (HOSPITAL_BASED_OUTPATIENT_CLINIC_OR_DEPARTMENT_OTHER): Payer: Self-pay

## 2022-07-17 ENCOUNTER — Emergency Department (HOSPITAL_BASED_OUTPATIENT_CLINIC_OR_DEPARTMENT_OTHER): Payer: 59

## 2022-07-17 ENCOUNTER — Other Ambulatory Visit: Payer: Self-pay

## 2022-07-17 ENCOUNTER — Emergency Department (HOSPITAL_BASED_OUTPATIENT_CLINIC_OR_DEPARTMENT_OTHER)
Admission: EM | Admit: 2022-07-17 | Discharge: 2022-07-17 | Disposition: A | Discharge status: Home / Self Care | Payer: 59 | Attending: Emergency Medicine | Admitting: Emergency Medicine

## 2022-07-17 ENCOUNTER — Emergency Department (HOSPITAL_BASED_OUTPATIENT_CLINIC_OR_DEPARTMENT_OTHER): Payer: 59 | Admitting: Radiology

## 2022-07-17 DIAGNOSIS — R519 Headache, unspecified: Secondary | ICD-10-CM | POA: Diagnosis not present

## 2022-07-17 DIAGNOSIS — R0789 Other chest pain: Secondary | ICD-10-CM | POA: Insufficient documentation

## 2022-07-17 DIAGNOSIS — R5383 Other fatigue: Secondary | ICD-10-CM | POA: Insufficient documentation

## 2022-07-17 DIAGNOSIS — R42 Dizziness and giddiness: Secondary | ICD-10-CM | POA: Diagnosis not present

## 2022-07-17 DIAGNOSIS — R200 Anesthesia of skin: Secondary | ICD-10-CM | POA: Insufficient documentation

## 2022-07-17 LAB — BASIC METABOLIC PANEL
Anion gap: 8 (ref 5–15)
BUN: 9 mg/dL (ref 6–20)
CO2: 27 mmol/L (ref 22–32)
Calcium: 9.6 mg/dL (ref 8.9–10.3)
Chloride: 102 mmol/L (ref 98–111)
Creatinine, Ser: 1.02 mg/dL (ref 0.61–1.24)
GFR, Estimated: >60 mL/min (ref >60)
Glucose, Bld: 104 mg/dL — ABNORMAL HIGH (ref 70–99)
Potassium: 3.5 mmol/L (ref 3.5–5.1)
Sodium: 137 mmol/L (ref 135–145)

## 2022-07-17 LAB — CBC
HCT: 44.9 % (ref 39.0–52.0)
Hemoglobin: 14.9 g/dL (ref 13.0–17.0)
MCH: 29 pg (ref 26.0–34.0)
MCHC: 33.2 g/dL (ref 30.0–36.0)
MCV: 87.5 fL (ref 80.0–100.0)
Platelets: 259 10*3/uL (ref 150–400)
RBC: 5.13 MIL/uL (ref 4.22–5.81)
RDW: 11.5 % (ref 11.5–15.5)
WBC: 8.2 10*3/uL (ref 4.0–10.5)
nRBC: 0 % (ref 0.0–0.2)

## 2022-07-17 LAB — HEPATIC FUNCTION PANEL
ALT: 17 U/L (ref 0–44)
AST: 15 U/L (ref 15–41)
Albumin: 4.5 g/dL (ref 3.5–5.0)
Alkaline Phosphatase: 43 U/L (ref 38–126)
Bilirubin, Direct: 0.1 mg/dL (ref 0.0–0.2)
Indirect Bilirubin: 0.4 mg/dL (ref 0.3–0.9)
Total Bilirubin: 0.5 mg/dL (ref 0.3–1.2)
Total Protein: 7.7 g/dL (ref 6.5–8.1)

## 2022-07-17 LAB — TROPONIN I (HIGH SENSITIVITY)
Troponin I (High Sensitivity): 4 ng/L (ref <18)
Troponin I (High Sensitivity): 3 ng/L (ref <18)

## 2022-07-17 NOTE — ED Notes (Signed)
Pt ambulated to CT scanner w/o difficulty, steady gait noted, denies CP or SOB with ambulation.

## 2022-07-17 NOTE — ED Triage Notes (Signed)
Pt presents to the ED with chest tightness central/left side. States that it has been going on all day. Reports that the pain is more intense after he eats. States that the pain is a burning sensation. Pt also reports left arm numbness, HA, and dizziness. Pt states that he has had issues with this pain and arm numbness for about a year and he has been seeing his PCP for this. States that the dizziness and headache are new (within the last two weeks) pt states that he has also seen his PCP for this. Pt states that he was diagnosed with fibromyalgia. Pt states that the pain is more intense tonight.

## 2022-07-17 NOTE — Discharge Instructions (Signed)
Your evaluation today did not show any sign of any problems with your heart, liver, kidneys, brain.  Please continue to work with your physician team to find the source of your problems.  Return to the emergency department if you have any new or concerning symptoms.

## 2022-07-17 NOTE — ED Provider Notes (Addendum)
[facility] EMERGENCY DEPT Provider Note   CSN: [id number] Arrival date & time: 07/17/22  0044     History  Chief Complaint  Patient presents with   Chest Pain    Marcus Bullock is a 36 y.o. male.  The history is provided by the patient.  Chest Pain He has history of chronic back pain, fibromyalgia and comes in with multiple complaints.  He describes a tight feeling in his chest which tends to occur after eating.  There is sometimes some associated left arm numbness.  He has been noticing this for approximately the last 18 months, but does seem to be more intense recently.  There is no associated dyspnea or nausea or diaphoresis.  He had seen a cardiologist for this and had a stress test which was unremarkable.  He also complains of intermittent dizziness and headache.  He also has hives which break out with heat.  He has seen an allergist.  He has also seen a gastroenterologist for GERD and he does take omeprazole.  He is concerned that he may have a brain tumor because he has had this headache and also has noted some generalized fatigue.  He is a non-smoker and denies history of hypertension or diabetes or hyperlipidemia and denies family history of premature coronary atherosclerosis.   Home Medications Prior to Admission medications   Not on File      Allergies    Bee venom    Review of Systems   Review of Systems  Cardiovascular:  Positive for chest pain.  All other systems reviewed and are negative.   Physical Exam Updated Vital Signs BP 126/84 (BP Location: Right Arm)   Pulse 68   Temp 98.2 F (36.8 C) (Oral)   Resp 13   Ht 5\' 11"  (1.803 m)   Wt 87.1 kg   SpO2 100%   BMI 26.78 kg/m  Physical Exam Vitals and nursing note reviewed.  36 year old male, resting comfortably and in no acute distress. Vital signs are normal. Oxygen saturation is 99%, which is normal. Head is normocephalic and atraumatic. PERRLA, EOMI. Oropharynx is clear. Neck is  nontender and supple without adenopathy or JVD. Back is nontender and there is no CVA tenderness. Lungs are clear without rales, wheezes, or rhonchi. Chest is mildly tender in the left anterior rib cage without crepitus. Heart has regular rate and rhythm without murmur. Abdomen is soft, flat, with mild epigastric tenderness. Extremities have no cyanosis or edema, full range of motion is present. Skin is warm and dry without rash. Neurologic: Mental status is normal, cranial nerves are intact, moves all extremities equally.  ED Results / Procedures / Treatments   Labs (all labs ordered are listed, but only abnormal results are displayed) Labs Reviewed  BASIC METABOLIC PANEL - Abnormal; Notable for the following components:      Result Value   Glucose, Bld 104 (*)    All other components within normal limits  CBC  HEPATIC FUNCTION PANEL  TROPONIN I (HIGH SENSITIVITY)  TROPONIN I (HIGH SENSITIVITY)    EKG EKG Interpretation  Date/Time:  Monday July 17 2022 00:52:59 EDT Ventricular Rate:  99 PR Interval:  128 QRS Duration: 88 QT Interval:  328 QTC Calculation: 420 R Axis:   66 Text Interpretation: Normal sinus rhythm with sinus arrhythmia Early repolarization Normal ECG When compared with ECG of 28-Jan-2019 03:19, No significant change was found Confirmed by 30-Jan-2019 (Dione Booze) on 07/17/2022 1:06:51 AM  Radiology CT Head Wo  Contrast  Result Date: 07/17/2022 CLINICAL DATA:  Headache, chronic, new features or increased frequency. Left arm numbness, headache and dizziness. EXAM: CT HEAD WITHOUT CONTRAST TECHNIQUE: Contiguous axial images were obtained from the base of the skull through the vertex without intravenous contrast. RADIATION DOSE REDUCTION: This exam was performed according to the departmental dose-optimization program which includes automated exposure control, adjustment of the mA and/or kV according to patient size and/or use of iterative reconstruction technique.  COMPARISON:  None Available. FINDINGS: Brain: No acute infarct, hemorrhage, or mass lesion is present. No significant white matter lesions are present. Insert normal ventricle Deep brain nuclei are within normal limits. No significant extraaxial fluid collection is present. The brainstem and cerebellum are within normal limits. Vascular: No hyperdense vessel or unexpected calcification. Skull: Calvarium is intact. No focal lytic or blastic lesions are present. No significant extracranial soft tissue lesion is present. Sinuses/Orbits: The paranasal sinuses and mastoid air cells are clear. The globes and orbits are within normal limits. IMPRESSION: Negative CT of the head. Electronically Signed   By: Marin Roberts M.D.   On: 07/17/2022 06:57   DG Chest 2 View  Result Date: 07/17/2022 CLINICAL DATA:  Chest pain EXAM: CHEST - 2 VIEW COMPARISON:  05/04/2022 FINDINGS: The heart size and mediastinal contours are within normal limits. Both lungs are clear. The visualized skeletal structures are unremarkable. IMPRESSION: No active cardiopulmonary disease. Electronically Signed   By: Deatra Robinson M.D.   On: 07/17/2022 01:20    Procedures Procedures  Cardiac monitor shows normal sinus rhythm, per my interpretation.  Medications Ordered in ED Medications - No data to display  ED Course/ Medical Decision Making/ A&P                           Medical Decision Making Amount and/or Complexity of Data Reviewed Labs: ordered. Radiology: ordered.   Multiple somatic complaints.  Primary complaint today is chest discomfort but I have a low index of suspicion for ACS.  I have reviewed and interpreted his electrocardiogram, and my interpretation is early repolarization and otherwise normal electrocardiogram and unchanged from prior.  Heart score is 0, which puts him at low risk for major adverse cardiac events in the next 6 weeks.  Chest x-ray shows no active cardiopulmonary disease.  I have independently  viewed the images, and agree with the radiologist's interpretation.  I suspect that his chest pain is secondary to GERD, but multiple somatic complaints suggest some component of hypochondriasis as well.  He is very concerned that he may have a brain tumor, I have ordered a CT of the head.  I have reviewed and interpreted his laboratory tests, my interpretation is normal CBC, minimal elevation of random glucose on basic metabolic panel.  Normal initial troponin.  I have ordered a second troponin and a hepatic function panel.    Repeat troponin is unchanged.  CT of head looks grossly normal to me with radiologist interpretation pending.    Radiologist agrees CT of head is normal.  Hepatic function panel has returned and is also normal.  I explained all of these findings to the patient.  Cause for his symptoms is not clear, but no evidence of any serious pathology requiring immediate intervention.  I have recommended that he follow-up with the various members of his physician team.  Return to the emergency department for any new or concerning symptoms.  Final Clinical Impression(s) / ED Diagnoses Final diagnoses:  Atypical  chest pain  Other fatigue    Rx / DC Orders ED Discharge Orders     None         Delora Fuel, MD 49/75/30 0511    Delora Fuel, MD 11/13/15 3567    Delora Fuel, MD 01/41/03 [phone]

## 2022-07-18 ENCOUNTER — Telehealth: Payer: Self-pay | Admitting: Adult Health

## 2022-07-18 ENCOUNTER — Telehealth: Payer: Self-pay

## 2022-07-18 NOTE — Telephone Encounter (Signed)
Home sleep study done on July 03, 2022 showed no significant sleep apnea with AHI 3.5/hour and SPO2 low at 84%.  Average O2 saturation was 95%.  Please set up an office or virtual visit to discuss sleep study results in detail.

## 2022-07-18 NOTE — Telephone Encounter (Signed)
Called and spoke with patient, advised of results per Rexene Edison  NP.  He verbalized understanding.  Schedule virtual visit for 07/28/2022 at 10 am, advised to log in 15 minutes prior to visit.  Nothing further needed.

## 2022-07-18 NOTE — Telephone Encounter (Signed)
-----   Message from Donne Hazel sent at 07/05/2022  8:56 AM EDT ----- Hello this patient's HST report is ready for review

## 2022-07-18 NOTE — Telephone Encounter (Signed)
Transition Care Management Unsuccessful Follow-up Telephone Call  Date of discharge and from where:  07/17/22 from [facility] ED for "other chest pain"  Attempts:  1st Attempt  Reason for unsuccessful TCM follow-up call:  Left voice message  If pt calls back please schedule f/u with [facility].

## 2022-07-19 NOTE — Telephone Encounter (Signed)
Transition Care Management Unsuccessful Follow-up Telephone Call  Date of discharge and from where:  07/17/22 from [facility] ED for "other chest pain"  Attempts:  2nd Attempt  Reason for unsuccessful TCM follow-up call:  Left voice message  Please schedule appt with PCP when pt returns call.

## 2022-07-21 ENCOUNTER — Encounter: Payer: Self-pay | Admitting: Adult Health

## 2022-07-21 ENCOUNTER — Ambulatory Visit: Payer: 59 | Admitting: Adult Health

## 2022-07-21 VITALS — BP 140/80 | HR 85 | Temp 99.1°F | Ht 71.0 in | Wt 194.0 lb

## 2022-07-21 DIAGNOSIS — G894 Chronic pain syndrome: Secondary | ICD-10-CM

## 2022-07-21 DIAGNOSIS — M7918 Myalgia, other site: Secondary | ICD-10-CM

## 2022-07-21 NOTE — Progress Notes (Signed)
Subjective:    Patient ID: Marcus Bullock, male    DOB: 04-18-1986, 36 y.o.   MRN: [id number]  HPI 36 year old male who  has a past medical history of Back pain, Depression, Left ankle sprain, Neck pain, Numbness and tingling, and Skin irritation.  He presents to the office today for follow-up after being seen in the emergency room 4 days ago on 07/17/2022 for multiple complaints   He reported feeling a tight feeling in his chest which tend to occur after eating.  There was associated left arm numbness at times.  This has been ongoing for approximately last 18 months but did seem to be more intense recently.  He denied any associated dyspnea or nausea or diaphoresis.  He has been seen by cardiologist for this and had a stress test which was unremarkable.  He also complains of intermittent dizziness and headache.    He also complained of hives which break out with heat, he has seen an allergist.  He was concerned that he may have a brain tumor because he had some headaches and also noticed some generalized fatigue.  His work-up in the emergency room included an echocardiogram which showed early repolarization but otherwise normal and unchanged from prior.  His chest x-ray showed no active cardiopulmonary disease.  It was suspected that his chest pain is secondary to GERD but had multiple somatic complaints suggest some component of hypochondritis as well.   Is very concerned that he may have a brain tumor so a CT of the head was ordered.  CT of the head was normal, hepatic function panel returned as normal  Today he reports that he has restarted his Prilosec to help with his GERD symptoms.  Continues to have associated left-sided upper back pain with left-sided arm numbness at times.  He was seen by rheumatology recently at [facility] who prescribed him naltrexone, he has started taking this over the last week or so and believes that he has noticed some improvement in his overall mood, energy  level, and may be his chronic muscular pain.  He is also started to notice some improvement in his muscular pain with massage therapy.  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    Social History   Socioeconomic History   Marital status: Single    Spouse name: Not on file   Number of children: 0   Years of education: college   Highest education level: Bachelor's degree (e.g., BA, AB, BS)  Occupational History   Occupation: 5th grade teacher  Tobacco Use   Smoking status: Never   Smokeless tobacco: Never  Vaping Use   Vaping Use: Never used  Substance and Sexual Activity   Alcohol use: Yes    Comment: Socially    Drug use: Not Currently   Sexual activity: Yes    Birth control/protection: None    Comment: male partner  Other Topics Concern   Not on file  Social History Narrative   Automotive engineer    Not  married    No kids      Likes to workout and bowl.   Right-handed.   One cup tea per day.   Lives alone.      Social Determinants of Health   Financial Resource Strain: Not on file  Food Insecurity: Not on file  Transportation Needs: Not on file  Physical Activity: Not on file  Stress: Not on file  Social Connections: Not on file  Intimate Partner Violence: Not on file    Past Surgical History:  Procedure Laterality Date   No past surgery      Family History  Problem Relation Age of Onset   Alcohol abuse Father    Ulcers Father    Heart disease Maternal Grandmother    Cancer Paternal Grandmother        breast   Thyroid disease Mother     Allergies  Allergen Reactions   Bee Venom Swelling     Current Outpatient Medications on File Prior to Visit  Medication Sig Dispense Refill   famotidine (PEPCID) 20 MG tablet Take 20 mg by mouth daily.     fexofenadine (ALLEGRA) 180 MG tablet Take 180 mg by mouth daily.     levocetirizine (XYZAL) 5 MG tablet Take 5 mg by mouth at bedtime.     NALTREXONE HCL PO Take 1 mg by mouth daily.     No current facility-administered medications on file prior to visit.    BP (!) 140/80   Pulse 85   Temp 99.1 F (37.3 C) (Oral)   Ht 5\' 11"  (1.803 m)   Wt 194 lb (88 kg)   SpO2 98%   BMI 27.06 kg/m       Objective:   Physical Exam Vitals and nursing note reviewed.  Constitutional:      Appearance: Normal appearance.  Cardiovascular:     Rate and Rhythm: Normal rate and regular rhythm.     Pulses: Normal pulses.     Heart sounds: Normal heart sounds.  Pulmonary:     Effort: Pulmonary effort is normal.     Breath sounds: Normal breath sounds.  Musculoskeletal:        General: Tenderness present. Normal range of motion.       Arms:  Skin:    General: Skin is warm and dry.  Neurological:     General: No focal deficit present.     Mental Status: He is alert and oriented to person, place, and time.  Psychiatric:        Mood and Affect: Mood normal.        Behavior: Behavior normal.        Thought Content: Thought content normal.        Judgment: Judgment normal.       Assessment & Plan:  1. Chronic pain syndrome  - Ambulatory referral to Physical Therapy  2. Myofascial pain syndrome  - Ambulatory referral to Physical Therapy  , NP

## 2022-07-25 ENCOUNTER — Other Ambulatory Visit: Payer: Self-pay

## 2022-07-25 ENCOUNTER — Encounter: Payer: Self-pay | Admitting: Physical Therapy

## 2022-07-25 ENCOUNTER — Ambulatory Visit: Payer: 59 | Attending: Adult Health | Admitting: Physical Therapy

## 2022-07-25 DIAGNOSIS — M7918 Myalgia, other site: Secondary | ICD-10-CM | POA: Diagnosis not present

## 2022-07-25 DIAGNOSIS — R252 Cramp and spasm: Secondary | ICD-10-CM | POA: Diagnosis not present

## 2022-07-25 DIAGNOSIS — G894 Chronic pain syndrome: Secondary | ICD-10-CM | POA: Diagnosis not present

## 2022-07-25 DIAGNOSIS — M546 Pain in thoracic spine: Secondary | ICD-10-CM | POA: Diagnosis not present

## 2022-07-25 DIAGNOSIS — R293 Abnormal posture: Secondary | ICD-10-CM | POA: Diagnosis not present

## 2022-07-25 DIAGNOSIS — M542 Cervicalgia: Secondary | ICD-10-CM | POA: Diagnosis not present

## 2022-07-25 NOTE — Therapy (Signed)
OUTPATIENT PHYSICAL THERAPY CERVICAL EVALUATION   Patient Name: Marcus Bullock MRN: [id number] DOB:1986-03-25, 36 y.o., male Today's Date: 07/25/2022   PT End of Session - 07/25/22 0930     Visit Number 1    Date for PT Re-Evaluation 09/19/22    Authorization Type UHC, 20 VL through 10/01/22    Authorization - Number of Visits 20    PT Start Time 0805    PT Stop Time 0851    PT Time Calculation (min) 46 min    Activity Tolerance Patient tolerated treatment well    Behavior During Therapy [facility] for tasks assessed/performed             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   Past Surgical History:  Procedure Laterality Date   No past surgery     Patient Active Problem List   Diagnosis Date Noted   Snoring 05/04/2022   Chronic left shoulder pain 12/22/2020   B12 deficiency 11/29/2020   Chronic pain syndrome 10/27/2020   Thoracic spondylosis 10/27/2020   Muscle spasm 09/11/2019   Bilateral low back pain without sciatica 09/11/2019   Ankle impingement syndrome 08/11/2018   Breast skin changes 01/18/2017   Seasonal allergic rhinitis 01/18/2017   Acute low back pain 01/18/2017   Vitamin D deficiency 11/30/2009   Impacted cerumen 11/18/2009   Pityriasis versicolor 11/15/2009    PCP: Shirline Frees, NP   REFERRING PROVIDER: Shirline Frees, NP   REFERRING DIAG: G89.4 (ICD-10-CM) - Chronic pain syndrome M79.18 (ICD-10-CM) - Myofascial pain syndrome   THERAPY DIAG:  Abnormal posture  Cramp and spasm  Cervicalgia  Pain in thoracic spine  Rationale for Evaluation and Treatment Rehabilitation  ONSET DATE: 2 years  SUBJECTIVE:                                                                                                                                                                                                          SUBJECTIVE STATEMENT: I have been in pain for 2 years. May have been post-covid illness related but unsure.  I have been into the hospital twice now b/c I get numbness and tingling into Lt arm and worry I am having a heart attack.  For last two weeks I have had numbness in Lt 4th and 5th digits.  Worse when  I'm sitting on the couch watching TV or when reading on my phone.   I have Lt neck pain, pain inside Lt shoulder blade, headaches, fatigue.  I get chest pain but work up is negative.   Has done DN at [facility] in past and currently doing massage therapy 1x/mo which really helps.   Has foam roller and is doing stretches.  No strength training.  PERTINENT HISTORY:  Fibromyalgia, Back pain, Depression, Left ankle sprain, Neck pain, Numbness and tingling Lt UE, and skin irritation, GERD  PAIN:  PAIN:  Are you having pain? Yes NPRS scale: 6-7/10 Pain location: Lt neck, intrascapular, headaches daily Pain orientation: Left  PAIN TYPE: aching, burning, dull, and tingling/numbness Lt UE Pain description: constant  Aggravating factors: sitting on couch Relieving factors: Aleve, maybe the meds from rheumatology for fibromyalgia   PRECAUTIONS: None  WEIGHT BEARING RESTRICTIONS: No  FALLS:  Has patient fallen in last 6 months? 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 This is largest at T8-9, indenting the ventral subarachnoid space but not compressing the cord. No foraminal extension or encroachment seen at any level. Findings could possibly be associated with thoracic region back pain.    PATIENT SURVEYS:  FOTO 51%, goal 61%  COGNITION: Overall cognitive status: Within functional limits for tasks assessed  SENSATION: Pt reports full arm tingling/numbness on Lt intermittently, sometimes 4th and 5th digits only  POSTURE: rounded shoulders, forward head, and increased thoracic kyphosis, upper cervical extension, hinge crease at lower cervical spine  PALPATION: Tender with spasm: bil upper traps, lower cervcial multifidi Lt, intrascapular muscles and ribcage, bil thoracic paraspinals, Lt pectoral   CERVICAL ROM:   Active ROM A/PROM (deg) eval  Flexion 65, pulls into Lt shoulder  Extension 35, pain  Right lateral flexion 35  Left lateral flexion 50  Right rotation 55  Left rotation 55   (Blank rows = not tested)  UPPER EXTREMITY ROM:  Active ROM Right eval Left eval  Shoulder flexion  Intrascapular pain, full ROM  Shoulder extension    Shoulder abduction    Shoulder adduction    Shoulder extension    Shoulder internal rotation Functional IR limited, reaches T12 Functional IR limited, reaches T12  Shoulder external rotation    Elbow flexion    Elbow extension    Wrist flexion    Wrist extension    Wrist ulnar deviation    Wrist radial deviation    Wrist pronation    Wrist supination     (Blank rows = not tested)  UPPER EXTREMITY MMT: All UE strength 5/5 but Pt reports pain after testing  MMT Right eval Left eval  Shoulder flexion    Shoulder extension    Shoulder abduction    Shoulder adduction    Shoulder extension    Shoulder internal rotation    Shoulder external rotation    Middle trapezius  Lower trapezius    Elbow flexion    Elbow extension    Wrist flexion    Wrist  extension    Wrist ulnar deviation    Wrist radial deviation    Wrist pronation    Wrist supination    Grip strength 92 94   (Blank rows = not tested)  CERVICAL SPECIAL TESTS:  Upper limb tension test (ULTT): Positive Lt median nerve  Limited thoracic PA glides and ribcage mobility upper and mid thoracic   FUNCTIONAL TESTS:     TODAY'S TREATMENT:                                                                                                                      DATE:  07/25/22 Eval:  DN info Pain neuroscience education "fire alarm" analogy initiated HEP and practiced improved postural setting in supine and upright, needed heavy cueing and demo but able to achieve improved head and neck position  PATIENT EDUCATION:  Education details: Access Code: [email or web address], DN info, PNE discussion Person educated: Patient Education method: Explanation, Demonstration, Tactile cues, Verbal cues, and Handouts Education comprehension: verbalized understanding, returned demonstration, verbal cues required, tactile cues required, and needs further education  HOME EXERCISE PROGRAM: Access Code: [email or web address] URL: [email or web address] Date: 07/25/2022 Prepared by: Loistine Simas Jennice Renegar  Exercises - Supine Cervical Retraction with Towel  - 2 x daily - 7 x weekly - 2 sets - 10 reps - 2 breath cycles hold - Doorway Pec Stretch at 60 Elevation  - 3 x daily - 7 x weekly - 2 reps - 20-30 sec hold - Seated Passive Cervical Retraction  - 3 x daily - 7 x weekly - 1 sets - 10 reps - 10 hold  ASSESSMENT:  CLINICAL IMPRESSION: Patient is a 36 y.o. male who was seen today for physical therapy evaluation and treatment for ongoing chronic Lt sided neck pain, intrascapular pain, and Lt UE numbness/tingling which has been present for 2 years.  Pt also reports fatigue and limited activity tolerance.  He states this all may have started after having Covid but is unsure.  He has reported to ED over concerns of  symptoms wondering if related to heart or brain but all tests were negative.  To date, massage, DN and meds have helped somewhat but pain continues to be constant and is worse with looking down to read on phone and when seated on low couch watching TV.  Pt has signif forward head with upper cervical extension and rounded shoulder posture.  Median nerve ULTT is +.  He has ROM limited into bil rotation but improves by 15 deg once cued to rotate in good head/neck posture.  Neck flexion and extension are full but painful.  UE strength is 5/5 with pain on Lt after testing.  Pt has tenderness and spasm present in bil upper traps, Lt pec, Lt cervical multifidi of lower segments, rhomboids, levator scap, and bil thoracic multifidi.  Thoracic joint mobility is limited in thoracic ribcage and PA  glides upper and mid t-spine.  PNE and DN info provided and discussed today, along with postural re-ed and initial HEP.  Pt needs heavy TC, VC and demo to understand correct head and neck posture.  He will benefit from skilled intervention to address pain, posture and function to minimize pain with daily demands.   OBJECTIVE IMPAIRMENTS: decreased activity tolerance, decreased endurance, decreased ROM, hypomobility, increased muscle spasms, impaired flexibility, impaired UE functional use, improper body mechanics, postural dysfunction, and pain.   ACTIVITY LIMITATIONS: carrying, lifting, bending, sitting, sleeping, bathing, dressing, and reach over head  PARTICIPATION LIMITATIONS: meal prep, cleaning, laundry, driving, shopping, community activity, occupation, and yard work  PERSONAL FACTORS: Time since onset of injury/illness/exacerbation are also affecting patient's functional outcome.   REHAB POTENTIAL: Excellent  CLINICAL DECISION MAKING: Stable/uncomplicated  EVALUATION COMPLEXITY: Low   GOALS: Goals reviewed with patient? Yes  SHORT TERM GOALS: Target date: 08/22/2022   Pt will be ind with initial  HEP Baseline:  Goal status: INITIAL  2.  Pt will be able to demo proper upright posture without cueing Baseline:  Goal status: INITIAL  3.  Pt will report compliance with adjustments to posture and setting at home while reading and watching TV to reduce symptoms. Baseline:  Goal status: INITIAL  4.  Pt will report at least 25% less pain with daily tasks. Baseline:  Goal status: INITIAL    LONG TERM GOALS: Target date: 09/19/2022  Pt will be ind with advanced HEP and understand tools for symptom management. Baseline:  Goal status: INITIAL  2.  Pt will report at least 70% less pain with daily tasks. Baseline:  Goal status: INITIAL  3.  Pt will improve FOTO score to at least 61% to demo improved function Baseline: 51% Goal status: INITIAL  4.  Pt will report improved energy and endurance by at least 50% on days in the community for work. Baseline:  Goal status: INITIAL     PLAN:  PT FREQUENCY: 1x/week  PT DURATION: 8 weeks  PLANNED INTERVENTIONS: Therapeutic exercises, Therapeutic activity, Neuromuscular re-education, Patient/Family education, Self Care, Joint mobilization, Joint manipulation, Dry Needling, Electrical stimulation, Spinal manipulation, Spinal mobilization, Cryotherapy, Moist heat, Taping, Traction, and Manual therapy  PLAN FOR NEXT SESSION: review HEP, begin DN to cervical multifidi, bil upper traps, Lt intrascapular, pecs, review what Pt is doing at home with foam roller and revise at needed, add open book, median nerve glides [employer], PT 07/25/22 9:31 AM

## 2022-07-25 NOTE — Patient Instructions (Signed)

## 2022-07-28 ENCOUNTER — Encounter: Payer: 59 | Admitting: Adult Health

## 2022-07-28 ENCOUNTER — Telehealth: Payer: Self-pay

## 2022-07-31 ENCOUNTER — Encounter: Payer: Self-pay | Admitting: Rehabilitative and Restorative Service Providers"

## 2022-07-31 ENCOUNTER — Ambulatory Visit: Payer: 59 | Admitting: Rehabilitative and Restorative Service Providers"

## 2022-07-31 DIAGNOSIS — R293 Abnormal posture: Secondary | ICD-10-CM

## 2022-07-31 DIAGNOSIS — M542 Cervicalgia: Secondary | ICD-10-CM

## 2022-07-31 DIAGNOSIS — G894 Chronic pain syndrome: Secondary | ICD-10-CM | POA: Diagnosis not present

## 2022-07-31 DIAGNOSIS — M546 Pain in thoracic spine: Secondary | ICD-10-CM

## 2022-07-31 DIAGNOSIS — R252 Cramp and spasm: Secondary | ICD-10-CM

## 2022-07-31 NOTE — Telephone Encounter (Signed)
Done

## 2022-07-31 NOTE — Therapy (Signed)
OUTPATIENT PHYSICAL THERAPY CERVICAL EVALUATION   Patient Name: CONSTANCIO DOBKIN MRN: [id number] DOB:[birth year], 36 y.o., male Today's Date: 07/31/2022   PT End of Session - 07/31/22 0901     Visit Number 2    Date for PT Re-Evaluation 09/19/22    Authorization Type UHC, 20 VL through 10/01/22    Authorization - Visit Number 2    Authorization - Number of Visits 20    PT Start Time [id number]   Pt arrived late for PT session   PT Stop Time 0925    PT Time Calculation (min) 26 min    Activity Tolerance Patient tolerated treatment well    Behavior During Therapy [facility] for tasks assessed/performed             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   Past Surgical History:  Procedure Laterality Date   No past surgery     Patient Active Problem List   Diagnosis Date Noted   Snoring 05/04/2022   Chronic left shoulder pain 12/22/2020   B12 deficiency 11/29/2020   Chronic pain syndrome 10/27/2020   Thoracic spondylosis 10/27/2020   Muscle spasm 09/11/2019   Bilateral low back pain without sciatica 09/11/2019   Ankle impingement syndrome 08/11/2018   Breast skin changes 01/18/2017   Seasonal allergic rhinitis 01/18/2017   Acute low back pain 01/18/2017   Vitamin D deficiency 11/30/2009   Impacted cerumen 11/18/2009   Pityriasis versicolor 11/15/2009    PCP: [name], NP   REFERRING PROVIDER: [name], NP   REFERRING DIAG: G89.4 (ICD-10-CM) - Chronic pain syndrome M79.18 (ICD-10-CM) - Myofascial pain syndrome   THERAPY DIAG:  Abnormal posture  Cramp and spasm  Cervicalgia  Pain in thoracic spine  Rationale for Evaluation and Treatment Rehabilitation  ONSET DATE: 2 years  SUBJECTIVE:                                                                                                                                                                                                          SUBJECTIVE STATEMENT: Pt reports that he went for a walk in a park over the weekend and noted some increased pain in his shoulder  PERTINENT HISTORY:  Fibromyalgia, Back pain, Depression, Left ankle sprain, Neck pain, Numbness and tingling Lt UE, and skin irritation, GERD  PAIN:  PAIN:  Are you having pain? Yes NPRS scale: 4.5/10 Pain location: Lt neck, intrascapular, headaches daily Pain orientation: Left  PAIN TYPE: aching, burning, dull, and tingling/numbness Lt UE Pain description: constant  Aggravating factors: sitting on couch Relieving factors: Aleve, maybe the meds from rheumatology for fibromyalgia   PRECAUTIONS: None  WEIGHT BEARING RESTRICTIONS: No  FALLS:  Has patient fallen in last 6 months? 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 This is largest at T8-9, indenting the ventral subarachnoid space but not compressing the cord. No foraminal extension or encroachment seen at any level. Findings could possibly be associated with thoracic region back pain.    PATIENT SURVEYS:  FOTO 51%, goal 61%  COGNITION: Overall cognitive status: Within functional limits for tasks assessed  SENSATION: Pt reports full arm tingling/numbness on Lt  intermittently, sometimes 4th and 5th digits only  POSTURE: rounded shoulders, forward head, and increased thoracic kyphosis, upper cervical extension, hinge crease at lower cervical spine  PALPATION: Tender with spasm: bil upper traps, lower cervcial multifidi Lt, intrascapular muscles and ribcage, bil thoracic paraspinals, Lt pectoral   CERVICAL ROM:   Active ROM A/PROM (deg) eval  Flexion 65, pulls into Lt shoulder  Extension 35, pain  Right lateral flexion 35  Left lateral flexion 50  Right rotation 55  Left rotation 55   (Blank rows = not tested)  UPPER EXTREMITY ROM:  Active ROM Right eval Left eval  Shoulder flexion  Intrascapular pain, full ROM  Shoulder extension    Shoulder abduction    Shoulder adduction    Shoulder extension    Shoulder internal rotation Functional IR limited, reaches T12 Functional IR limited, reaches T12  Shoulder external rotation    Elbow flexion    Elbow extension    Wrist flexion    Wrist extension    Wrist ulnar deviation    Wrist radial deviation    Wrist pronation    Wrist supination     (Blank rows = not tested)  UPPER EXTREMITY MMT: Eval: All UE strength 5/5 but Pt reports pain after testing Grip strength: Right 92 lbs, Left 94 lbs   CERVICAL SPECIAL TESTS:  Upper limb tension test (ULTT): Positive Lt median nerve  Limited thoracic PA glides and ribcage mobility upper and mid thoracic      TODAY'S TREATMENT:  DATE 07/31/2022: UBE level 1.0 x2 min each direction with PT present to discuss status Seated chin into purple ball 2x10 (with cuing for technique and positioning) Seated upper trap and levator stretch 2x20 sec bilat each Standing in front of mirror median nerve glide x10 bilat Sidelying open book stretch x10 bilat Trigger Point Dry-Needling  Treatment instructions: Expect mild to moderate muscle  soreness. S/S of pneumothorax if dry needled over a lung field, and to seek immediate medical attention should they occur. Patient verbalized understanding of these instructions and education. Patient Consent Given: Yes Education handout provided: Previously provided Muscles treated: left suboccipital, left cervical and thoracic multifidi, left upper trap, left rhomboids Electrical stimulation performed: No Parameters: N/A Treatment response/outcome: Utilized skilled palpation to locate and identify trigger points.  Able to illicit twitch response and muscle elongation following.    DATE: 07/25/22 Eval:  DN info Pain neuroscience education "fire alarm" analogy initiated HEP and practiced improved postural setting in supine and upright, needed heavy cueing and demo but able to achieve improved head and neck position  PATIENT EDUCATION:  Education details: Access Code: [email or web address], DN info, PNE discussion Person educated: Patient Education method: Explanation, Demonstration, Tactile cues, Verbal cues, and Handouts Education comprehension: verbalized understanding, returned demonstration, verbal cues required, tactile cues required, and needs further education  HOME EXERCISE PROGRAM: Access Code: [email or web address] URL: [email or web address] Date: 07/31/2022 Prepared by: Shelby Dubin Emmett Arntz  Exercises - Supine Cervical Retraction with Towel  - 2 x daily - 7 x weekly - 2 sets - 10 reps - 2 breath cycles hold - Doorway Pec Stretch at 60 Elevation  - 3 x daily - 7 x weekly - 2 reps - 20-30 sec hold - Seated Passive Cervical Retraction  - 3 x daily - 7 x weekly - 1 sets - 10 reps - 10 hold - Standing Median Nerve Glide  - 1 x daily - 7 x weekly - 2 sets - 10 reps - Sidelying Thoracic Rotation with Open Book  - 1 x daily - 7 x weekly - 1 sets - 10 reps  ASSESSMENT:  CLINICAL IMPRESSION: Mr Rodelo presents to skilled PT reporting compliance with HEP and some decreased in pain since initial  evaluation, but that he continues to have pain down left upper trap.  Pt agreeable to dry needling, as he has had some success with it in the past.  Patient able to palpate strong twitch response with dry needling and reporting decreased spasms following.  Pt able to preform new exercises without reports of increased pain.  OBJECTIVE IMPAIRMENTS: decreased activity tolerance, decreased endurance, decreased ROM, hypomobility, increased muscle spasms, impaired flexibility, impaired UE functional use, improper body mechanics, postural dysfunction, and pain.   ACTIVITY LIMITATIONS: carrying, lifting, bending, sitting, sleeping, bathing, dressing, and reach over head  PARTICIPATION LIMITATIONS: meal prep, cleaning, laundry, driving, shopping, community activity, occupation, and yard work  PERSONAL FACTORS: Time since onset of injury/illness/exacerbation are also affecting patient's functional outcome.   REHAB POTENTIAL: Excellent  CLINICAL DECISION MAKING: Stable/uncomplicated  EVALUATION COMPLEXITY: Low   GOALS: Goals reviewed with patient? Yes  SHORT TERM GOALS: Target date: 08/22/2022   Pt will be ind with initial HEP Baseline:  Goal status: IN PROGRESS  2.  Pt will be able to demo proper upright posture without cueing Baseline:  Goal status: INITIAL  3.  Pt will report compliance with adjustments to posture and setting at home while reading and watching TV to reduce symptoms. Baseline:  Goal status: INITIAL  4.  Pt will report at least 25% less pain with daily tasks. Baseline:  Goal status: INITIAL    LONG TERM GOALS: Target date: 09/19/2022  Pt will be ind with advanced HEP and understand tools for symptom management. Baseline:  Goal status: INITIAL  2.  Pt will report at least 70% less pain with daily tasks. Baseline:  Goal status: INITIAL  3.  Pt will improve FOTO score to at least 61% to demo improved function Baseline: 51% Goal status: INITIAL  4.  Pt will  report improved energy and endurance by at least 50% on days in the community for work. Baseline:  Goal status: INITIAL     PLAN:  PT FREQUENCY: 1x/week  PT DURATION: 8 weeks  PLANNED INTERVENTIONS: Therapeutic exercises, Therapeutic activity, Neuromuscular re-education, Patient/Family education, Self Care, Joint mobilization, Joint manipulation, Dry Needling, Electrical stimulation, Spinal manipulation, Spinal mobilization, Cryotherapy, Moist heat, Taping, Traction, and Manual therapy  PLAN FOR NEXT SESSION: review HEP, Manual therapy/DN as needed, review what Pt is doing at home with foam roller and revise at needed, assess and progress HEP as indicated   Juel Burrow, PT 07/31/22 9:47 AM  [facility] [address], [place], [place] [address] Phone # [phone] Fax [phone]

## 2022-08-11 ENCOUNTER — Ambulatory Visit: Payer: 59 | Attending: Adult Health | Admitting: Rehabilitative and Restorative Service Providers"

## 2022-08-11 ENCOUNTER — Encounter: Payer: Self-pay | Admitting: Rehabilitative and Restorative Service Providers"

## 2022-08-11 DIAGNOSIS — M542 Cervicalgia: Secondary | ICD-10-CM

## 2022-08-11 DIAGNOSIS — R252 Cramp and spasm: Secondary | ICD-10-CM | POA: Diagnosis present

## 2022-08-11 DIAGNOSIS — R293 Abnormal posture: Secondary | ICD-10-CM

## 2022-08-11 DIAGNOSIS — M546 Pain in thoracic spine: Secondary | ICD-10-CM | POA: Diagnosis present

## 2022-08-11 NOTE — Therapy (Addendum)
OUTPATIENT PHYSICAL THERAPY TREATMENT NOTE AND LATE ENTRY DISCHARGE SUMMARY   Patient Name: Marcus Bullock MRN: [id number] DOB:1985-10-16, 36 y.o., male Today's Date: 08/11/2022   PT End of Session - 08/11/22 0900     Visit Number 3    Date for PT Re-Evaluation 09/19/22    Authorization Type [employer], 20 VL through 10/01/22    Authorization - Visit Number 3    Authorization - Number of Visits 20    PT Start Time 4132   Pt arrived late for appointment   PT Stop Time 0925    PT Time Calculation (min) 27 min    Activity Tolerance Patient tolerated treatment well    Behavior During Therapy [facility] for tasks assessed/performed             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   Past Surgical History:  Procedure Laterality Date   No past surgery     Patient Active Problem List   Diagnosis Date Noted   Snoring 05/04/2022   Chronic left shoulder pain 12/22/2020   B12 deficiency 11/29/2020   Chronic pain syndrome 10/27/2020   Thoracic spondylosis 10/27/2020   Muscle spasm 09/11/2019   Bilateral low back pain without sciatica 09/11/2019   Ankle impingement syndrome 08/11/2018   Breast skin changes 01/18/2017   Seasonal allergic rhinitis 01/18/2017   Acute low back pain 01/18/2017   Vitamin D deficiency 11/30/2009   Impacted cerumen 11/18/2009   Pityriasis versicolor 11/15/2009    PCP: [name], NP   REFERRING PROVIDER: [name], NP   REFERRING DIAG: G89.4 (ICD-10-CM) - Chronic pain syndrome M79.18 (ICD-10-CM) - Myofascial pain syndrome   THERAPY DIAG:  Abnormal posture  Cramp and spasm  Cervicalgia  Pain in thoracic spine  Rationale for Evaluation and Treatment Rehabilitation  ONSET DATE: 2 years  SUBJECTIVE:                                                                                                                                                                                                          SUBJECTIVE STATEMENT: Pt reports that he went for a walk in a park over the weekend and noted some increased pain in his shoulder  PERTINENT HISTORY:  Fibromyalgia, Back pain, Depression, Left ankle sprain, Neck pain, Numbness and tingling Lt UE, and  skin irritation, GERD  PAIN:  PAIN:  Are you having pain? Yes NPRS scale: 4/10 Pain location: Lt neck, intrascapular, headaches daily Pain orientation: Left  PAIN TYPE: aching, burning, dull, and tingling/numbness Lt UE Pain description: constant  Aggravating factors: sitting on couch Relieving factors: Aleve, maybe the meds from rheumatology for fibromyalgia   PRECAUTIONS: None  WEIGHT BEARING RESTRICTIONS: No  FALLS:  Has patient fallen in last 6 months? 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 This is largest at T8-9, indenting the ventral subarachnoid space but not compressing the cord. No foraminal extension or encroachment seen at any level. Findings could possibly be associated with thoracic region back pain.    PATIENT SURVEYS:  FOTO 51%, goal 61%  COGNITION: Overall cognitive status: Within functional limits for tasks assessed  SENSATION: Pt reports full arm  tingling/numbness on Lt intermittently, sometimes 4th and 5th digits only  POSTURE: rounded shoulders, forward head, and increased thoracic kyphosis, upper cervical extension, hinge crease at lower cervical spine  PALPATION: Tender with spasm: bil upper traps, lower cervcial multifidi Lt, intrascapular muscles and ribcage, bil thoracic paraspinals, Lt pectoral   CERVICAL ROM:   Active ROM A/PROM (deg) eval A/ROM 08/11/22  Flexion 65, pulls into Lt shoulder 65  Extension 35, pain 35  Right lateral flexion 35 55  Left lateral flexion 50 45  Right rotation 55 65  Left rotation 55 65   (Blank rows = not tested)  UPPER EXTREMITY ROM:  Active ROM Right eval Left eval  Shoulder flexion  Intrascapular pain, full ROM  Shoulder extension    Shoulder abduction    Shoulder adduction    Shoulder extension    Shoulder internal rotation Functional IR limited, reaches T12 Functional IR limited, reaches T12  Shoulder external rotation    Elbow flexion    Elbow extension    Wrist flexion    Wrist extension    Wrist ulnar deviation    Wrist radial deviation    Wrist pronation    Wrist supination     (Blank rows = not tested)  UPPER EXTREMITY MMT: Eval: All UE strength 5/5 but Pt reports pain after testing Grip strength: Right 92 lbs, Left 94 lbs   CERVICAL SPECIAL TESTS:  Upper limb tension test (ULTT): Positive Lt median nerve  Limited thoracic PA glides and ribcage mobility upper and mid thoracic      TODAY'S TREATMENT:  DATE 08/11/2022: UBE level 1.3 x2 min each direction with PT present to discuss status Chin tuck 2x10 Seated shoulder ER and horizontal abduction with green tband 2x10 Prone shoulder rows and shoulder extension with 3# 2x10 bilat Trigger Point Dry-Needling  Treatment instructions: Expect mild to moderate muscle soreness. S/S of pneumothorax  if dry needled over a lung field, and to seek immediate medical attention should they occur. Patient verbalized understanding of these instructions and education. Patient Consent Given: Yes Education handout provided: Previously provided Muscles treated: bilateral upper traps Electrical stimulation performed: No Parameters: N/A Treatment response/outcome: Utilized skilled palpation to locate and identify trigger points.  Able to illicit twitch response and muscle elongation following.    DATE 07/31/2022: UBE level 1.0 x2 min each direction with PT present to discuss status Seated chin into purple ball 2x10 (with cuing for technique and positioning) Seated upper trap and levator stretch 2x20 sec bilat each Standing in front of mirror median nerve glide x10 bilat Sidelying open book stretch x10 bilat Trigger Point Dry-Needling  Treatment instructions: Expect mild to moderate muscle soreness. S/S of pneumothorax if dry needled over a lung field, and to seek immediate medical attention should they occur. Patient verbalized understanding of these instructions and education. Patient Consent Given: Yes Education handout provided: Previously provided Muscles treated: left suboccipital, left cervical and thoracic multifidi, left upper trap, left rhomboids Electrical stimulation performed: No Parameters: N/A Treatment response/outcome: Utilized skilled palpation to locate and identify trigger points.  Able to illicit twitch response and muscle elongation following.    DATE: 07/25/22 Eval:  DN info Pain neuroscience education "fire alarm" analogy initiated HEP and practiced improved postural setting in supine and upright, needed heavy cueing and demo but able to achieve improved head and neck position  PATIENT EDUCATION:  Education details: Access Code: [email or web address], DN info, PNE discussion Person educated: Patient Education method: Explanation, Demonstration, Tactile cues, Verbal cues, and  Handouts Education comprehension: verbalized understanding, returned demonstration, verbal cues required, tactile cues required, and needs further education  HOME EXERCISE PROGRAM: Access Code: [email or web address] URL: [email or web address] Date: 08/11/2022 Prepared by: Shelby Dubin Gunter Conde  Exercises - Supine Cervical Retraction with Towel  - 2 x daily - 7 x weekly - 2 sets - 10 reps - 2 breath cycles hold - Doorway Pec Stretch at 60 Elevation  - 3 x daily - 7 x weekly - 2 reps - 20-30 sec hold - Seated Passive Cervical Retraction  - 3 x daily - 7 x weekly - 1 sets - 10 reps - 10 hold - Standing Median Nerve Glide  - 1 x daily - 7 x weekly - 2 sets - 10 reps - Sidelying Thoracic Rotation with Open Book  - 1 x daily - 7 x weekly - 1 sets - 10 reps - Shoulder External Rotation and Scapular Retraction with Resistance  - 1 x daily - 7 x weekly - 2 sets - 10 reps - Standing Shoulder Horizontal Abduction with Resistance  - 1 x daily - 7 x weekly - 2 sets - 10 reps - Prone Shoulder Row  - 1 x daily - 7 x weekly - 2 sets - 10 reps - Prone Shoulder Extension - Single Arm  - 1 x daily - 7 x weekly - 2 sets - 10 reps  ASSESSMENT:  CLINICAL IMPRESSION: Mr Liaw presents to skilled PT reporting compliance with HEP stating that the nerve stretch has helped a lot. Pt requires some cuing for improved posture during PT session.  Pt is progressing with increased strength and with good twitch response noted following dry needling and pt reported feeling looser.  Pt continues to require skilled PT to progress towards goal related goals.  OBJECTIVE IMPAIRMENTS: decreased activity tolerance, decreased endurance, decreased ROM, hypomobility, increased muscle spasms, impaired flexibility, impaired UE functional use, improper body mechanics, postural dysfunction, and pain.   ACTIVITY LIMITATIONS: carrying, lifting, bending, sitting, sleeping, bathing, dressing, and reach over head  PARTICIPATION LIMITATIONS:  meal prep, cleaning, laundry, driving, shopping, community activity, occupation, and yard work  PERSONAL FACTORS: Time since onset of injury/illness/exacerbation are also affecting patient's functional outcome.   REHAB POTENTIAL: Excellent  CLINICAL DECISION MAKING: Stable/uncomplicated  EVALUATION COMPLEXITY: Low   GOALS: Goals reviewed with patient? Yes  SHORT TERM GOALS: Target date: 08/22/2022   Pt will be ind with initial HEP Baseline:  Goal status: MET  2.  Pt will be able to demo proper upright posture without cueing Baseline:  Goal status: IN PROGRESS  3.  Pt will report compliance with adjustments to posture and setting at home while reading and watching TV to reduce symptoms. Baseline:  Goal status: INITIAL  4.  Pt will report at least 25% less pain with daily tasks. Baseline:  Goal status: INITIAL    LONG TERM GOALS: Target date: 09/19/2022  Pt will be ind with advanced HEP and understand tools for symptom management. Baseline:  Goal status: INITIAL  2.  Pt will report at least 70% less pain with daily tasks. Baseline:  Goal status: INITIAL  3.  Pt will improve FOTO score to at least 61% to demo improved function Baseline: 51% Goal status: INITIAL  4.  Pt will report improved energy and endurance by at least 50% on days in the community for work. Baseline:  Goal status: INITIAL     PLAN:  PT FREQUENCY: 1x/week  PT DURATION: 8 weeks  PLANNED INTERVENTIONS: Therapeutic exercises, Therapeutic activity, Neuromuscular re-education, Patient/Family education, Self Care, Joint mobilization, Joint manipulation, Dry Needling, Electrical stimulation, Spinal manipulation, Spinal mobilization, Cryotherapy, Moist heat, Taping, Traction, and Manual therapy  PLAN FOR NEXT SESSION: review HEP, Manual therapy/DN as needed, review what Pt is doing at home with foam roller and revise at needed, assess and progress HEP as indicated   PHYSICAL THERAPY DISCHARGE  SUMMARY  As of 09/08/2022, pt has not returned for any further follow up appointments with 3 "no shows" for appointments.  Per attendance policy, patient discharged from PT services at this time.  Patient agrees to discharge. Patient goals were not met. Patient is being discharged due to not returning since the last visit.     Juel Burrow, PT 08/11/22 9:39 AM  [facility] [address], [place], [place] [address] Phone # [phone] Fax [phone]

## 2022-08-22 ENCOUNTER — Ambulatory Visit: Payer: 59 | Admitting: Physical Therapy

## 2022-08-22 ENCOUNTER — Telehealth: Payer: Self-pay | Admitting: Physical Therapy

## 2022-08-22 NOTE — Telephone Encounter (Signed)
Pt was a no show for 8am physical therapy appointment on 08/22/22.  PT left a VM asking for a call back so we can confirm next appointment.  Masyn Rostro, PT 08/22/22 8:24 AM

## 2022-08-22 NOTE — Therapy (Incomplete)
OUTPATIENT PHYSICAL THERAPY TREATMENT NOTE   Patient Name: Marcus Bullock MRN: [id number] DOB:04/14/1986, 36 y.o., male Today's Date: 08/22/2022     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   Past Surgical History:  Procedure Laterality Date   No past surgery     Patient Active Problem List   Diagnosis Date Noted   Snoring 05/04/2022   Chronic left shoulder pain 12/22/2020   B12 deficiency 11/29/2020   Chronic pain syndrome 10/27/2020   Thoracic spondylosis 10/27/2020   Muscle spasm 09/11/2019   Bilateral low back pain without sciatica 09/11/2019   Ankle impingement syndrome 08/11/2018   Breast skin changes 01/18/2017   Seasonal allergic rhinitis 01/18/2017   Acute low back pain 01/18/2017   Vitamin D deficiency 11/30/2009   Impacted cerumen 11/18/2009   Pityriasis versicolor 11/15/2009    PCP: [name], NP   REFERRING PROVIDER: [name], NP   REFERRING DIAG: G89.4 (ICD-10-CM) - Chronic pain syndrome M79.18 (ICD-10-CM) - Myofascial pain syndrome   THERAPY DIAG:  No diagnosis found.  Rationale for Evaluation and Treatment Rehabilitation  ONSET DATE: 2 years  SUBJECTIVE:                                                                                                                                                                                                         SUBJECTIVE STATEMENT:   PERTINENT HISTORY:  Fibromyalgia, Back pain, Depression, Left ankle sprain, Neck pain, Numbness and tingling Lt UE, and skin irritation, GERD  PAIN:  PAIN:  Are you having pain? Yes NPRS scale: 4/10 Pain location: Lt neck, intrascapular, headaches daily Pain orientation: Left  PAIN TYPE: aching, burning, dull, and tingling/numbness Lt UE Pain description: constant  Aggravating factors:  sitting on couch Relieving factors: Aleve, maybe the meds from rheumatology for fibromyalgia   PRECAUTIONS: None  WEIGHT BEARING RESTRICTIONS: No  FALLS:  Has patient fallen in last 6 months? 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 This is largest at T8-9, indenting the ventral subarachnoid space but not compressing the cord. No foraminal extension or encroachment seen at any level. Findings could possibly be associated with thoracic region back pain.    PATIENT SURVEYS:  FOTO 51%, goal 61%  COGNITION: Overall cognitive status: Within functional limits for tasks assessed  SENSATION: Pt reports full arm tingling/numbness on Lt intermittently, sometimes 4th and 5th digits only  POSTURE: rounded shoulders, forward head, and increased thoracic kyphosis, upper cervical extension, hinge crease at lower cervical spine  PALPATION: Tender with spasm: bil upper traps, lower cervcial multifidi Lt, intrascapular muscles and ribcage, bil thoracic paraspinals, Lt pectoral   CERVICAL ROM:   Active ROM A/PROM (deg) eval A/ROM 08/11/22  Flexion 65, pulls into Lt shoulder 65  Extension 35, pain 35  Right lateral flexion 35 55  Left lateral flexion 50 45  Right rotation 55 65  Left rotation 55 65   (Blank rows = not tested)  UPPER EXTREMITY ROM:  Active  ROM Right eval Left eval  Shoulder flexion  Intrascapular pain, full ROM  Shoulder extension    Shoulder abduction    Shoulder adduction    Shoulder extension    Shoulder internal rotation Functional IR limited, reaches T12 Functional IR limited, reaches T12  Shoulder external rotation    Elbow flexion    Elbow extension    Wrist flexion    Wrist extension    Wrist ulnar deviation    Wrist radial deviation    Wrist pronation    Wrist supination     (Blank rows = not tested)  UPPER EXTREMITY MMT: Eval: All UE strength 5/5 but Pt reports pain after testing Grip strength: Right 92 lbs, Left 94 lbs   CERVICAL SPECIAL TESTS:  Upper limb tension test (ULTT): Positive Lt median nerve  Limited thoracic PA glides and ribcage mobility upper and mid thoracic      TODAY'S TREATMENT:                                                                                                                       Date: 08/22/22   DATE 08/11/2022: UBE level 1.3 x2 min each direction with PT present to discuss status Chin tuck 2x10 Seated shoulder ER and horizontal abduction with green tband 2x10 Prone shoulder rows and shoulder extension with 3# 2x10 bilat Trigger Point Dry-Needling  Treatment instructions: Expect mild to moderate muscle soreness. S/S of pneumothorax if dry needled over a lung field, and to seek immediate medical attention should they occur. Patient verbalized understanding of these instructions and education. Patient Consent Given: Yes Education handout provided: Previously provided Muscles treated: bilateral upper traps Electrical stimulation performed: No Parameters: N/A Treatment response/outcome: Utilized skilled palpation to locate and  identify trigger points.  Able to illicit twitch response and muscle elongation following.    DATE 07/31/2022: UBE level 1.0 x2 min each direction with PT present to discuss status Seated chin into purple ball 2x10 (with cuing for  technique and positioning) Seated upper trap and levator stretch 2x20 sec bilat each Standing in front of mirror median nerve glide x10 bilat Sidelying open book stretch x10 bilat Trigger Point Dry-Needling  Treatment instructions: Expect mild to moderate muscle soreness. S/S of pneumothorax if dry needled over a lung field, and to seek immediate medical attention should they occur. Patient verbalized understanding of these instructions and education. Patient Consent Given: Yes Education handout provided: Previously provided Muscles treated: left suboccipital, left cervical and thoracic multifidi, left upper trap, left rhomboids Electrical stimulation performed: No Parameters: N/A Treatment response/outcome: Utilized skilled palpation to locate and identify trigger points.  Able to illicit twitch response and muscle elongation following.    DATE: 07/25/22 Eval:  DN info Pain neuroscience education "fire alarm" analogy initiated HEP and practiced improved postural setting in supine and upright, needed heavy cueing and demo but able to achieve improved head and neck position  PATIENT EDUCATION:  Education details: Access Code: [email or web address], DN info, PNE discussion Person educated: Patient Education method: Explanation, Demonstration, Tactile cues, Verbal cues, and Handouts Education comprehension: verbalized understanding, returned demonstration, verbal cues required, tactile cues required, and needs further education  HOME EXERCISE PROGRAM: Access Code: [email or web address] URL: [email or web address] Date: 08/11/2022 Prepared by: Shelby Dubin Menke  Exercises - Supine Cervical Retraction with Towel  - 2 x daily - 7 x weekly - 2 sets - 10 reps - 2 breath cycles hold - Doorway Pec Stretch at 60 Elevation  - 3 x daily - 7 x weekly - 2 reps - 20-30 sec hold - Seated Passive Cervical Retraction  - 3 x daily - 7 x weekly - 1 sets - 10 reps - 10 hold - Standing Median Nerve Glide  - 1 x daily - 7  x weekly - 2 sets - 10 reps - Sidelying Thoracic Rotation with Open Book  - 1 x daily - 7 x weekly - 1 sets - 10 reps - Shoulder External Rotation and Scapular Retraction with Resistance  - 1 x daily - 7 x weekly - 2 sets - 10 reps - Standing Shoulder Horizontal Abduction with Resistance  - 1 x daily - 7 x weekly - 2 sets - 10 reps - Prone Shoulder Row  - 1 x daily - 7 x weekly - 2 sets - 10 reps - Prone Shoulder Extension - Single Arm  - 1 x daily - 7 x weekly - 2 sets - 10 reps  ASSESSMENT:  CLINICAL IMPRESSION: Mr Kendzierski presents to skilled PT reporting compliance with HEP stating that the nerve stretch has helped a lot. Pt requires some cuing for improved posture during PT session.  Pt is progressing with increased strength and with good twitch response noted following dry needling and pt reported feeling looser.  Pt continues to require skilled PT to progress towards goal related goals.  OBJECTIVE IMPAIRMENTS: decreased activity tolerance, decreased endurance, decreased ROM, hypomobility, increased muscle spasms, impaired flexibility, impaired UE functional use, improper body mechanics, postural dysfunction, and pain.   ACTIVITY LIMITATIONS: carrying, lifting, bending, sitting, sleeping, bathing, dressing, and reach over head  PARTICIPATION LIMITATIONS: meal prep, cleaning, laundry, driving, shopping, community activity, occupation, and yard work  PERSONAL FACTORS: Time since onset of injury/illness/exacerbation are also affecting patient's functional outcome.  REHAB POTENTIAL: Excellent  CLINICAL DECISION MAKING: Stable/uncomplicated  EVALUATION COMPLEXITY: Low   GOALS: Goals reviewed with patient? Yes  SHORT TERM GOALS: Target date: 08/22/2022   Pt will be ind with initial HEP Baseline:  Goal status: MET  2.  Pt will be able to demo proper upright posture without cueing Baseline:  Goal status: IN PROGRESS  3.  Pt will report compliance with adjustments to posture and  setting at home while reading and watching TV to reduce symptoms. Baseline:  Goal status: INITIAL  4.  Pt will report at least 25% less pain with daily tasks. Baseline:  Goal status: INITIAL    LONG TERM GOALS: Target date: 09/19/2022  Pt will be ind with advanced HEP and understand tools for symptom management. Baseline:  Goal status: INITIAL  2.  Pt will report at least 70% less pain with daily tasks. Baseline:  Goal status: INITIAL  3.  Pt will improve FOTO score to at least 61% to demo improved function Baseline: 51% Goal status: INITIAL  4.  Pt will report improved energy and endurance by at least 50% on days in the community for work. Baseline:  Goal status: INITIAL     PLAN:  PT FREQUENCY: 1x/week  PT DURATION: 8 weeks  PLANNED INTERVENTIONS: Therapeutic exercises, Therapeutic activity, Neuromuscular re-education, Patient/Family education, Self Care, Joint mobilization, Joint manipulation, Dry Needling, Electrical stimulation, Spinal manipulation, Spinal mobilization, Cryotherapy, Moist heat, Taping, Traction, and Manual therapy  PLAN FOR NEXT SESSION: review HEP, Manual therapy/DN as needed, review what Pt is doing at home with foam roller and revise at needed, assess and progress HEP as indicated   Juel Burrow, PT 08/22/22 7:38 AM  [facility] [address], [place], [place] [address] Phone # [phone] Fax [phone]

## 2022-08-28 ENCOUNTER — Ambulatory Visit: Payer: 59 | Admitting: Rehabilitative and Restorative Service Providers"

## 2022-08-28 ENCOUNTER — Telehealth: Payer: Self-pay | Admitting: Rehabilitative and Restorative Service Providers"

## 2022-08-28 NOTE — Telephone Encounter (Signed)
Called to notify patient that due to his 2 "no show" appointments, we would be canceling all appointments after 09/08/22.  Reminded pt to please come to appointment or call to reschedule prior to appointment.  No further appointments to be scheduled if pt does not show for his 09/08/22 appointment secondary to attendance policy.

## 2022-09-08 ENCOUNTER — Ambulatory Visit: Payer: 59 | Attending: Adult Health | Admitting: Rehabilitative and Restorative Service Providers"

## 2022-09-08 DIAGNOSIS — R293 Abnormal posture: Secondary | ICD-10-CM | POA: Insufficient documentation

## 2022-09-08 DIAGNOSIS — M546 Pain in thoracic spine: Secondary | ICD-10-CM | POA: Insufficient documentation

## 2022-09-08 DIAGNOSIS — M542 Cervicalgia: Secondary | ICD-10-CM | POA: Insufficient documentation

## 2022-09-08 DIAGNOSIS — R252 Cramp and spasm: Secondary | ICD-10-CM | POA: Insufficient documentation

## 2022-09-15 ENCOUNTER — Encounter: Payer: 59 | Admitting: Rehabilitative and Restorative Service Providers"

## 2022-09-20 ENCOUNTER — Ambulatory Visit: Payer: 59 | Admitting: Family Medicine

## 2022-09-21 ENCOUNTER — Encounter: Payer: Self-pay | Admitting: Adult Health

## 2022-09-22 ENCOUNTER — Ambulatory Visit: Payer: 59 | Admitting: Family Medicine

## 2022-09-22 ENCOUNTER — Encounter: Payer: 59 | Admitting: Rehabilitative and Restorative Service Providers"

## 2022-09-22 ENCOUNTER — Telehealth (INDEPENDENT_AMBULATORY_CARE_PROVIDER_SITE_OTHER): Payer: 59 | Admitting: Family Medicine

## 2022-09-22 ENCOUNTER — Other Ambulatory Visit: Payer: Self-pay | Admitting: Family Medicine

## 2022-09-22 ENCOUNTER — Encounter: Payer: Self-pay | Admitting: Family Medicine

## 2022-09-22 VITALS — Temp 97.2°F

## 2022-09-22 DIAGNOSIS — J0100 Acute maxillary sinusitis, unspecified: Secondary | ICD-10-CM

## 2022-09-22 DIAGNOSIS — U071 COVID-19: Secondary | ICD-10-CM

## 2022-09-22 MED ORDER — FLUTICASONE PROPIONATE 50 MCG/ACT NA SUSP: 1.0000 | Freq: Every day | NASAL | 1 refills | Status: DC

## 2022-09-22 MED ORDER — AMOXICILLIN-POT CLAVULANATE 500-125 MG PO TABS: 1.0000 | ORAL_TABLET | Freq: Two times a day (BID) | ORAL | 0 refills | Status: AC

## 2022-09-22 NOTE — Progress Notes (Signed)
Virtual Visit via Video Note  I connected with Marcus Bullock on 09/22/22 at 10:30 AM EST by a video enabled telemedicine application 2/2 [id number] pandemic and verified that I am speaking with the correct person using two identifiers.  Location patient: home Location provider:work or home office Persons participating in the virtual visit: patient, provider  I discussed the limitations of evaluation and management by telemedicine and the availability of in person appointments. The patient expressed understanding and agreed to proceed. Chief Complaint  Patient presents with   Covid Positive    Pt c/o sore throat, HA, chest tightness, eye and nasal drainage x 5 days; tested pos with COVID 09/21/22 x2    HPI: Pt is a 36 yo male with pmh sig for chronic back pain, depression, seasonal allergies, thoracic spondylosis, vitamin D deficiency, B12 deficiency followed by [name], NP and seen today for acute concern. Patient started feeling sick on Sunday with burning sensation in throat.  Developed sore throat, HA, facial pain/pressure, sneezing, cough, sweating on Monday. Tested positive for COVID yesterday. Feels like facial pain/pressure worse. Tried Robitussin, Mucinex Denies fever, decreased appetite, n/v, loose stools.   ROS: See pertinent positives and negatives per HPI.  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    Past Surgical History:  Procedure Laterality Date   No past surgery      Family History  Problem Relation Age of Onset   Alcohol abuse Father    Ulcers Father    Heart disease Maternal Grandmother    Cancer Paternal Grandmother        breast   Thyroid disease Mother     Current Outpatient Medications:    amoxicillin-clavulanate (AUGMENTIN) 500-125 MG tablet, Take 1 tablet by mouth in the  morning and at bedtime for 7 days., Disp: 14 tablet, Rfl: 0   famotidine (PEPCID) 20 MG tablet, Take 20 mg by mouth daily., Disp: , Rfl:    fexofenadine (ALLEGRA) 180 MG tablet, Take 180 mg by mouth daily., Disp: , Rfl:    fluticasone (FLONASE) 50 MCG/ACT nasal spray, Place 1 spray into both nostrils daily., Disp: 16 g, Rfl: 1   levocetirizine (XYZAL) 5 MG tablet, Take 5 mg by mouth at bedtime., Disp: , Rfl:    NALTREXONE HCL PO, Take 1 mg by mouth daily., Disp: , Rfl:   EXAM:  VITALS per patient if applicable:  RR between 12-20 bpm  GENERAL: alert, oriented, appears well and in no acute distress  HEENT: atraumatic, conjunctiva clear, no obvious abnormalities on inspection of external nose and ears  NECK: normal movements of the head and neck  LUNGS: on inspection no signs of respiratory distress, breathing rate appears normal, no obvious gross SOB, gasping or wheezing  CV: no obvious cyanosis  MS: moves all visible extremities without noticeable abnormality  PSYCH/NEURO: pleasant and cooperative, no obvious depression or anxiety, speech and thought processing grossly intact  ASSESSMENT AND PLAN:  Discussed the following assessment and plan:  COVID-19 virus infection -Symptoms starting 09/17/2022 with positive test 09/21/2022. -Side of window for antiviral medication. -Discussed expectant management and supportive care with OTC medications -Discussed quarantine -Given strict precautions for continued or worsening symptoms  Acute maxillary sinusitis, recurrence not specified -Decongestant, Tylenol or NSAIDs.  - Plan:  amoxicillin-clavulanate (AUGMENTIN) 500-125 MG tablet, fluticasone (FLONASE) 50 MCG/ACT nasal spray  Follow-up as needed for continued or worsening symptoms.   I discussed the assessment and treatment plan with the patient. The patient was provided an opportunity to ask questions and all were answered. The patient agreed with the plan and demonstrated an  understanding of the instructions.   The patient was advised to call back or seek an in-person evaluation if the symptoms worsen or if the condition fails to improve as anticipated.  Deeann Saint, MD

## 2022-09-28 ENCOUNTER — Telehealth: Payer: Self-pay | Admitting: Adult Health

## 2022-09-28 NOTE — Telephone Encounter (Signed)
Pt notified of update and will call back to schedule TOC

## 2022-09-28 NOTE — Telephone Encounter (Signed)
Requesting TOC from [name] to [employer]

## 2022-10-04 NOTE — Telephone Encounter (Signed)
Ok

## 2022-10-11 NOTE — Telephone Encounter (Signed)
Pt does no want to wait until June to have TOC and I would like to know if I can sch this pt sooner

## 2022-10-17 ENCOUNTER — Encounter: Payer: Self-pay | Admitting: Family Medicine

## 2022-10-25 ENCOUNTER — Ambulatory Visit (INDEPENDENT_AMBULATORY_CARE_PROVIDER_SITE_OTHER): Payer: 59 | Admitting: Family Medicine

## 2022-10-25 VITALS — BP 102/80 | HR 85 | Temp 98.8°F | Ht 71.0 in | Wt 190.8 lb

## 2022-10-25 DIAGNOSIS — R202 Paresthesia of skin: Secondary | ICD-10-CM

## 2022-10-25 DIAGNOSIS — R5382 Chronic fatigue, unspecified: Secondary | ICD-10-CM

## 2022-10-25 DIAGNOSIS — M62838 Other muscle spasm: Secondary | ICD-10-CM

## 2022-10-25 DIAGNOSIS — R4189 Other symptoms and signs involving cognitive functions and awareness: Secondary | ICD-10-CM

## 2022-10-25 DIAGNOSIS — L509 Urticaria, unspecified: Secondary | ICD-10-CM

## 2022-10-25 DIAGNOSIS — M791 Myalgia, unspecified site: Secondary | ICD-10-CM

## 2022-10-25 DIAGNOSIS — R002 Palpitations: Secondary | ICD-10-CM

## 2022-10-25 DIAGNOSIS — L989 Disorder of the skin and subcutaneous tissue, unspecified: Secondary | ICD-10-CM | POA: Diagnosis not present

## 2022-10-25 LAB — CBC WITH DIFFERENTIAL/PLATELET
Basophils Absolute: 0 10*3/uL (ref 0.0–0.1)
Basophils Relative: 0.9 % (ref 0.0–3.0)
Eosinophils Absolute: 0.1 10*3/uL (ref 0.0–0.7)
Eosinophils Relative: 1.1 % (ref 0.0–5.0)
HCT: 43.9 % (ref 39.0–52.0)
Hemoglobin: 14.6 g/dL (ref 13.0–17.0)
Lymphocytes Relative: 26.3 % (ref 12.0–46.0)
Lymphs Abs: 1.3 10*3/uL (ref 0.7–4.0)
MCHC: 33.2 g/dL (ref 30.0–36.0)
MCV: 88.2 fl (ref 78.0–100.0)
Monocytes Absolute: 0.6 10*3/uL (ref 0.1–1.0)
Monocytes Relative: 10.9 % (ref 3.0–12.0)
Neutro Abs: 3.1 10*3/uL (ref 1.4–7.7)
Neutrophils Relative %: 60.8 % (ref 43.0–77.0)
Platelets: 218 10*3/uL (ref 150.0–400.0)
RBC: 4.97 Mil/uL (ref 4.22–5.81)
RDW: 12.7 % (ref 11.5–15.5)
WBC: 5.1 10*3/uL (ref 4.0–10.5)

## 2022-10-25 LAB — VITAMIN B12: Vitamin B-12: 276 pg/mL (ref 211–911)

## 2022-10-25 LAB — VITAMIN D 25 HYDROXY (VIT D DEFICIENCY, FRACTURES): VITD: 34.25 ng/mL (ref 30.00–100.00)

## 2022-10-25 LAB — COMPREHENSIVE METABOLIC PANEL
ALT: 17 U/L (ref 0–53)
AST: 16 U/L (ref 0–37)
Albumin: 4.6 g/dL (ref 3.5–5.2)
Alkaline Phosphatase: 48 U/L (ref 39–117)
BUN: 9 mg/dL (ref 6–23)
CO2: 30 mEq/L (ref 19–32)
Calcium: 9.4 mg/dL (ref 8.4–10.5)
Chloride: 104 mEq/L (ref 96–112)
Creatinine, Ser: 1.05 mg/dL (ref 0.40–1.50)
GFR: 90.77 mL/min (ref >60.00)
Glucose, Bld: 85 mg/dL (ref 70–99)
Potassium: 4.1 mEq/L (ref 3.5–5.1)
Sodium: 139 mEq/L (ref 135–145)
Total Bilirubin: 0.5 mg/dL (ref 0.2–1.2)
Total Protein: 7.7 g/dL (ref 6.0–8.3)

## 2022-10-25 LAB — T4, FREE: Free T4: 0.86 ng/dL (ref 0.60–1.60)

## 2022-10-25 LAB — SEDIMENTATION RATE: Sed Rate: 20 mm/hr — ABNORMAL HIGH (ref 0–15)

## 2022-10-25 LAB — TSH: TSH: 1.81 u[IU]/mL (ref 0.35–5.50)

## 2022-10-25 LAB — FOLATE: Folate: 16.2 ng/mL (ref >5.9)

## 2022-10-25 LAB — HEMOGLOBIN A1C: Hgb A1c MFr Bld: 4.6 % (ref 4.6–6.5)

## 2022-10-25 NOTE — Progress Notes (Unsigned)
   Established Patient Office Visit   Subjective  Patient ID: Marcus Bullock, male    DOB: Feb 12, 1986  Age: 37 y.o. MRN: [id number]  Chief Complaint  Patient presents with   Establish Care    Patient is a 37 year old male seen for TOC and follow-up on ongoing conditions.    {History (Optional):23778}  ROS Negative unless stated above    Objective:     BP 102/80 (BP Location: Right Arm, Patient Position: Sitting, Cuff Size: Large)   Pulse 85   Temp 98.8 F (37.1 C) (Oral)   Ht 5\' 11"  (1.803 m)   Wt 190 lb 12.8 oz (86.5 kg)   SpO2 97%   BMI 26.61 kg/m  {Vitals History (Optional):23777}  Physical Exam   No results found for any visits on 10/25/22.    Assessment & Plan:  Urticaria -     CBC with Differential/Platelet  Chronic fatigue -     CBC with Differential/Platelet -     TSH -     T4, free -     Comprehensive metabolic panel -     Vitamin B12 -     VITAMIN D 25 Hydroxy (Vit-D Deficiency, Fractures) -     Sedimentation rate -     Lupus (SLE) Analysis -     Hemoglobin A1c  Muscle spasm -     Comprehensive metabolic panel -     Vitamin B12 -     Folate  Palpitation -     CBC with Differential/Platelet -     TSH -     T4, free -     Comprehensive metabolic panel -     Vitamin B12  Skin lesion -     Sedimentation rate -     Lupus (SLE) Analysis  Muscle pain -     CBC with Differential/Platelet -     Comprehensive metabolic panel -     Sedimentation rate -     Lupus (SLE) Analysis -     Folate  Brain fog -     Sedimentation rate -     Lupus (SLE) Analysis  Paresthesias -     Comprehensive metabolic panel -     Vitamin B12 -     Folate    Return in about 3 weeks (around 11/15/2022), or if symptoms worsen or fail to improve.   Billie Ruddy, MD

## 2022-10-30 ENCOUNTER — Telehealth: Payer: Self-pay | Admitting: Neurology

## 2022-10-30 NOTE — Telephone Encounter (Signed)
Patient has not been examined in the office since 12/22/2020. An appointment will need to be scheduled to examine any changes in the patient's condition.

## 2022-10-30 NOTE — Telephone Encounter (Signed)
Called and LVM with the message from RN and left phone number for pt to call back and schedule appt.

## 2022-10-30 NOTE — Telephone Encounter (Signed)
Pt said, Pins and needles feeling with body pain. Allergist think is a neurological issue, but she's treating for hives. Would like a call from the nurse.

## 2022-11-01 LAB — LUPUS (SLE) ANALYSIS
Anti Nuclear Antibody (ANA): NEGATIVE
Anti-striation Abs: NEGATIVE
Complement C4, Serum: 32 mg/dL (ref 12–38)
ENA RNP Ab: <0.2 AI (ref 0.0–0.9)
ENA SM Ab Ser-aCnc: <0.2 AI (ref 0.0–0.9)
ENA SSA (RO) Ab: <0.2 AI (ref 0.0–0.9)
ENA SSB (LA) Ab: <0.2 AI (ref 0.0–0.9)
Mitochondrial Ab: <20 Units (ref 0.0–20.0)
Parietal Cell Ab: 10.7 Units (ref 0.0–20.0)
Scleroderma (Scl-70) (ENA) Antibody, IgG: <0.2 AI (ref 0.0–0.9)
Smooth Muscle Ab: 47 Units — ABNORMAL HIGH (ref 0–19)
Thyroperoxidase Ab SerPl-aCnc: 10 IU/mL (ref 0–34)
dsDNA Ab: <1 IU/mL (ref 0–9)

## 2022-11-03 ENCOUNTER — Telehealth: Payer: Self-pay | Admitting: Family Medicine

## 2022-11-03 NOTE — Telephone Encounter (Signed)
Pt had a Transfer of Care on 10/25/22 with Dr. Volanda Napoleon Pt is requesting a call back to go over his labs. Please advise.

## 2022-11-07 NOTE — Telephone Encounter (Signed)
See result note.  

## 2022-11-14 ENCOUNTER — Encounter: Payer: Self-pay | Admitting: Family Medicine

## 2022-11-15 ENCOUNTER — Encounter: Payer: Self-pay | Admitting: Family Medicine

## 2022-11-15 ENCOUNTER — Ambulatory Visit: Payer: 59 | Admitting: Family Medicine

## 2022-11-15 VITALS — BP 110/80 | HR 78 | Temp 99.2°F | Ht 71.0 in | Wt 196.0 lb

## 2022-11-15 DIAGNOSIS — R1310 Dysphagia, unspecified: Secondary | ICD-10-CM

## 2022-11-15 NOTE — Progress Notes (Signed)
   Established Patient Office Visit   Subjective  Patient ID: Marcus Bullock, male    DOB: May 12, 1986  Age: 37 y.o. MRN: [id number]  No chief complaint on file.   Pt is a 37 yo male with PMH SIG for seasonal allergies, acid reflux who is seen for follow-up on labs and ongoing concern.  Pt with increasing dysphagia for solids and liquids.  Feels like things get stuck.  Taking pepcid without relief.  Has not see GI for this.      ROS Negative unless stated above    Objective:     BP 110/80   Pulse 78   Temp 99.2 F (37.3 C) (Oral)   Ht 5\' 11"  (1.803 m)   Wt 196 lb (88.9 kg)   SpO2 98%   BMI 27.34 kg/m    Physical Exam Constitutional:      General: He is not in acute distress.    Appearance: Normal appearance.  HENT:     Head: Normocephalic and atraumatic.     Nose: Nose normal.     Mouth/Throat:     Mouth: Mucous membranes are moist.  Eyes:     Extraocular Movements: Extraocular movements intact.     Conjunctiva/sclera: Conjunctivae normal.     Pupils: Pupils are equal, round, and reactive to light.  Cardiovascular:     Rate and Rhythm: Normal rate.     Heart sounds: Normal heart sounds. No murmur heard.    No gallop.  Pulmonary:     Effort: Pulmonary effort is normal.  Skin:    General: Skin is warm and dry.  Neurological:     Mental Status: He is alert and oriented to person, place, and time.     No results found for any visits on 11/15/22.    Assessment & Plan:  Dysphagia, unspecified type -small bite of food with fluids encouraged. -continue pepcid or PPI prn -food diary -     Ambulatory referral to Gastroenterology   Follow-up as needed Billie Ruddy, MD

## 2022-12-19 ENCOUNTER — Ambulatory Visit: Payer: 59 | Admitting: Physician Assistant

## 2023-01-12 ENCOUNTER — Other Ambulatory Visit (HOSPITAL_BASED_OUTPATIENT_CLINIC_OR_DEPARTMENT_OTHER): Payer: Self-pay

## 2023-01-12 ENCOUNTER — Encounter: Payer: Self-pay | Admitting: Family Medicine

## 2023-01-12 ENCOUNTER — Emergency Department (HOSPITAL_BASED_OUTPATIENT_CLINIC_OR_DEPARTMENT_OTHER)
Admission: EM | Admit: 2023-01-12 | Discharge: 2023-01-12 | Disposition: A | Discharge status: Home / Self Care | Payer: 59 | Attending: Obstetrics and Gynecology | Admitting: Obstetrics and Gynecology

## 2023-01-12 ENCOUNTER — Encounter (HOSPITAL_BASED_OUTPATIENT_CLINIC_OR_DEPARTMENT_OTHER): Payer: Self-pay

## 2023-01-12 ENCOUNTER — Emergency Department (HOSPITAL_BASED_OUTPATIENT_CLINIC_OR_DEPARTMENT_OTHER): Payer: 59

## 2023-01-12 ENCOUNTER — Other Ambulatory Visit: Payer: Self-pay

## 2023-01-12 DIAGNOSIS — R109 Unspecified abdominal pain: Secondary | ICD-10-CM | POA: Diagnosis not present

## 2023-01-12 DIAGNOSIS — K625 Hemorrhage of anus and rectum: Secondary | ICD-10-CM | POA: Diagnosis present

## 2023-01-12 LAB — COMPREHENSIVE METABOLIC PANEL
ALT: 20 U/L (ref 0–44)
AST: 18 U/L (ref 15–41)
Albumin: 4.6 g/dL (ref 3.5–5.0)
Alkaline Phosphatase: 38 U/L (ref 38–126)
Anion gap: 9 (ref 5–15)
BUN: 8 mg/dL (ref 6–20)
CO2: 26 mmol/L (ref 22–32)
Calcium: 9.9 mg/dL (ref 8.9–10.3)
Chloride: 104 mmol/L (ref 98–111)
Creatinine, Ser: 0.93 mg/dL (ref 0.61–1.24)
GFR, Estimated: >60 mL/min (ref >60)
Glucose, Bld: 91 mg/dL (ref 70–99)
Potassium: 3.7 mmol/L (ref 3.5–5.1)
Sodium: 139 mmol/L (ref 135–145)
Total Bilirubin: 0.4 mg/dL (ref 0.3–1.2)
Total Protein: 7.6 g/dL (ref 6.5–8.1)

## 2023-01-12 LAB — URINALYSIS, ROUTINE W REFLEX MICROSCOPIC
Bilirubin Urine: NEGATIVE
Glucose, UA: NEGATIVE mg/dL
Hgb urine dipstick: NEGATIVE
Ketones, ur: NEGATIVE mg/dL
Leukocytes,Ua: NEGATIVE
Nitrite: NEGATIVE
Protein, ur: NEGATIVE mg/dL
Specific Gravity, Urine: 1.007 (ref 1.005–1.030)
pH: 7 (ref 5.0–8.0)

## 2023-01-12 LAB — CBC
HCT: 43.5 % (ref 39.0–52.0)
Hemoglobin: 14.5 g/dL (ref 13.0–17.0)
MCH: 29 pg (ref 26.0–34.0)
MCHC: 33.3 g/dL (ref 30.0–36.0)
MCV: 87 fL (ref 80.0–100.0)
Platelets: 261 10*3/uL (ref 150–400)
RBC: 5 MIL/uL (ref 4.22–5.81)
RDW: 11.7 % (ref 11.5–15.5)
WBC: 7.1 10*3/uL (ref 4.0–10.5)
nRBC: 0 % (ref 0.0–0.2)

## 2023-01-12 LAB — OCCULT BLOOD X 1 CARD TO LAB, STOOL: Fecal Occult Bld: NEGATIVE

## 2023-01-12 MED ORDER — POLYETHYLENE GLYCOL 3350 17 GM/SCOOP PO POWD
1.0000 | Freq: Once | ORAL | 0 refills | Status: AC
  Filled 2023-01-12: qty 255, 1d supply, fill #0

## 2023-01-12 NOTE — Discharge Instructions (Addendum)
You are seen in the emergency department for rectal bleeding.  Based on your lab workup as well as imaging results, there is no specific cause that can be identified of what could be causing the rectal bleeding at this time.  Your examination was also reassuring and normal and there was no evidence of rectal bleeding based on the stool guaiac sample collected.  I think at this time the best thing that I could suggest for you would be to encourage follow-up with [facility] gastroenterology as he currently is scheduled.  If you feel that your symptoms are worsening, please return to the emergency department for further evaluation.  Otherwise, I would encourage increasing water intake, as well as fiber supplementation.  If you feel that you are becoming constipated, you may start using a MiraLAX or other laxative over-the-counter medication to promote healthy bowel movements.

## 2023-01-12 NOTE — ED Provider Notes (Signed)
[name] Provider Note   CSN: [id number] Arrival date & time: 01/12/23  1410     History Chief Complaint  Patient presents with   Rectal Bleeding    Marcus Bullock is a 37 y.o. male. Patient presents to the ED with complaints of rectal bleeding. He reports noticing rectal bleeding earlier today after eating breakfast. Denies any pain when this episode occurred. No prior history of hemorrhoids but states dad and brother both have issues with hemorrhoids. Denies increased constipation or straining with bowel movements. Not currently on blood thinners. Denies recent NSAID use or heavy alcohol consumption for an extended period of time. Only closely associated symptom is some right sided abdominal pain that has been present for "several years" with no specific cause noted.   Rectal Bleeding      Home Medications Prior to Admission medications   Medication Sig Start Date End Date Taking? Authorizing Provider  polyethylene glycol powder (GLYCOLAX/MIRALAX) 17 GM/SCOOP powder Take 255 g by mouth once for 1 dose. 01/12/23 01/12/23 Yes Smitty Knudsen, PA-C  famotidine (PEPCID) 20 MG tablet Take 20 mg by mouth daily.    [provider]  fexofenadine (ALLEGRA) 180 MG tablet Take 180 mg by mouth daily.    [provider]  fluticasone (FLONASE) 50 MCG/ACT nasal spray Place 1 spray into both nostrils daily. 09/22/22   Deeann Saint, MD  levocetirizine (XYZAL) 5 MG tablet Take 5 mg by mouth at bedtime.    [provider]  NALTREXONE HCL PO Take 1 mg by mouth daily. 07/06/22   [provider]      Allergies    Bee venom    Review of Systems   Review of Systems  Gastrointestinal:  Positive for blood in stool and hematochezia.  All other systems reviewed and are negative.   Physical Exam Updated Vital Signs BP 137/78 (BP Location: Right Arm)   Pulse (!) 111   Temp 97.8 F (36.6 C) (Temporal)   Resp  18   Ht 5\' 11"  (1.803 m)   Wt 86.2 kg   SpO2 100%   BMI 26.50 kg/m  Physical Exam Vitals and nursing note reviewed. Exam conducted with a chaperone present.  Constitutional:      General: He is not in acute distress.    Appearance: He is well-developed.  HENT:     Head: Normocephalic and atraumatic.  Eyes:     Conjunctiva/sclera: Conjunctivae normal.  Cardiovascular:     Rate and Rhythm: Normal rate and regular rhythm.     Heart sounds: No murmur heard. Pulmonary:     Effort: Pulmonary effort is normal. No respiratory distress.     Breath sounds: Normal breath sounds.  Abdominal:     Palpations: Abdomen is soft.     Tenderness: There is no abdominal tenderness.  Genitourinary:    Rectum: Normal. No mass or anal fissure.     Comments: Some pain with digit insertion of rectal exam. Stool colored fecal matter noted on glove, but no obvious or frank blood. Musculoskeletal:        General: No swelling.     Cervical back: Neck supple.  Skin:    General: Skin is warm and dry.     Capillary Refill: Capillary refill takes less than 2 seconds.  Neurological:     Mental Status: He is alert.  Psychiatric:        Mood and Affect: Mood normal.  ED Results / Procedures / Treatments   Labs (all labs ordered are listed, but only abnormal results are displayed) Labs Reviewed  URINALYSIS, ROUTINE W REFLEX MICROSCOPIC - Abnormal; Notable for the following components:      Result Value   Color, Urine COLORLESS (*)    All other components within normal limits  COMPREHENSIVE METABOLIC PANEL  CBC  OCCULT BLOOD X 1 CARD TO LAB, STOOL    EKG None  Radiology CT ABDOMEN PELVIS WO CONTRAST  Result Date: 01/12/2023 CLINICAL DATA:  Power scribe abdominal pain EXAM: CT ABDOMEN AND PELVIS WITHOUT CONTRAST TECHNIQUE: Multidetector CT imaging of the abdomen and pelvis was performed following the standard protocol without IV contrast. RADIATION DOSE REDUCTION: This exam was performed  according to the departmental dose-optimization program which includes automated exposure control, adjustment of the mA and/or kV according to patient size and/or use of iterative reconstruction technique. COMPARISON:  06/28/2020 FINDINGS: Lower chest: No acute findings are seen. Hepatobiliary: No focal abnormalities are seen in liver. Gallbladder is not distended. There is no dilation of bile ducts. Pancreas: No focal abnormalities are seen. Spleen: Unremarkable. Adrenals/Urinary Tract: Adrenals are unremarkable. There is no hydronephrosis. There are no renal or ureteral stones. Urinary bladder is not distended. Stomach/Bowel: Stomach is unremarkable. Small bowel loops are not dilated. Appendix is not dilated. There is no significant wall thickening in colon. There is no pericolic stranding. Vascular/Lymphatic: Unremarkable. Reproductive: Unremarkable. Other: There is no ascites or pneumoperitoneum. Small umbilical hernia containing fat is seen. Musculoskeletal: Small sclerotic density in body of S1 vertebra may suggest a benign bone island. IMPRESSION: No acute findings are seen. There is no evidence of intestinal obstruction or pneumoperitoneum. There is no hydronephrosis. Appendix is not dilated. Electronically Signed   By: Ernie Avena M.D.   On: 01/12/2023 15:26    Procedures Procedures   Medications Ordered in ED Medications - No data to display  ED Course/ Medical Decision Making/ A&P                           Medical Decision Making Amount and/or Complexity of Data Reviewed Labs: ordered. Radiology: ordered.   This patient presents to the ED for concern of rectal bleeding. Differential diagnosis includes lower GI bleeding, external hemorrhoid, internal hemorrhoid, anal fissure, gastroenteritis   Lab Tests:  I Ordered, and personally interpreted labs.  The pertinent results include:  CBC unremarkable, CMP unremarkable, occult blood negative, urinalysis unremarkable   Imaging  Studies ordered:  I ordered imaging studies including CT abdomen pelvis  I independently visualized and interpreted imaging which showed no acute findings or abnormalities noted. I agree with the radiologist interpretation   Problem List / ED Course:  Patient presented to the ED for painless rectal bleeding. States this is the first time he has had this occur and it happened after eating breakfast this morning. Denies recent changes in BMs. Lab workup initiated to determine if acute blood loss anemia present necessitation transfusion or emergent GI referral but patient denies recurrence of rectal bleeding and CBC unremarkable for anemia at this time.  Other labs are also unremarkable at this time with a normal CMP, negative stool occult, normal urinalysis.  Patient reports that he has follow-up scheduled with [facility] in the next 2 weeks.  He previously saw [facility] but is now planning on seeing [facility] for further workup.  Given lack of conclusive findings, I believe that his symptoms are most likely related  to a hemorrhoid although I cannot visualize any hemorrhoids on my examination of the patient's rectum.  Encourage patient to increase water intake as well as adding a fiber supplement.  Patient will plan on following up with [facility] for further evaluation.  Patient agreeable to treatment plan verbalized understanding return precautions.  All questions answered prior to patient discharge.   Final Clinical Impression(s) / ED Diagnoses Final diagnoses:  Rectal bleeding  Right sided abdominal pain    Rx / DC Orders ED Discharge Orders          Ordered    polyethylene glycol powder (GLYCOLAX/MIRALAX) 17 GM/SCOOP powder   Once        01/12/23 1616              Smitty Knudsen, PA-C 01/12/23 1616    Loetta Rough, MD 01/12/23 2329

## 2023-01-12 NOTE — ED Triage Notes (Signed)
Patient here POV from Home.  Endorses noting Blood in Stool today after eating. Normal BM and somewhat darker.   No Pain. No N/V/D. No fevers.   NAD Noted during Triage. A&Ox4. GCS 15. Ambulatory.

## 2023-01-12 NOTE — ED Notes (Signed)
Occult Card is at bedside.

## 2023-01-17 DIAGNOSIS — L501 Idiopathic urticaria: Secondary | ICD-10-CM | POA: Insufficient documentation

## 2023-01-25 ENCOUNTER — Encounter: Payer: Self-pay | Admitting: Gastroenterology

## 2023-01-25 ENCOUNTER — Other Ambulatory Visit: Payer: 59

## 2023-01-25 ENCOUNTER — Ambulatory Visit: Payer: 59 | Admitting: Gastroenterology

## 2023-01-25 VITALS — BP 130/88 | HR 106 | Ht 71.0 in | Wt 194.0 lb

## 2023-01-25 DIAGNOSIS — R1310 Dysphagia, unspecified: Secondary | ICD-10-CM | POA: Diagnosis not present

## 2023-01-25 DIAGNOSIS — K625 Hemorrhage of anus and rectum: Secondary | ICD-10-CM | POA: Diagnosis not present

## 2023-01-25 DIAGNOSIS — R899 Unspecified abnormal finding in specimens from other organs, systems and tissues: Secondary | ICD-10-CM

## 2023-01-25 DIAGNOSIS — R109 Unspecified abdominal pain: Secondary | ICD-10-CM

## 2023-01-25 DIAGNOSIS — K5900 Constipation, unspecified: Secondary | ICD-10-CM | POA: Diagnosis not present

## 2023-01-25 DIAGNOSIS — K219 Gastro-esophageal reflux disease without esophagitis: Secondary | ICD-10-CM | POA: Diagnosis not present

## 2023-01-25 MED ORDER — PANTOPRAZOLE SODIUM 40 MG PO TBEC: 40.0000 mg | DELAYED_RELEASE_TABLET | Freq: Every day | ORAL | 3 refills | Status: DC

## 2023-01-25 MED ORDER — POLYETHYLENE GLYCOL 3350 17 G PO PACK: 17.0000 g | PACK | Freq: Every day | ORAL | 0 refills | Status: DC

## 2023-01-25 NOTE — Patient Instructions (Signed)
If your blood pressure at your visit was 140/90 or greater, please contact your primary care physician to follow up on this. ______________________________________________________  If you are age 38 or older, your body mass index should be between 23-30. Your Body mass index is 27.06 kg/m. If this is out of the aforementioned range listed, please consider follow up with your Primary Care Provider.  If you are age 35 or younger, your body mass index should be between 19-25. Your Body mass index is 27.06 kg/m. If this is out of the aformentioned range listed, please consider follow up with your Primary Care Provider.  ________________________________________________________  The [facility] providers would like to encourage you to use [facility] to communicate with providers for non-urgent requests or questions.  Due to long hold times on the telephone, sending your provider a message by [facility] may be a faster and more efficient way to get a response.  Please allow 48 business hours for a response.  Please remember that this is for non-urgent requests.  _______________________________________________________  Due to recent changes in healthcare laws, you may see the results of your imaging and laboratory studies on MyChart before your provider has had a chance to review them.  We understand that in some cases there may be results that are confusing or concerning to you. Not all laboratory results come back in the same time frame and the provider may be waiting for multiple results in order to interpret others.  Please give [place] 48 hours in order for your provider to thoroughly review all the results before contacting the office for clarification of your results.   We will request your records from Dr. Louis Matte office of your EGD and colonoscopy.  You have been scheduled for a Barium Esophogram at [facility] (1st floor of the hospital) on Monday, 4-29 at 11:00am. Please arrive 15 minutes prior  to your appointment for registration. Make certain not to have anything to eat or drink 3 hours prior to your test. If you need to reschedule for any reason, please contact radiology at [phone] to do so. ______________________________________________________________ A barium swallow is an examination that concentrates on views of the esophagus. This tends to be a double contrast exam (barium and two liquids which, when combined, create a gas to distend the wall of the oesophagus) or single contrast (non-ionic iodine based). The study is usually tailored to your symptoms so a good history is essential. Attention is paid during the study to the form, structure and configuration of the esophagus, looking for functional disorders (such as aspiration, dysphagia, achalasia, motility and reflux) EXAMINATION You may be asked to change into a gown, depending on the type of swallow being performed. A radiologist and radiographer will perform the procedure. The radiologist will advise you of the type of contrast selected for your procedure and direct you during the exam. You will be asked to stand, sit or lie in several different positions and to hold a small amount of fluid in your mouth before being asked to swallow while the imaging is performed .In some instances you may be asked to swallow barium coated marshmallows to assess the motility of a solid food bolus. The exam can be recorded as a digital or video fluoroscopy procedure. POST PROCEDURE It will take 1-2 days for the barium to pass through your system. To facilitate this, it is important, unless otherwise directed, to increase your fluids for the next 24-48hrs and to resume your normal diet.  This test typically  takes about 30 minutes to perform. ___________________________________________________________  Please go to the lab in the basement of our building to have lab work done as you leave today. Hit "B" for basement when you get on the  elevator.  When the doors open the lab is on your left.  We will call you with the results. Thank you.  We have sent the following medications to your pharmacy for you to pick up at your convenience: Protonix 40 mg: Take once daily   Please purchase the following medications over the counter and take as directed: Miralax: once daily as directed and titrate as needed You can try Capsaicin cream or Lidocaine patches topically to your side  Thank you for entrusting me with your care and for choosing [facility], Dr. Ileene Patrick

## 2023-01-25 NOTE — Progress Notes (Signed)
HPI :  37 year old male with a history of GERD, dysphagia, reported food sensitivities, abdominal pain, reported fibromyalgia, here to establish care for some of these issues today, referred by Abbe Amsterdam, MD.  Multiple issues/complaints he wishes to address today.  Since that he has been having some occasional dysphagia over time.  He will feel food gets stuck in his throat area occasionally.  This has been ongoing for a few years now.  He is also had some reflux in the past for which has been treated initially with Pepcid as well as a trial of omeprazole.  It sounds like he was on that a few years ago and worked okay to control his pyrosis.  He does not really have much pyrosis and reflux right now that bothers him, but he does have some intermittent chest tightness and discomfort he states it has been ongoing for some time.  He states it is generally present all the time.  He also has diffuse body aches and states his body feels "fatigued".  He has been evaluated by rheumatologist and neurology, states he was told he had fibromyalgia.  He was found to have a strongly positive smooth muscle antibody but a normal ANA.  His liver enzymes have been normal.  He inquires about autoimmune hepatitis.  He also has what sounds like chronic urticaria that can be reproduced with exercise, change in temperature, or anxiety.  He has been followed by an allergist for that.  He states he saw Dr. Bosie Clos and had an upper endoscopy about 2 years ago.  He denies having any dilation done, unclear if he had biopsies to rule out EOE.  He is not sure of the report but told it looked okay.  He states he also had a colonoscopy at that time with Dr. Bosie Clos.  Was told it was normal.  He was having problems with abdominal pain at the time apparently.  He states he continues to have focal right sided abdominal pain for the past 2 and half years.  He rates it 4-10 at baseline and is present all the time for the most part.  He  is focally tender to the area and when he pushes on it it hurts.  He denies any trauma to the area.  Denies any positional changes.  Eating does not make it any worse, having a bowel movement does not improve it at all.  He reports being followed by orthopedics for back pain.  He has had an MRI of his back and they are considering injection therapies for him.  He is also seeing acupuncture for his chronic pain.  He thinks they have tried doing acupuncture on his abdomen for this pain in the past 1 time.  He has had a CT scan of his abdomen and pelvis on April 12 for this issue which showed no cause of his pain.  He has also had a CT scan of his abdomen pelvis in September 2021 which did not show any cause for abdominal pain.  He did have reflux noted on that exam.  He reports having some constipation.  Is having a bowel movement once every 2 to 3 days but having a hard time getting stool out.  Generally he does not have any blood in his stool however 1 week ago when he was passing stool he had a large amount of bright red blood that he saw in the toilet which scared him.  He went to the ED and had normal  blood work, no anemia, normal CT scan.  He denies any family history of colon cancer.  He has not had any recurrent bleeding since that 1 episode.  He otherwise states he feels he is gluten sensitive, has been ruled out for celiac disease in the past.  Tries to avoid known food triggers.  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     Past Surgical History:  Procedure Laterality Date   No past surgery     Family History  Problem Relation Age of Onset   Thyroid disease Mother    Alcohol abuse Father    Ulcers Father    Heart disease Maternal Grandmother    Cancer Paternal Grandmother        breast   Liver  disease Neg Hx    Esophageal cancer Neg Hx    Colon cancer Neg Hx    Social History   Tobacco Use   Smoking status: Never   Smokeless tobacco: Never  Vaping Use   Vaping Use: Never used  Substance Use Topics   Alcohol use: Not Currently    Comment: Socially    Drug use: Not Currently   Current Outpatient Medications  Medication Sig Dispense Refill   pantoprazole (PROTONIX) 40 MG tablet Take 1 tablet (40 mg total) by mouth daily. 30 tablet 3   polyethylene glycol (MIRALAX) 17 g packet Take 17 g by mouth daily. 14 each 0   famotidine (PEPCID) 20 MG tablet Take 20 mg by mouth daily. (Patient not taking: Reported on 01/25/2023)     fexofenadine (ALLEGRA) 180 MG tablet Take 180 mg by mouth daily. (Patient not taking: Reported on 01/25/2023)     fluticasone (FLONASE) 50 MCG/ACT nasal spray Place 1 spray into both nostrils daily. (Patient not taking: Reported on 01/25/2023) 16 g 1   levocetirizine (XYZAL) 5 MG tablet Take 5 mg by mouth at bedtime. (Patient not taking: Reported on 01/25/2023)     NALTREXONE HCL PO Take 1 mg by mouth daily. (Patient not taking: Reported on 01/25/2023)     No current facility-administered medications for this visit.   Allergies  Allergen Reactions   Bee Venom Swelling     Review of Systems: All systems reviewed and negative except where noted in HPI.    CT ABDOMEN PELVIS WO CONTRAST  Result Date: 01/12/2023 CLINICAL DATA:  Power scribe abdominal pain EXAM: CT ABDOMEN AND PELVIS WITHOUT CONTRAST TECHNIQUE: Multidetector CT imaging of the abdomen and pelvis was performed following the standard protocol without IV contrast. RADIATION DOSE REDUCTION: This exam was performed according to the departmental dose-optimization program which includes automated exposure control, adjustment of the mA and/or kV according to patient size and/or use of iterative reconstruction technique. COMPARISON:  06/28/2020 FINDINGS: Lower chest: No acute findings are seen.  Hepatobiliary: No focal abnormalities are seen in liver. Gallbladder is not distended. There is no dilation of bile ducts. Pancreas: No focal abnormalities are seen. Spleen: Unremarkable. Adrenals/Urinary Tract: Adrenals are unremarkable. There is no hydronephrosis. There are no renal or ureteral stones. Urinary bladder is not distended. Stomach/Bowel: Stomach is unremarkable. Small bowel loops are not dilated. Appendix is not dilated. There is no significant wall thickening in colon. There is  no pericolic stranding. Vascular/Lymphatic: Unremarkable. Reproductive: Unremarkable. Other: There is no ascites or pneumoperitoneum. Small umbilical hernia containing fat is seen. Musculoskeletal: Small sclerotic density in body of S1 vertebra may suggest a benign bone island. IMPRESSION: No acute findings are seen. There is no evidence of intestinal obstruction or pneumoperitoneum. There is no hydronephrosis. Appendix is not dilated. Electronically Signed   By: Ernie Avena M.D.   On: 01/12/2023 15:26    Physical Exam: BP 130/88   Pulse (!) 106   Ht 5\' 11"  (1.803 m)   Wt 194 lb (88 kg)   BMI 27.06 kg/m  Constitutional: Pleasant,well-developed,male in no acute distress. HEENT: Normocephalic and atraumatic. Conjunctivae are normal. No scleral icterus. Neck supple.  Cardiovascular: Normal rate, regular rhythm.  Pulmonary/chest: Effort normal and breath sounds normal.  Abdominal: Soft, nondistended, localized R mid abdominal TTP with positive Carnett sign . There are no masses palpable. No hepatomegaly. DRE / Anoscopy - CMA Lucius Conn as standby - internal hemorrhoids noted in all locations, no fissure, no polyp or mass lesion Extremities: no edema Lymphadenopathy: No cervical adenopathy noted. Neurological: Alert and oriented to person place and time. Skin: Skin is warm and dry. No rashes noted. Psychiatric: Normal mood and affect. Behavior is normal.   ASSESSMENT: 37 y.o. male here for assessment  of the following  1. Dysphagia, unspecified type   2. Gastroesophageal reflux disease, unspecified whether esophagitis present   3. Abdominal pain, unspecified abdominal location   4. Constipation, unspecified constipation type   5. Rectal bleeding   6. Abnormal laboratory test    New patient evaluation for multiple issues as outlined above today.  History of GERD with intermittent dysphagia.  Sounds stable over time.  He has had an EGD in the past a few years ago for some of the symptoms.  He does have spontaneous reflux noted on CT scan and had improved with trial of PPI in the past, stopped taking it.  Does have some intermittent chest discomfort (chronic, does not sound exertional or cardiac).  Certainly reflux could be causing his chest symptoms and recommend resuming PPI, we discussed options and will go with Protonix 40 mg daily. Otherwise in regards to his prior workup, need to get report of his prior EGD, see if he was tested for EOE and if he was dilated.  In the interim we will get a barium swallow to assess his motility and see if we can help localize his dysphagia.  If he has a clear stricture, will need dilation.  I reviewed his abdominal pain history with him and exam findings.  CT reassuring.  I think this is very likely musculoskeletal/abdominal wall pain.  Discussed options with him.  He does have some lidocaine patches for his back, he can try applying that to his abdominal wall to see if that helps.  He can also try some topical muscle rubs or capsaicin cream.  He is seeing an acupuncturist, he should let them know about this to see if he may benefit from treatment there.  If symptoms persist over time despite this, can consider sports medicine evaluation for trigger point injection.  Regarding his constipation, recommend MiraLAX daily and titrate up or down as needed.  This should help with his bloating and hopefully reduce risk for recurrent hemorrhoidal bleeding.  Based on his  anoscopy, while we will obtain his last colonoscopy report from [facility], suspect his bleeding was very likely hemorrhoidal in etiology.  We discussed how to treat this  with management of his constipation.  He has not had recurrence since day of his ED visit, will monitor.  Otherwise, he reports chronic fatigue, had a rheumatology evaluation.  Noted to have a strongly positive smooth muscle antibody with a negative ANA.  With normal liver enzymes, autoimmune hepatitis is unlikely although, we will repeat his smooth muscle antibody to see if that remains positive and also check an IgG level.  If the IgG is completely normal and SMA is negative, again autoimmune hepatitis seems very unlikely especially with normal liver enzymes.  Reassured him.  PLAN: - trial of protonix  / day for 30 days - contact me to see if this helps his chest symptoms or not - barium swallow with tablet - try to localize dysphagia, assess for stricture, rule out dysmolity - records of EGD and colonoscopy done by Dr. Bosie Clos - 2 years ago to confirm findings, consider empiric dilation pending course - Miralax daily - titrate up or down as needed, should help bloating and hemorrhoids - reassured regarding abdominal pain - likely musculoskeletal / neuropathic - trial of capsaicin ointment or lidocaine patches to side, or use acupuncture  - lab for IgG, smooth muscle AB - think AIH unlikely   Harlin Rain, MD [facility]  CC: Deeann Saint, MD

## 2023-01-28 LAB — IGG: IgG (Immunoglobin G), Serum: 1347 mg/dL (ref 600–1640)

## 2023-01-28 LAB — ANTI-SMOOTH MUSCLE ANTIBODY, IGG: Actin (Smooth Muscle) Antibody (IGG): 22 U — ABNORMAL HIGH (ref <20)

## 2023-01-29 ENCOUNTER — Ambulatory Visit (HOSPITAL_COMMUNITY): Admission: RE | Admit: 2023-01-29 | Payer: 59 | Source: Ambulatory Visit

## 2023-02-07 ENCOUNTER — Telehealth: Payer: Self-pay | Admitting: Gastroenterology

## 2023-02-07 NOTE — Telephone Encounter (Signed)
Results of prior evaluation came in.  EGD 06/28/21 - Dr. Bosie Clos - gastritis, z line regular, normal duodenum, small HH - biopsies of small bowel and stomach negative  Colonoscopy 06/28/21 - Dr. Bosie Clos - 2mm polyp removed, diverticulosis, hemorrhoids - benign path, lymphoid aggregate  Awaiting barium study with further recommendations

## 2023-03-08 ENCOUNTER — Encounter: Payer: 59 | Admitting: Family Medicine

## 2023-04-16 ENCOUNTER — Ambulatory Visit: Payer: 59 | Admitting: Family Medicine

## 2023-04-16 ENCOUNTER — Encounter: Payer: Self-pay | Admitting: Family Medicine

## 2023-04-16 VITALS — BP 110/78 | HR 90 | Temp 98.6°F | Ht 70.5 in | Wt 186.8 lb

## 2023-04-16 DIAGNOSIS — J029 Acute pharyngitis, unspecified: Secondary | ICD-10-CM

## 2023-04-16 DIAGNOSIS — Z0000 Encounter for general adult medical examination without abnormal findings: Secondary | ICD-10-CM

## 2023-04-16 DIAGNOSIS — Z113 Encounter for screening for infections with a predominantly sexual mode of transmission: Secondary | ICD-10-CM

## 2023-04-16 DIAGNOSIS — R52 Pain, unspecified: Secondary | ICD-10-CM

## 2023-04-16 DIAGNOSIS — J069 Acute upper respiratory infection, unspecified: Secondary | ICD-10-CM

## 2023-04-16 DIAGNOSIS — R5383 Other fatigue: Secondary | ICD-10-CM

## 2023-04-16 DIAGNOSIS — Z0001 Encounter for general adult medical examination with abnormal findings: Secondary | ICD-10-CM

## 2023-04-16 LAB — COMPREHENSIVE METABOLIC PANEL
ALT: 15 U/L (ref 0–53)
AST: 16 U/L (ref 0–37)
Albumin: 4.5 g/dL (ref 3.5–5.2)
Alkaline Phosphatase: 41 U/L (ref 39–117)
BUN: 9 mg/dL (ref 6–23)
CO2: 28 mEq/L (ref 19–32)
Calcium: 9.9 mg/dL (ref 8.4–10.5)
Chloride: 103 mEq/L (ref 96–112)
Creatinine, Ser: 1.12 mg/dL (ref 0.40–1.50)
GFR: 83.73 mL/min (ref >60.00)
Glucose, Bld: 88 mg/dL (ref 70–99)
Potassium: 4 mEq/L (ref 3.5–5.1)
Sodium: 138 mEq/L (ref 135–145)
Total Bilirubin: 0.7 mg/dL (ref 0.2–1.2)
Total Protein: 7.6 g/dL (ref 6.0–8.3)

## 2023-04-16 LAB — CBC WITH DIFFERENTIAL/PLATELET
Basophils Absolute: 0 10*3/uL (ref 0.0–0.1)
Basophils Relative: 0.6 % (ref 0.0–3.0)
Eosinophils Absolute: 0.1 10*3/uL (ref 0.0–0.7)
Eosinophils Relative: 1.2 % (ref 0.0–5.0)
HCT: 44.4 % (ref 39.0–52.0)
Hemoglobin: 14.4 g/dL (ref 13.0–17.0)
Lymphocytes Relative: 24.5 % (ref 12.0–46.0)
Lymphs Abs: 1.6 10*3/uL (ref 0.7–4.0)
MCHC: 32.3 g/dL (ref 30.0–36.0)
MCV: 90 fl (ref 78.0–100.0)
Monocytes Absolute: 0.6 10*3/uL (ref 0.1–1.0)
Monocytes Relative: 10 % (ref 3.0–12.0)
Neutro Abs: 4.1 10*3/uL (ref 1.4–7.7)
Neutrophils Relative %: 63.7 % (ref 43.0–77.0)
Platelets: 232 10*3/uL (ref 150.0–400.0)
RBC: 4.93 Mil/uL (ref 4.22–5.81)
RDW: 12.6 % (ref 11.5–15.5)
WBC: 6.4 10*3/uL (ref 4.0–10.5)

## 2023-04-16 LAB — LIPID PANEL
Cholesterol: 174 mg/dL (ref 0–200)
HDL: 54.5 mg/dL (ref >39.00)
LDL Cholesterol: 106 mg/dL — ABNORMAL HIGH (ref 0–99)
NonHDL: 119.25
Total CHOL/HDL Ratio: 3
Triglycerides: 64 mg/dL (ref 0.0–149.0)
VLDL: 12.8 mg/dL (ref 0.0–40.0)

## 2023-04-16 LAB — POCT URINALYSIS DIPSTICK
Bilirubin, UA: NEGATIVE
Blood, UA: NEGATIVE
Glucose, UA: NEGATIVE
Ketones, UA: NEGATIVE
Leukocytes, UA: NEGATIVE
Nitrite, UA: NEGATIVE
Protein, UA: POSITIVE — AB
Spec Grav, UA: >=1.03 — AB (ref 1.010–1.025)
Urobilinogen, UA: 1 E.U./dL
pH, UA: 6 (ref 5.0–8.0)

## 2023-04-16 LAB — T4, FREE: Free T4: 0.63 ng/dL (ref 0.60–1.60)

## 2023-04-16 LAB — POC COVID19 BINAXNOW: SARS Coronavirus 2 Ag: NEGATIVE

## 2023-04-16 LAB — FOLATE: Folate: 14.2 ng/mL (ref >5.9)

## 2023-04-16 LAB — VITAMIN D 25 HYDROXY (VIT D DEFICIENCY, FRACTURES): VITD: 30.5 ng/mL (ref 30.00–100.00)

## 2023-04-16 LAB — POCT RAPID STREP A (OFFICE): Rapid Strep A Screen: NEGATIVE

## 2023-04-16 LAB — HEMOGLOBIN A1C: Hgb A1c MFr Bld: 4.3 % — ABNORMAL LOW (ref 4.6–6.5)

## 2023-04-16 LAB — TSH: TSH: 1.47 u[IU]/mL (ref 0.35–5.50)

## 2023-04-16 LAB — VITAMIN B12: Vitamin B-12: 197 pg/mL — ABNORMAL LOW (ref 211–911)

## 2023-04-16 NOTE — Progress Notes (Signed)
Established Patient Office Visit   Subjective  Patient ID: Marcus Bullock, male    DOB: [birth year]  Age: 37 y.o. MRN: [id number]  Chief Complaint  Patient presents with   Annual Exam    Patient is a 37 year old male presents for CPE and acute concern.  Patient states he started feeling sick with bodyaches, chills, headache, cough, sore throat on Friday.  Symptoms worse over the weekend.  Feeling slightly better today.  Tried Robitussin and Mucinex for symptoms.  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  States overall chronic fatigue, brain fog, and right side pain still present but other symptoms better.  Symptoms seem worse in the winter when patient has hives.    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   Past Surgical History:  Procedure Laterality Date   No past surgery     Social History   Tobacco Use   Smoking status: Never   Smokeless tobacco: Never  Vaping Use   Vaping status: Never Used  Substance Use Topics   Alcohol use: Not Currently    Comment: Socially    Drug use: Not Currently   Family History  Problem Relation Age of Onset   Thyroid disease Mother    Alcohol abuse Father    Ulcers Father    Heart disease Maternal Grandmother    Cancer Paternal Grandmother        breast   Liver disease Neg Hx    Esophageal cancer Neg Hx    Colon cancer Neg Hx    Allergies  Allergen Reactions   Bee Venom Swelling      ROS Negative unless stated above    Objective:     BP 110/78 (BP Location: Left Arm, Patient Position: Sitting, Cuff Size: Normal)   Pulse 90   Temp 98.6 F (37 C) (Oral)   Ht 5' 10.5" (1.791 m)   Wt 186 lb 12.8 oz (84.7 kg)   SpO2 96%   BMI 26.42 kg/m    Physical Exam Constitutional:      Appearance:  Normal appearance.  HENT:     Head: Normocephalic and atraumatic.     Right Ear: Tympanic membrane, ear canal and external ear normal.     Left Ear: Tympanic membrane, ear canal and external ear normal.     Nose: Nose normal.     Mouth/Throat:     Mouth: Mucous membranes are moist.     Pharynx: No oropharyngeal exudate or posterior oropharyngeal erythema.  Eyes:     General: No scleral icterus.    Extraocular Movements: Extraocular movements intact.     Conjunctiva/sclera: Conjunctivae normal.     Pupils: Pupils are equal, round, and reactive to light.  Neck:     Thyroid: No thyromegaly.  Cardiovascular:     Rate and Rhythm: Normal rate and regular rhythm.     Pulses: Normal pulses.     Heart sounds: Normal heart sounds. No murmur heard.    No friction rub.  Pulmonary:     Effort: Pulmonary effort is normal.     Breath sounds: Normal breath sounds. No wheezing, rhonchi or rales.  Abdominal:     General:  Bowel sounds are normal.     Palpations: Abdomen is soft.     Tenderness: There is no abdominal tenderness.  Musculoskeletal:        General: No deformity. Normal range of motion.  Lymphadenopathy:     Cervical: No cervical adenopathy.  Skin:    General: Skin is warm and dry.     Findings: No lesion.  Neurological:     General: No focal deficit present.     Mental Status: He is alert and oriented to person, place, and time.  Psychiatric:        Mood and Affect: Mood normal.        Thought Content: Thought content normal.      Results for orders placed or performed in visit on 04/16/23  POC Rapid Strep A  Result Value Ref Range   Rapid Strep A Screen Negative Negative  POC COVID-19  Result Value Ref Range   SARS Coronavirus 2 Ag Negative Negative      Assessment & Plan:  Encounter for routine adult health examination with abnormal findings -     Comprehensive metabolic panel -     Lipid panel -     POCT urinalysis dipstick; Future  Routine screening for STI  (sexually transmitted infection) -     C. trachomatis/N. gonorrhoeae RNA -     HIV Antibody (routine testing w rflx) -     RPR -     POCT urinalysis dipstick; Future  Sore throat -     POCT rapid strep A -     POC COVID-19 BinaxNow -     CBC with Differential/Platelet  Body aches -     POC COVID-19 BinaxNow -     CBC with Differential/Platelet -     Vitamin B12 -     Folate  Other fatigue -     TSH -     T4, free -     VITAMIN D 25 Hydroxy (Vit-D Deficiency, Fractures) -     Vitamin B12 -     Folate -     POCT urinalysis dipstick; Future  Viral URI -     CBC with Differential/Platelet -     Hemoglobin A1c   Age-appropriate health screenings discussed.  Will obtain labs.  Colonoscopy done 06/28/2021.  COVID and strep testing negative.  Continue supportive care for viral URI.  Ongoing fatigue stable.  Will reevaluate.   No follow-ups on file.   Deeann Saint, MD

## 2023-04-17 LAB — C. TRACHOMATIS/N. GONORRHOEAE RNA
C. trachomatis RNA, TMA: NOT DETECTED
N. gonorrhoeae RNA, TMA: NOT DETECTED

## 2023-04-17 LAB — HIV ANTIBODY (ROUTINE TESTING W REFLEX): HIV 1&2 Ab, 4th Generation: NONREACTIVE

## 2023-04-17 LAB — RPR: RPR Ser Ql: NONREACTIVE

## 2023-05-16 ENCOUNTER — Ambulatory Visit: Payer: 59 | Admitting: Family Medicine

## 2023-05-16 ENCOUNTER — Encounter: Payer: Self-pay | Admitting: Family Medicine

## 2023-05-16 VITALS — BP 96/78 | HR 97 | Temp 98.9°F | Ht 70.5 in | Wt 186.0 lb

## 2023-05-16 DIAGNOSIS — J029 Acute pharyngitis, unspecified: Secondary | ICD-10-CM | POA: Diagnosis not present

## 2023-05-16 DIAGNOSIS — Z20822 Contact with and (suspected) exposure to covid-19: Secondary | ICD-10-CM | POA: Diagnosis not present

## 2023-05-16 DIAGNOSIS — R051 Acute cough: Secondary | ICD-10-CM | POA: Diagnosis not present

## 2023-05-16 DIAGNOSIS — J0140 Acute pansinusitis, unspecified: Secondary | ICD-10-CM

## 2023-05-16 LAB — POCT RAPID STREP A (OFFICE): Rapid Strep A Screen: NEGATIVE

## 2023-05-16 LAB — POC COVID19 BINAXNOW: SARS Coronavirus 2 Ag: NEGATIVE

## 2023-05-16 MED ORDER — AMOXICILLIN-POT CLAVULANATE 500-125 MG PO TABS
1.0000 | ORAL_TABLET | Freq: Two times a day (BID) | ORAL | 0 refills | Status: AC
Start: 2023-05-16 — End: 2023-05-23

## 2023-05-16 NOTE — Progress Notes (Signed)
Established Patient Office Visit   Subjective  Patient ID: Marcus Bullock, male    DOB: 01/21/1986  Age: 37 y.o. MRN: [id number]  Chief Complaint  Patient presents with   Cough    Patient came in today for a cough congestion sore throat, patient was exposed to covid on Monday, ([facility])    Patient is a 37 year old male seen for acute concern.  Patient endorses headache, cough, nasal congestion, sore throat/burning in throat x 1 and half weeks.  Patient states he initially became sick after friend visited from [place] state.  Patient tried numerous over-the-counter medications including Robitussin, Sudafed cold and sinus without improvement in symptoms.  Patient notes his mother came to visit from [place] this week who was positive for COVID.  Patient states he took a home COVID test that was negative earlier this week.  Patient mentions he is taking OTC vitamin B12 2000 mg daily after labs during physical have low normal B12.  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   Past Surgical History:  Procedure Laterality Date   No past surgery     Social History   Tobacco Use   Smoking status: Never   Smokeless tobacco: Never  Vaping Use   Vaping status: Never Used  Substance Use Topics   Alcohol use: Not Currently    Comment: Socially    Drug use: Not Currently   Family History  Problem Relation Age of Onset   Thyroid disease Mother    Alcohol abuse Father    Ulcers Father    Heart disease Maternal Grandmother    Cancer Paternal Grandmother        breast   Liver disease Neg Hx    Esophageal cancer Neg Hx    Colon cancer Neg Hx    Allergies  Allergen Reactions   Bee Venom Swelling      Review of Systems  Respiratory:  Positive for cough.    Negative unless  stated above    Objective:     BP 96/78 (BP Location: Left Arm, Patient Position: Sitting, Cuff Size: Normal)   Pulse 97   Temp 98.9 F (37.2 C) (Oral)   Ht 5' 10.5" (1.791 m)   Wt 186 lb (84.4 kg)   SpO2 99%   BMI 26.31 kg/m    Physical Exam Constitutional:      General: He is not in acute distress.    Appearance: Normal appearance.  HENT:     Head: Normocephalic and atraumatic.     Nose:     Right Sinus: Maxillary sinus tenderness and frontal sinus tenderness present.     Left Sinus: Maxillary sinus tenderness present.     Mouth/Throat:     Mouth: Mucous membranes are moist.  Eyes:     Conjunctiva/sclera: Conjunctivae normal.  Cardiovascular:     Rate and Rhythm: Normal rate and regular rhythm.     Heart sounds: Normal heart sounds. No murmur heard.    No gallop.  Pulmonary:     Effort: Pulmonary effort is normal. No respiratory distress.     Breath sounds: Normal breath sounds. No wheezing, rhonchi or rales.  Skin:    General: Skin  is warm and dry.  Neurological:     Mental Status: He is alert and oriented to person, place, and time.      Results for orders placed or performed in visit on 05/16/23  POC COVID-19 BinaxNow  Result Value Ref Range   SARS Coronavirus 2 Ag Negative Negative  POCT rapid strep A  Result Value Ref Range   Rapid Strep A Screen Negative Negative      Assessment & Plan:  Acute pansinusitis, recurrence not specified -     Amoxicillin-Pot Clavulanate; Take 1 tablet by mouth in the morning and at bedtime for 7 days.  Dispense: 14 tablet; Refill: 0  Acute cough -     POC COVID-19 BinaxNow  Sore throat -     POC COVID-19 BinaxNow -     POCT rapid strep A  Exposure to COVID-19 virus -     POC COVID-19 BinaxNow  Patient with viral URI symptoms x 1-1/2 weeks.  POC COVID and strep testing negative in clinic.  Discussed continuing supportive care.  Will start ABX for ongoing sinusitis.  Given strict precautions  Return if symptoms  worsen or fail to improve.   Deeann Saint, MD

## 2023-05-30 ENCOUNTER — Ambulatory Visit: Payer: 59 | Admitting: Family Medicine

## 2023-05-30 VITALS — BP 112/68 | HR 88 | Temp 98.7°F | Wt 182.2 lb

## 2023-05-30 DIAGNOSIS — M2550 Pain in unspecified joint: Secondary | ICD-10-CM

## 2023-05-30 DIAGNOSIS — M797 Fibromyalgia: Secondary | ICD-10-CM

## 2023-05-30 DIAGNOSIS — M6208 Separation of muscle (nontraumatic), other site: Secondary | ICD-10-CM | POA: Diagnosis not present

## 2023-05-30 DIAGNOSIS — K219 Gastro-esophageal reflux disease without esophagitis: Secondary | ICD-10-CM | POA: Diagnosis not present

## 2023-05-30 NOTE — Patient Instructions (Addendum)
A referral was placed to [facility] rheumatology clinic and [facility] in [place].  They will call you about setting up an appointment.  Do not forget to contact the gastroenterologist in regards to setting up EGD.

## 2023-05-30 NOTE — Progress Notes (Signed)
Established Patient Office Visit   Subjective  Patient ID: Marcus Bullock, male    DOB: October 15, 1985  Age: 37 y.o. MRN: [id number]  Chief Complaint  Patient presents with   Cyst    Was feeling his stomach and felt a knot, also has some pain. Noticed it a week ago, painful only wen touches it.     Pt is a 37 year old male who presents for acute concern. Pt with midepigastric soreness with palpation x 1 week.  Patient does not recall any injury, denies pushing, pulling, heavy lifting, constipation, diarrhea.  Soreness does not change with eating.  Patient was concerned about hernia. Per chart review CT abdomen pelvis 01/12/2023 with small umbilical fat-containing hernia, otherwise negative.  Area of soreness is superior to umbilicus.  Pt notes continued sensation of chest pressure.  At times has to lean forward for relief.  Seen by GI.  Given PPI.  Thinks GERD symptoms has improved but unsure about chest pressure.  Asked if had EGD done.  Patient states GI wanted to but he did not schedule it.  Pt with mild improvement in overall pain.  Still think symptoms are more than fibromyalgia.  Previously seen by rheumatology at [facility].    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   Past Surgical History:  Procedure Laterality Date   No past surgery     Social History   Tobacco Use   Smoking status: Never   Smokeless tobacco: Never  Vaping Use   Vaping status: Never Used  Substance Use Topics   Alcohol use: Not Currently    Comment: Socially    Drug use: Not Currently   Family History  Problem Relation Age of Onset   Thyroid disease Mother    Alcohol abuse Father    Ulcers Father    Heart disease Maternal Grandmother    Cancer Paternal Grandmother        breast   Liver disease  Neg Hx    Esophageal cancer Neg Hx    Colon cancer Neg Hx    Allergies  Allergen Reactions   Bee Venom Swelling      ROS Negative unless stated above    Objective:     BP 112/68 (BP Location: Left Arm, Patient Position: Sitting, Cuff Size: Normal)   Pulse 88   Temp 98.7 F (37.1 C) (Oral)   Wt 182 lb 3.2 oz (82.6 kg)   SpO2 96%   BMI 25.77 kg/m    Physical Exam Constitutional:      General: He is not in acute distress.    Appearance: Normal appearance.  HENT:     Head: Normocephalic and atraumatic.     Nose: Nose normal.     Mouth/Throat:     Mouth: Mucous membranes are moist.  Cardiovascular:     Rate and Rhythm: Normal rate.     Heart sounds: No murmur heard.    No gallop.  Pulmonary:     Effort: Pulmonary effort is normal.  Abdominal:     General: Abdomen is flat. Bowel sounds are normal.     Palpations: Abdomen is soft.     Tenderness: There is abdominal tenderness in the epigastric area.  Comments: Mild TTP of epigastric area inferior to xiphoid process.  No pulsating masses appreciated.  Very minimal central area of distention with lifting head off exam table from laying down position. No TTP of RUQ, LUQ, RLQ, LLQ.  Skin:    General: Skin is warm and dry.  Neurological:     Mental Status: He is alert and oriented to person, place, and time.     No results found for any visits on 05/30/23.    Assessment & Plan:  Rectus diastasis  Fibromyalgia -     Ambulatory referral to Rheumatology  Arthralgia of multiple joints -     Ambulatory referral to Rheumatology  Gastroesophageal reflux disease, unspecified whether esophagitis present  New problem.  Discussed possible causes of acute soreness in epigastric area of abdomen including a very mild rectus diastases, GERD, hiatal hernia, muscle strain.  CT abdomen pelvis from 01/12/2023 reviewed and reassuring as largely negative.  A small fat-containing umbilical hernia was noted on the imaging  however not in the area of concern mentioned this visit.  Discussed supportive care.  New referral to [facility] and [facility] in [facility] placed for second opinion.  Patient also advised to schedule EGD with GI to evaluate for possible strictures or hiatal hernia.  Given precautions.  Return if symptoms worsen or fail to improve.   Deeann Saint, MD

## 2023-06-06 ENCOUNTER — Ambulatory Visit (INDEPENDENT_AMBULATORY_CARE_PROVIDER_SITE_OTHER): Payer: 59 | Admitting: *Deleted

## 2023-06-06 DIAGNOSIS — E538 Deficiency of other specified B group vitamins: Secondary | ICD-10-CM

## 2023-06-06 MED ORDER — CYANOCOBALAMIN 1000 MCG/ML IJ SOLN
1000.0000 ug | Freq: Once | INTRAMUSCULAR | Status: AC
Start: 2023-06-06 — End: 2023-06-06
  Administered 2023-06-06: 1000 ug via INTRAMUSCULAR

## 2023-06-06 NOTE — Progress Notes (Signed)
Per orders of Dr. Banks, injection of Cyanocobalamin 1000mcg given by Funderburk, Jo A. °Patient tolerated injection well. ° °

## 2023-06-18 ENCOUNTER — Other Ambulatory Visit (HOSPITAL_COMMUNITY): Payer: 59

## 2023-06-27 ENCOUNTER — Ambulatory Visit (HOSPITAL_COMMUNITY)
Admission: RE | Admit: 2023-06-27 | Discharge: 2023-06-27 | Disposition: A | Discharge status: Home / Self Care | Payer: 59 | Source: Ambulatory Visit | Attending: Gastroenterology | Admitting: Gastroenterology

## 2023-06-27 DIAGNOSIS — R899 Unspecified abnormal finding in specimens from other organs, systems and tissues: Secondary | ICD-10-CM | POA: Insufficient documentation

## 2023-06-27 DIAGNOSIS — K5900 Constipation, unspecified: Secondary | ICD-10-CM | POA: Diagnosis present

## 2023-06-27 DIAGNOSIS — K625 Hemorrhage of anus and rectum: Secondary | ICD-10-CM | POA: Diagnosis present

## 2023-06-27 DIAGNOSIS — K219 Gastro-esophageal reflux disease without esophagitis: Secondary | ICD-10-CM | POA: Insufficient documentation

## 2023-06-27 DIAGNOSIS — R109 Unspecified abdominal pain: Secondary | ICD-10-CM | POA: Diagnosis present

## 2023-06-27 DIAGNOSIS — R1310 Dysphagia, unspecified: Secondary | ICD-10-CM | POA: Insufficient documentation

## 2023-07-19 ENCOUNTER — Telehealth: Payer: Self-pay | Admitting: Family Medicine

## 2023-07-19 NOTE — Telephone Encounter (Signed)
Girlfriend [redacted] weeks pregnant, found out she has sickle cell, requests testing to see if he has it or is a carrier.

## 2023-07-25 ENCOUNTER — Other Ambulatory Visit: Payer: Self-pay | Admitting: Family Medicine

## 2023-07-25 DIAGNOSIS — Z130 Encounter for screening for diseases of the blood and blood-forming organs and certain disorders involving the immune mechanism: Secondary | ICD-10-CM

## 2023-07-25 NOTE — Telephone Encounter (Signed)
Will order labs for sickle cell screening.

## 2023-07-26 NOTE — Telephone Encounter (Signed)
Spoke with patient he is coming on 10/25 to have labs done

## 2023-07-27 ENCOUNTER — Other Ambulatory Visit: Payer: 59

## 2023-07-27 DIAGNOSIS — Z130 Encounter for screening for diseases of the blood and blood-forming organs and certain disorders involving the immune mechanism: Secondary | ICD-10-CM

## 2023-07-30 LAB — SICKLE CELL SCREEN: Sickle Solubility Test - HGBRFX: NEGATIVE

## 2023-07-31 ENCOUNTER — Ambulatory Visit: Payer: 59 | Attending: Obstetrics and Gynecology

## 2023-07-31 DIAGNOSIS — Z31440 Encounter of male for testing for genetic disease carrier status for procreative management: Secondary | ICD-10-CM

## 2023-07-31 NOTE — Addendum Note (Signed)
Addended by: Carolanne Grumbling on: 07/31/2023 10:45 AM   Modules accepted: Orders

## 2023-08-09 LAB — HORIZON CUSTOM: REPORT SUMMARY: POSITIVE — AB

## 2023-08-13 ENCOUNTER — Telehealth: Payer: Self-pay | Admitting: Obstetrics and Gynecology

## 2023-08-13 NOTE — Telephone Encounter (Signed)
I spoke with Marcus Bullock (dob [birth year]) and her partner, [name] (dob 1985[birth year]01[birth year]), to review the results of [facility] carrier screening for them both.  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  Additional testing for alpha hemoglobin variants, cystic fibrosis and spinal muscular atrophy were all negative.  These negative results greatly reduce but cannot eliminate the chance for her to be a carrier for these conditions. See that report for details and residual risk information.   Kyzen's results are negative for variants in the gene for beta hemoglobin, meaning that he is not a carrier for sickle cell, beta thalassemia, or other beta hemoglobinopathies.  His results did show him to be a silent carrier for alpha thalassemia, with one gene deletion.  He therefore has three normally functioning copies of the HBA1/HBA2 genes and is expected to be clinically normal (his MCV is 90).  For each of his children, he will either pass down two normal copies or one normal and one deleted copy for alpha globin production.  Because Marcus has normal results for alpha hemoglobin, their children are not at risk for a clinically significant alpha hemoglobinopathy. He was also negative for variants in the genes for cystic fibrosis and spinal muscular atrophy which greatly reduces his chance to have a child with those conditions.  Again, see the [facility] report for residual risk information.  Risks to this pregnancy:  We expect all of the children of Marcus and Raesean to be carriers for sickle cell trait (A/S) and for there to be a 50% chance for them to be a silent carrier of alpha thalassemia, neither of which is expected to be harmful to their wellbeing.  Plan of care: Marcus has been scheduled for a fetal anatomy ultrasound and MFM consultation to review her pregnancy management given this new diagnosis of SS in  herself. She has also been referred to [facility] for evaluation due to her lab results.  In addition, we presume that she also has hereditary persistence of fetal hemoglobin, as her hemoglobin F level is 31% and is likely the reason she has continued to be healthy. We stressed that Marcus has led a healthy and full life and we expect that to continue.   Cherly Anderson, MS, CGC

## 2023-10-02 ENCOUNTER — Encounter: Payer: Self-pay | Admitting: Family Medicine

## 2023-10-29 ENCOUNTER — Ambulatory Visit: Payer: Self-pay | Admitting: Family Medicine

## 2023-10-29 NOTE — Telephone Encounter (Signed)
Copied from CRM [id number]. Topic: Clinical - Red Word Triage >> Oct 29, 2023  9:51 AM Leavy Cella D wrote: Marcus Bullock that prompted transfer to Nurse Triage: severe headache , stomach pains and very fatigue   Chief Complaint: Headaches Symptoms: Headache, Abdominal Pain, Runny Nose, Cough Frequency: Since Friday Pertinent Negatives: Patient denies acute distress like symptoms.  Disposition: [] ED /[] Urgent Care (no appt availability in office) / [x] Appointment(In office/virtual)/ []  [place] [facility]/ [] Home Care/ [] Refused Recommended Disposition /[] [place] [facility]/ []  Follow-up with PCP Additional Notes: Marcus Bullock is a 38 year old male being triaged today for headaches and abdominal pain. The patient reports the symptoms being ongoing since Friday. Denies distress or a fever. In office appointment made for tomorrow.     Reason for Disposition  [1] MODERATE headache (e.g., interferes with normal activities) AND [2] present > 24 hours AND [3] unexplained  (Exceptions: analgesics not tried, typical migraine, or headache part of viral illness)  Answer Assessment - Initial Assessment Questions 1. LOCATION: "Where does it hurt?"      Right side, behind the eyes  2. ONSET: "When did the headache start?" (Minutes, hours or days)     Since Friday  3. PATTERN: "Does the pain come and go, or has it been constant since it started?"     Intermittent  4. SEVERITY: "How bad is the pain?" and "What does it keep you from doing?"  (e.g., Scale 1-10; mild, moderate, or severe)   - MILD (1-3): doesn't interfere with normal activities    - MODERATE (4-7): interferes with normal activities or awakens from sleep    - SEVERE (8-10): excruciating pain, unable to do any normal activities        5  5. RECURRENT SYMPTOM: "Have you ever had headaches before?" If Yes, ask: "When was the last time?" and "What happened that time?"      No  6. CAUSE: "What do you think is causing the headache?"     Unsure  7.  MIGRAINE: "Have you been diagnosed with migraine headaches?" If Yes, ask: "Is this headache similar?"      No  8. HEAD INJURY: "Has there been any recent injury to the head?"      No  9. OTHER SYMPTOMS: "Do you have any other symptoms?" (fever, stiff neck, eye pain, sore throat, cold symptoms)     Fatigue, Abdominal, Sore Throat, Runny Nose, Stiff Neck  Protocols used: Headache-A-AH

## 2023-10-30 ENCOUNTER — Ambulatory Visit: Payer: 59 | Admitting: Adult Health

## 2023-10-30 ENCOUNTER — Encounter: Payer: Self-pay | Admitting: Adult Health

## 2023-10-30 VITALS — BP 120/70 | HR 96 | Temp 98.6°F | Wt 187.0 lb

## 2023-10-30 DIAGNOSIS — J069 Acute upper respiratory infection, unspecified: Secondary | ICD-10-CM

## 2023-10-30 LAB — POCT INFLUENZA A/B
Influenza A, POC: NEGATIVE
Influenza B, POC: NEGATIVE

## 2023-10-30 LAB — POC COVID19 BINAXNOW: SARS Coronavirus 2 Ag: NEGATIVE

## 2023-10-30 LAB — POCT RAPID STREP A (OFFICE): Rapid Strep A Screen: NEGATIVE

## 2023-10-30 NOTE — Progress Notes (Signed)
[id number]

## 2023-10-30 NOTE — Progress Notes (Signed)
Subjective:    Patient ID: Marcus Bullock, male    DOB: 09-Oct-1985, 38 y.o.   MRN: [id number]  HPI  38 year old male who  has a past medical history of Anxiety, Back pain, Depression, Fibromyalgia, Left ankle sprain, Neck pain, Numbness and tingling, and Skin irritation.  He presents to the office today for an acute issue. He reports that for the last three days he has been experiencing pressure like headache, sore throat and chills. For the last day he has had loose stools and worsening fatigue.  He denies n/v/fevers.   He reports multiple family members with similar symptoms.   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    Social History   Socioeconomic History   Marital status: Single    Spouse name: Not on file   Number of children: 0   Years of education: college   Highest education level: Master's degree (e.g., MA, MS, MEng, MEd, MSW, MBA)  Occupational History   Occupation: 5th grade teacher  Tobacco Use   Smoking status: Never   Smokeless tobacco: Never  Vaping Use   Vaping status: Never Used  Substance and Sexual Activity   Alcohol use: Not Currently    Comment: Socially    Drug use: Not Currently   Sexual activity: Yes    Birth control/protection: None    Comment: male partner  Other Topics Concern   Not on file  Social History Narrative   Tourist information centre manager    Not married    No kids      Likes to workout and bowl.   Right-handed.   One cup tea per day.   Lives alone.      Social Drivers of Corporate investment banker Strain: Low Risk  (05/30/2023)   Overall Financial Resource Strain (CARDIA)    Difficulty of Paying Living Expenses: Not very hard  Food Insecurity: No Food  Insecurity (05/30/2023)   Hunger Vital Sign    Worried About Running Out of Food in the Last Year: Never true    Ran Out of Food in the Last Year: Never true  Transportation Needs: No Transportation Needs (05/30/2023)   PRAPARE - Administrator, Civil Service (Medical): No    Lack of Transportation (Non-Medical): No  Physical Activity: Insufficiently Active (05/30/2023)   Exercise Vital Sign    Days of Exercise per Week: 1 day    Minutes of Exercise per Session: 30 min  Stress: Stress Concern Present (05/30/2023)   [employer] of Occupational Health - Occupational Stress Questionnaire    Feeling of Stress : Rather much  Social Connections: Moderately Isolated (05/30/2023)   Social Connection and Isolation Panel [NHANES]    Frequency of Communication with Friends and Family: Three times a week    Frequency of Social Gatherings with Friends and Family: Three times a week    Attends Religious Services: More than 4 times per year    Active Member of Clubs or Organizations: No    Attends Banker Meetings: Not on file    Marital Status: Never  married  Intimate Partner Violence: Unknown (01/16/2023)   Received from [facility]   HITS    Physically Hurt: Not on file    Over the last 12 months how often did your partner insult you or talk down to you?: Rarely    Over the last 12 months how often did your partner threaten you with physical harm?: Never    Over the last 12 months how often did your partner scream or curse at you?: Never    Past Surgical History:  Procedure Laterality Date   No past surgery      Family History  Problem Relation Age of Onset   Thyroid disease Mother    Alcohol abuse Father    Ulcers Father    Heart disease Maternal Grandmother    Cancer Paternal Grandmother        breast   Liver disease Neg Hx    Esophageal cancer Neg Hx    Colon cancer Neg Hx     Allergies  Allergen Reactions   Bee Venom Swelling     No current outpatient medications on file prior to visit.   No current facility-administered medications on file prior to visit.    BP 120/70   Pulse 96   Temp 98.6 F (37 C)   Wt 187 lb (84.8 kg)   SpO2 98%   BMI 26.45 kg/m       Objective:   Physical Exam Vitals and nursing note reviewed.  Constitutional:      Appearance: Normal appearance.  HENT:     Nose: Rhinorrhea present. Rhinorrhea is clear.     Right Turbinates: Not enlarged or swollen.     Left Turbinates: Not enlarged or swollen.     Right Sinus: Frontal sinus tenderness present.     Left Sinus: Frontal sinus tenderness present.     Mouth/Throat:     Mouth: Mucous membranes are moist.     Pharynx: Oropharynx is clear. No pharyngeal swelling, oropharyngeal exudate or posterior oropharyngeal erythema.     Tonsils: No tonsillar exudate.  Cardiovascular:     Rate and Rhythm: Normal rate and regular rhythm.     Pulses: Normal pulses.     Heart sounds: Normal heart sounds.  Pulmonary:     Effort: Pulmonary effort is normal.     Breath sounds: Normal breath sounds.  Neurological:     General: No focal deficit present.     Mental Status: He is alert and oriented to person, place, and time.  Psychiatric:        Mood and Affect: Mood normal.        Behavior: Behavior normal.        Thought Content: Thought content normal.        Judgment: Judgment normal.       Assessment & Plan:  1. Viral URI (Primary) - Illness seems to be viral in nature and should start to resolved in the next few days.  - Can continue with Nyquil as needed - Advised claritin and flonase  - Stay hydrated and rest - If symptoms not resolving by day 10 then follow up in office  - POC COVID-19 BinaxNow- negative - POCT Influenza A/B- negative - POCT rapid strep A- negative   Shirline Frees, NP

## 2023-11-29 ENCOUNTER — Ambulatory Visit: Payer: 59 | Admitting: Gastroenterology

## 2023-12-07 ENCOUNTER — Encounter: Payer: Self-pay | Admitting: Adult Health

## 2024-02-15 ENCOUNTER — Ambulatory Visit: Payer: 59 | Admitting: Gastroenterology

## 2024-02-15 ENCOUNTER — Ambulatory Visit: Payer: Self-pay | Admitting: Gastroenterology

## 2024-02-15 ENCOUNTER — Other Ambulatory Visit (INDEPENDENT_AMBULATORY_CARE_PROVIDER_SITE_OTHER)

## 2024-02-15 ENCOUNTER — Encounter: Payer: Self-pay | Admitting: Gastroenterology

## 2024-02-15 VITALS — BP 110/60 | HR 75 | Ht 71.0 in | Wt 186.0 lb

## 2024-02-15 DIAGNOSIS — R7989 Other specified abnormal findings of blood chemistry: Secondary | ICD-10-CM | POA: Diagnosis not present

## 2024-02-15 DIAGNOSIS — M549 Dorsalgia, unspecified: Secondary | ICD-10-CM | POA: Diagnosis not present

## 2024-02-15 DIAGNOSIS — R899 Unspecified abnormal finding in specimens from other organs, systems and tissues: Secondary | ICD-10-CM

## 2024-02-15 DIAGNOSIS — K5900 Constipation, unspecified: Secondary | ICD-10-CM

## 2024-02-15 DIAGNOSIS — R0789 Other chest pain: Secondary | ICD-10-CM

## 2024-02-15 DIAGNOSIS — G8929 Other chronic pain: Secondary | ICD-10-CM

## 2024-02-15 DIAGNOSIS — G894 Chronic pain syndrome: Secondary | ICD-10-CM

## 2024-02-15 DIAGNOSIS — M7918 Myalgia, other site: Secondary | ICD-10-CM

## 2024-02-15 LAB — HEPATIC FUNCTION PANEL
ALT: 22 U/L (ref 0–53)
AST: 20 U/L (ref 0–37)
Albumin: 4.4 g/dL (ref 3.5–5.2)
Alkaline Phosphatase: 40 U/L (ref 39–117)
Bilirubin, Direct: 0.2 mg/dL (ref 0.0–0.3)
Total Bilirubin: 0.7 mg/dL (ref 0.2–1.2)
Total Protein: 7.1 g/dL (ref 6.0–8.3)

## 2024-02-15 MED ORDER — POLYETHYLENE GLYCOL 3350 17 G PO PACK: 17.0000 g | PACK | Freq: Every day | ORAL | Status: DC | PRN

## 2024-02-15 NOTE — Progress Notes (Signed)
 HPI :  38 year old male here for follow-up visit for chronic abdominal pain.  Recall I saw him back about a year ago for some of these issues.  Recall he has had some chronic pain in his flanks bilaterally, mid to upper back, and chest tightness for the past several years.  He feels some pain in his flank all the time, previously was more so on the right but he has some chronic tenderness in the left flank as well.  It is reproducible, tenderness to palpation.  He thinks it can be somewhat positional.  He has a new baby girl, holding her for periods of time or standing for periods of time typically makes this worse.  It does improve with taking ibuprofen  which she takes as needed but not routinely.  He denies any trauma to the area.  He has some mild constipation but bowel movements do not provide any reliable relief.  He has tried some fiber supplements in the past but has not used it reliably.  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  He has been working out the gym more frequently, he does think it can help in general.  He is also have some intermittent chest tightness that can come and go.  No dyspnea but sometimes when he exerts himself such as playing basketball he can feel this.  He denies any GERD or pyrosis.  No nausea or vomiting, weight stable.  He has had a pretty extensive evaluation for the symptoms over the years.  Since 2021 he has had 3 CT scans of his abdomen pelvis for these particular issues which failed to show a clear cause for his symptoms.  He had an EGD and colonoscopy with Dr. Honey Lusty which did not show any problems, results as below.  He has seen a cardiologist, had an exercise treadmill test and an echocardiogram, both of which were normal.  He has seen a rheumatologist and was told he had fibromyalgia.  He has had massage therapy and acupuncture in the past and states these do seem to help but is  rather short-lived.  He had a weeks worth of naltrexone therapy and did not think it helped, does not like taking muscle relaxers as they make him sleepy.  It has been recommended that he try Cymbalta  or Pamelor, he is not had a good trial of either of these, sounds like he is only taking a few pills of Cymbalta .  Generally does not like taking medications.  He is also had MRIs of his C-spine, T-spine, and L-spine for chronic pain and has seen orthopedics in the past.  He does have some degenerative changes in his thoracic spine.  Of note his father was diagnosed with pancreatic cancer at age 38 or 64.  He has had a normal pancreas on imaging to date.   Otherwise, recall he has had lab work done by rheumatology which showed a mildly positive smooth muscle antibody but ANA and IgG level were normal and his LFTs have been normal.  We had discussed doing follow-up LFTs to make sure stable over time.  Prior workup: EGD 06/28/21 - Dr. Honey Lusty - gastritis, z line regular, normal duodenum, small HH - biopsies of small bowel and stomach negative   Colonoscopy 06/28/21 - Dr. Honey Lusty - 2mm polyp removed, diverticulosis, hemorrhoids - benign path, lymphoid aggregate   Barium study 06/27/23 -  FINDINGS: Normal swallowing  mechanism. No esophageal fold thickening, stricture or obstruction. Normal esophageal motility. Tiny hiatal hernia. A 13 mm barium tablet passed readily into the stomach.   IMPRESSION: Tiny hiatal hernia.   CT scan abdomen pelvis 01-12-2023 -no acute pathology seen, normal pancreas  2 CT scans in 2021 normal  Past Medical History:  Diagnosis Date   Anxiety    B12 deficiency    Back pain    Chronic pain syndrome    Depression    Fibromyalgia    GERD (gastroesophageal reflux disease)    Left ankle sprain    Neck pain    Numbness and tingling    OSA (obstructive sleep apnea)    Polyarthralgia    Rectus diastasis    Skin irritation    has seen derm and allergist - dx  cholinergic urticaria     Past Surgical History:  Procedure Laterality Date   ESOPHAGOGASTRODUODENOSCOPY     Family History  Problem Relation Age of Onset   Thyroid disease Mother    Alcohol abuse Father    Ulcers Father    Heart disease Maternal Grandmother    Cancer Paternal Grandmother        breast   Liver disease Neg Hx    Esophageal cancer Neg Hx    Colon cancer Neg Hx    Social History   Tobacco Use   Smoking status: Never   Smokeless tobacco: Never  Vaping Use   Vaping status: Never Used  Substance Use Topics   Alcohol use: Not Currently    Comment: Socially    Drug use: Not Currently   Current Outpatient Medications  Medication Sig Dispense Refill   ibuprofen  (ADVIL ) 600 MG tablet Take 600 mg by mouth every 6 (six) hours as needed.     No current facility-administered medications for this visit.   Allergies  Allergen Reactions   Bee Venom Swelling     Review of Systems: All systems reviewed and negative except where noted in HPI.   Lab Results  Component Value Date   WBC 6.4 04/16/2023   HGB 14.4 04/16/2023   HCT 44.4 04/16/2023   MCV 90.0 04/16/2023   PLT 232.0 04/16/2023    Lab Results  Component Value Date   NA 138 04/16/2023   CL 103 04/16/2023   K 4.0 04/16/2023   CO2 28 04/16/2023   BUN 9 04/16/2023   CREATININE 1.12 04/16/2023   GFR 83.73 04/16/2023   CALCIUM 9.9 04/16/2023   ALBUMIN 4.5 04/16/2023   GLUCOSE 88 04/16/2023    Lab Results  Component Value Date   ALT 15 04/16/2023   AST 16 04/16/2023   ALKPHOS 41 04/16/2023   BILITOT 0.7 04/16/2023     Physical Exam: BP 110/60   Pulse 75   Ht 5\' 11"  (1.803 m)   Wt 186 lb (84.4 kg)   BMI 25.94 kg/m  Constitutional: Pleasant,well-developed, male in no acute distress. Abdominal: Soft, nondistended, focal tenderness to R and L flanks.   There are no masses palpable. No hepatomegaly. Extremities: no edema Lymphadenopathy: No cervical adenopathy noted. Neurological:  Alert and oriented to person place and time. Skin: Skin is warm and dry. No rashes noted. Psychiatric: Normal mood and affect. Behavior is normal.   ASSESSMENT: 38 y.o. male here for assessment of the following  1. Chronic pain syndrome   2. Musculoskeletal pain   3. Chronic back pain, unspecified back location, unspecified back pain laterality   4. Chest tightness   5. Abnormal  laboratory test    Extensive workup for chronic abdominal, back pain as well as intermittent chest tightness.  I reassured him of his workup to date which includes 3 CT scans abdomen pelvis, MRI of his entire back, EGD, colonoscopy, stress test, echocardiogram.  I do not think we are missing anything concerning in regards to malignancy or worrisome process.  The pain in his flanks is quite reproducible, tender, this is very consistent with musculoskeletal pain.  I again reassured him of the chronicity of his symptoms that are stable over time with normal labs and imaging etc.  We discussed long-term plan.  It depends on how much this bothers him in regards to what he wants to do about it, as he does not wish to take medication.  However, I do think a daily medicine to help prevent symptoms is probably in his best interest.  Specifically I do think Cymbalta  is a reasonable choice.  He already has this but has not really tried taking it yet with any consistency.  Recommend he take this once daily for at least 4 weeks to assess tolerance and for benefit.  Would ideally try this for a few months and see how he feels before making a decision if he wants to continue it or not.  I otherwise offered him a referral to see sports medicine for their opinion on his flank pain, see if he is a candidate for injection or ultrasound therapy.  He has had a cardiac workup already for his chest tightness which was negative, they have not seen him in a few years, he can call them to follow-up as needed if he would like more reassurance about  that.  Recommend MiraLAX  as needed for constipation if he needs to take something.  He is due for colonoscopy in 2032 for screening purposes  I do not think he has autoimmune hepatitis given his workup to date, just a mildly positive smooth muscle antibody with negative IgG and ANA, but would like to repeat his LFTs to make sure stable.  He is agreeable to go to the lab today.  He can keep me posted on his progress and any questions in the interim.   PLAN: - start cymbalta  once month trial - 30 day trial at least, every day - discussed risks / benefits, he already has a prescription for this - refer to sports medicine for chronic flank / back pain evaluation - Miralax  PRN - lab for LFTs today - make sure stable - he can follow up with cardiology PRN, reassured him of prior workup - recall colonoscopy Sept 11/2030  Christi Coward, MD [facility]

## 2024-02-15 NOTE — Patient Instructions (Signed)
 Please go to the lab in the basement of our building to have lab work done as you leave today. Hit "B" for basement when you get on the elevator.  When the doors open the lab is on your left.  We will call you with the results. Thank you.   Take Cymbalta  as prescribed for a one month trial.  Please purchase the following medications over the counter and take as directed: Miralax    You will be due for a recall colonoscopy in 06-2031. We will send you a reminder in the mail when it gets closer to that time.  Thank you for entrusting me with your care and for choosing [facility], Dr. Alvester Johnson    If your blood pressure at your visit was 140/90 or greater, please contact your primary care physician to follow up on this. ______________________________________________________  If you are age 18 or older, your body mass index should be between 23-30. Your Body mass index is 25.94 kg/m. If this is out of the aforementioned range listed, please consider follow up with your Primary Care Provider.  If you are age 17 or younger, your body mass index should be between 19-25. Your Body mass index is 25.94 kg/m. If this is out of the aformentioned range listed, please consider follow up with your Primary Care Provider.  ________________________________________________________  The [facility] providers would like to encourage you to use MYCHART to communicate with providers for non-urgent requests or questions.  Due to long hold times on the telephone, sending your provider a message by [facility] may be a faster and more efficient way to get a response.  Please allow 48 business hours for a response.  Please remember that this is for non-urgent requests.  _______________________________________________________  Due to recent changes in healthcare laws, you may see the results of your imaging and laboratory studies on MyChart before your provider has had a chance to review them.  We  understand that in some cases there may be results that are confusing or concerning to you. Not all laboratory results come back in the same time frame and the provider may be waiting for multiple results in order to interpret others.  Please give us  48 hours in order for your provider to thoroughly review all the results before contacting the office for clarification of your results.

## 2024-02-21 NOTE — Progress Notes (Signed)
 Cardiology Office Note:  .   Date:  03/03/2024  ID:  NYLEN CREQUE, DOB Mar 01, 1986, MRN [id number] PCP: Viola Greulich, MD  [facility] Providers Cardiologist:  None    History of Present Illness: .   CRIAG WICKLUND is a 38 y.o. male with history of fibromyalgia who saw Dr. Alda Amas 2022 for chest pain and palpitations. GXT and echo normal. Monitor never resulted.  Patient says chest tightness has gotten worse in the past few months. Occurs at rest, no worse with exertion. He swims, light weights, cardio for 15 min, sauna. Just had a baby so hasn't been doing as much. No pain with exertion. No pain with deep inspiration and pain to touch left breast . Diagnosed with fibromyalgia and hurts all over.   ROS:    Studies Reviewed: Aaron Aas    EKG Interpretation Date/Time:  Monday March 03 2024 13:50:13 EDT Ventricular Rate:  97 PR Interval:  130 QRS Duration:  98 QT Interval:  342 QTC Calculation: 434 R Axis:   64  Text Interpretation: Normal sinus rhythm  ST elevation, consider early repolarization When compared with ECG of 12-Jan-2023 14:24, PREVIOUS ECG IS PRESENT similar to EKG 2023. reviewed with Dr. Abel Hoe  Confirmed by Theotis Flake [phone]) on 03/03/2024 2:24:22 PM    Prior CV Studies:   Echo 2022 IMPRESSIONS     1. Left ventricular ejection fraction, by estimation, is 60 to 65%. Left  ventricular ejection fraction by 3D volume is 63 %. The left ventricle has  normal function. The left ventricle has no regional wall motion  abnormalities. Left ventricular diastolic   parameters were normal.   2. Right ventricular systolic function is normal. The right ventricular  size is normal. There is normal pulmonary artery systolic pressure. The  estimated right ventricular systolic pressure is 19.0 mmHg.   3. The mitral valve is normal in structure. No evidence of mitral valve  regurgitation. No evidence of mitral stenosis.   4. The aortic valve is normal in  structure. Aortic valve regurgitation is  not visualized. No aortic stenosis is present.   5. The inferior vena cava is normal in size with greater than 50%  respiratory variability, suggesting right atrial pressure of 3 mmHg.   GXT 2022 Blood pressure demonstrated a normal response to exercise. There was no ST segment deviation noted during stress. No T wave inversion was noted during stress. Negative, adequate stress test.     Risk Assessment/Calculations:             Physical Exam:   VS:  BP 112/76 (BP Location: Left Arm, Patient Position: Sitting, Cuff Size: Normal)   Pulse 98   Ht 5\' 11"  (1.803 m)   Wt 181 lb 9.6 oz (82.4 kg)   SpO2 99%   BMI 25.33 kg/m    Wt Readings from Last 3 Encounters:  03/03/24 181 lb 9.6 oz (82.4 kg)  02/15/24 186 lb (84.4 kg)  10/30/23 187 lb (84.8 kg)    GEN: Well nourished, well developed in no acute distress NECK: No JVD; No carotid bruits CARDIAC:  RRR, no murmurs, rubs, gallops RESPIRATORY:  Clear to auscultation without rales, wheezing or rhonchi  ABDOMEN: Soft, non-tender, non-distended EXTREMITIES:  No edema; No deformity   ASSESSMENT AND PLAN: .    History of chest pain normal GXT and echo 2022. EKG with chronic early repolarization changes-reviewed with DR. McAlhnay who concurs. Chest pain at rest, no exertional symptoms, no recent viral illness/fever, no  pain with inspiration or laying down. Has pain to touch also-diagnosed with fibromyalgia. Will order coronary CTA.   History of palpitations-no recent symptoms  Fibromyalgia with M-S pain.     Informed Consent   Shared Decision Making/Informed Consent{       Dispo: f/u pending above testing.  Signed, Theotis Flake, PA-C

## 2024-03-03 ENCOUNTER — Ambulatory Visit: Attending: Physician Assistant | Admitting: Physician Assistant

## 2024-03-03 ENCOUNTER — Encounter: Payer: Self-pay | Admitting: Physician Assistant

## 2024-03-03 VITALS — BP 112/76 | HR 98 | Ht 71.0 in | Wt 181.6 lb

## 2024-03-03 DIAGNOSIS — M797 Fibromyalgia: Secondary | ICD-10-CM

## 2024-03-03 DIAGNOSIS — R0789 Other chest pain: Secondary | ICD-10-CM

## 2024-03-03 DIAGNOSIS — R002 Palpitations: Secondary | ICD-10-CM

## 2024-03-03 MED ORDER — METOPROLOL TARTRATE 50 MG PO TABS: 50.0000 mg | ORAL_TABLET | Freq: Once | ORAL | 0 refills | Status: DC

## 2024-03-03 NOTE — Patient Instructions (Addendum)
 Medication Instructions:  Your physician recommends that you continue on your current medications as directed. Please refer to the Current Medication list given to you today.  *If you need a refill on your cardiac medications before your next appointment, please call your pharmacy*  Lab Work: TODAY: BMET If you have labs (blood work) drawn today and your tests are completely normal, you will receive your results only by: MyChart Message (if you have MyChart) OR A paper copy in the mail If you have any lab test that is abnormal or we need to change your treatment, we will call you to review the results.  Testing/Procedures:   Your cardiac CT will be scheduled at one of the below locations:   Jeralene Mom. [facility] and Vascular Tower [address]  [place], [place] [address] Opening January 28, 2024  If scheduled at [facility], please arrive at the [facility] En[facility]ance (Entrance C2) of [facility] 30 minutes prior to test start time. You can use the FREE valet parking offered at entrance C (encouraged to control the heart rate for the test)  Proceed to the [facility] Radiology Department (first floor) to check-in and test prep.   All radiology patients and guests should use entrance C2 at [facility], accessed from [facility], even though the hospital's physical address listed is [address]    If scheduled at the Heart and Vascular Tower at [address], please enter the parking lot using the [address] entrance and use the FREE valet service at the patient drop-off area. Enter the buidling and check-in with registration on the main floor.  Please follow these instructions carefully (unless otherwise directed):  An IV will be required for this test and Nitroglycerin will be given.  Hold all erectile dysfunction medications at least 3 days (72 hrs) prior to test. (Ie viagra, cialis, sildenafil, tadalafil, etc)   On  the Night Before the Test: Be sure to Drink plenty of water. Do not consume any caffeinated/decaffeinated beverages or chocolate 12 hours prior to your test. Do not take any antihistamines 12 hours prior to your test.  On the Day of the Test: Drink plenty of water until 1 hour prior to the test. Do not eat any food 1 hour prior to test. You may take your regular medications prior to the test.  Take metoprolol (Lopressor) 50 MG two hours prior to test. If you take Furosemide/Hydrochlorothiazide/Spironolactone/Chlorthalidone, please HOLD on the morning of the test. Patients who wear a continuous glucose monitor MUST remove the device prior to scanning.  After the Test: Drink plenty of water. After receiving IV contrast, you may experience a mild flushed feeling. This is normal. On occasion, you may experience a mild rash up to 24 hours after the test. This is not dangerous. If this occurs, you can take Benadryl 25 mg, Zyrtec, Claritin, or Allegra and increase your fluid intake. (Patients taking Tikosyn should avoid Benadryl, and may take Zyrtec, Claritin, or Allegra) If you experience trouble breathing, this can be serious. If it is severe call 911 IMMEDIATELY. If it is mild, please call our office.  We will call to schedule your test 2-4 weeks out understanding that some insurance companies will need an authorization prior to the service being performed.   For more information and frequently asked questions, please visit our website : [email or web address]  For non-scheduling related questions, please contact the cardiac imaging nurse navigator should you have any questions/concerns: Cardiac Imaging  Nurse Comptroller Dial: [phone]   For scheduling needs, including cancellations and rescheduling, please call [place], [phone].   Follow-Up: At [facility], you and your health needs are our priority.  As part of our continuing mission to provide  you with exceptional heart care, our providers are all part of one team.  This team includes your primary Cardiologist (physician) and Advanced Practice Providers or APPs (Physician Assistants and Nurse Practitioners) who all work together to provide you with the care you need, when you need it.  Your next appointment:   BASED ON TEST RESULTS   We recommend signing up for the patient portal called "MyChart".  Sign up information is provided on this After Visit Summary.  MyChart is used to connect with patients for Virtual Visits (Telemedicine).  Patients are able to view lab/test results, encounter notes, upcoming appointments, etc.  Non-urgent messages can be sent to your provider as well.   To learn more about what you can do with MyChart, go to [email or web address].

## 2024-03-04 ENCOUNTER — Ambulatory Visit: Payer: Self-pay | Admitting: Physician Assistant

## 2024-03-04 LAB — BASIC METABOLIC PANEL WITH GFR
BUN/Creatinine Ratio: 10 (ref 9–20)
BUN: 10 mg/dL (ref 6–20)
CO2: 24 mmol/L (ref 20–29)
Calcium: 9.2 mg/dL (ref 8.7–10.2)
Chloride: 105 mmol/L (ref 96–106)
Creatinine, Ser: 1.05 mg/dL (ref 0.76–1.27)
Glucose: 65 mg/dL — ABNORMAL LOW (ref 70–99)
Potassium: 4.4 mmol/L (ref 3.5–5.2)
Sodium: 142 mmol/L (ref 134–144)
eGFR: 93 mL/min/{1.73_m2} (ref >59)

## 2024-04-15 ENCOUNTER — Telehealth: Payer: Self-pay | Admitting: Cardiology

## 2024-04-15 DIAGNOSIS — R0789 Other chest pain: Secondary | ICD-10-CM

## 2024-04-15 NOTE — Telephone Encounter (Signed)
 Spoke to patient advised after reviewing your chart coronary ct order not placed.I put in orders.Stated he has misplaced his instructions.Instructions will be mailed to your home.Advised Metoprolol  50 mg take 2 hours before coronary ct.Prescription already sent to your pharmacy.

## 2024-04-15 NOTE — Telephone Encounter (Signed)
  Per MyChart Scheduling message:  I had an appointment and we scheduled some test of my heart but I have not been contacted to schedule this. Please advise.   Patient saw Rosaline Pavy on 03/03/24 and in the visit summary she states that she will order a Coronary CTA. I do not see that orders were ever placed. Please advise.

## 2024-04-18 ENCOUNTER — Encounter (HOSPITAL_COMMUNITY): Payer: Self-pay

## 2024-04-21 ENCOUNTER — Telehealth (HOSPITAL_COMMUNITY): Payer: Self-pay | Admitting: *Deleted

## 2024-04-21 ENCOUNTER — Other Ambulatory Visit (HOSPITAL_COMMUNITY): Payer: Self-pay | Admitting: *Deleted

## 2024-04-21 MED ORDER — METOPROLOL TARTRATE 50 MG PO TABS: 50.0000 mg | ORAL_TABLET | Freq: Once | ORAL | 0 refills | Status: AC

## 2024-04-21 NOTE — Telephone Encounter (Signed)
 Reaching out to patient to offer assistance regarding upcoming cardiac imaging study; pt verbalizes understanding of appt date/time, parking situation and where to check in, pre-test NPO status and medications ordered, and verified current allergies; name and call back number provided for further questions should they arise  Larey Brick RN Navigator Cardiac Imaging [facility] [phone] office [phone] cell  Patient to take 50mg  metoprolol tartrate two hours prior to his cardiac CT scan.

## 2024-04-21 NOTE — Telephone Encounter (Signed)
 Attempted to call patient regarding upcoming cardiac CT appointment. Left message on voicemail with name and callback number  Larey Brick RN Navigator Cardiac Imaging [facility] [phone] Office [phone] Cell

## 2024-04-22 ENCOUNTER — Ambulatory Visit (HOSPITAL_COMMUNITY)
Admission: RE | Admit: 2024-04-22 | Discharge: 2024-04-22 | Disposition: A | Discharge status: Home / Self Care | Source: Ambulatory Visit | Attending: Physician Assistant | Admitting: Physician Assistant

## 2024-04-22 DIAGNOSIS — N62 Hypertrophy of breast: Secondary | ICD-10-CM | POA: Insufficient documentation

## 2024-04-22 DIAGNOSIS — R0789 Other chest pain: Secondary | ICD-10-CM | POA: Diagnosis present

## 2024-04-22 MED ORDER — IOHEXOL 350 MG/ML SOLN
100.0000 mL | Freq: Once | INTRAVENOUS | Status: AC | PRN
  Administered 2024-04-22: 100 mL via INTRAVENOUS

## 2024-04-22 MED ORDER — NITROGLYCERIN 0.4 MG SL SUBL
0.8000 mg | SUBLINGUAL_TABLET | Freq: Once | SUBLINGUAL | Status: AC
  Administered 2024-04-22: 0.8 mg via SUBLINGUAL

## 2024-04-24 ENCOUNTER — Ambulatory Visit: Payer: Self-pay | Admitting: Physician Assistant
